# Patient Record
Sex: Male | Born: 1935
Health system: Southern US, Community
[De-identification: ages and names within clinical notes are randomized; demographics above are authoritative.]

## PROBLEM LIST (undated history)

## (undated) DIAGNOSIS — I10 Essential (primary) hypertension: Secondary | ICD-10-CM

## (undated) DIAGNOSIS — C801 Malignant (primary) neoplasm, unspecified: Secondary | ICD-10-CM

## (undated) DIAGNOSIS — F0390 Unspecified dementia without behavioral disturbance: Secondary | ICD-10-CM

## (undated) HISTORY — DX: Malignant (primary) neoplasm, unspecified: C80.1

## (undated) HISTORY — PX: TOTAL KNEE ARTHROPLASTY: SHX125

## (undated) HISTORY — DX: Essential (primary) hypertension: I10

---
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
## 1998-05-18 ENCOUNTER — Encounter: Payer: Self-pay | Admitting: Family Medicine

## 2004-08-11 ENCOUNTER — Ambulatory Visit: Payer: Self-pay | Admitting: Family Medicine

## 2005-08-02 ENCOUNTER — Ambulatory Visit: Payer: Self-pay | Admitting: Family Medicine

## 2005-10-17 ENCOUNTER — Ambulatory Visit: Payer: Self-pay | Admitting: Family Medicine

## 2005-10-19 ENCOUNTER — Ambulatory Visit: Payer: Self-pay | Admitting: Family Medicine

## 2005-11-01 ENCOUNTER — Ambulatory Visit: Payer: Self-pay | Admitting: Family Medicine

## 2006-01-21 ENCOUNTER — Ambulatory Visit: Payer: Self-pay | Admitting: Family Medicine

## 2006-01-21 LAB — CONVERTED CEMR LAB: PSA: 5.58 ng/mL

## 2006-08-08 ENCOUNTER — Ambulatory Visit: Payer: Self-pay | Admitting: Family Medicine

## 2007-07-09 ENCOUNTER — Encounter: Payer: Self-pay | Admitting: Family Medicine

## 2007-07-14 ENCOUNTER — Ambulatory Visit: Payer: Self-pay | Admitting: General Practice

## 2007-07-14 ENCOUNTER — Other Ambulatory Visit: Payer: Self-pay

## 2007-07-28 ENCOUNTER — Encounter: Payer: Self-pay | Admitting: Family Medicine

## 2007-07-28 ENCOUNTER — Inpatient Hospital Stay: Payer: Self-pay | Admitting: General Practice

## 2007-07-30 ENCOUNTER — Encounter: Payer: Self-pay | Admitting: Family Medicine

## 2007-07-30 ENCOUNTER — Other Ambulatory Visit: Payer: Self-pay

## 2007-08-02 ENCOUNTER — Emergency Department: Payer: Self-pay | Admitting: Emergency Medicine

## 2007-08-05 ENCOUNTER — Ambulatory Visit: Payer: Self-pay | Admitting: Family Medicine

## 2007-08-05 DIAGNOSIS — R55 Syncope and collapse: Secondary | ICD-10-CM

## 2008-04-07 ENCOUNTER — Encounter: Payer: Self-pay | Admitting: Family Medicine

## 2008-04-14 ENCOUNTER — Ambulatory Visit: Payer: Self-pay | Admitting: Family Medicine

## 2008-04-14 ENCOUNTER — Encounter: Payer: Self-pay | Admitting: Family Medicine

## 2008-04-14 DIAGNOSIS — Z8546 Personal history of malignant neoplasm of prostate: Secondary | ICD-10-CM

## 2008-04-14 DIAGNOSIS — E7800 Pure hypercholesterolemia, unspecified: Secondary | ICD-10-CM

## 2008-04-14 LAB — CONVERTED CEMR LAB
Alkaline Phosphatase: 62 units/L (ref 39–117)
Bilirubin, Direct: 0.1 mg/dL (ref 0.0–0.3)
Calcium: 9.1 mg/dL (ref 8.4–10.5)
GFR calc Af Amer: 140 mL/min
GFR calc non Af Amer: 116 mL/min
Glucose, Bld: 98 mg/dL (ref 70–99)
HDL: 28.7 mg/dL — ABNORMAL LOW (ref >39.0)
LDL Cholesterol: 110 mg/dL — ABNORMAL HIGH (ref 0–99)
Potassium: 4.1 meq/L (ref 3.5–5.1)
Sodium: 142 meq/L (ref 135–145)
Total Bilirubin: 0.8 mg/dL (ref 0.3–1.2)
Total CHOL/HDL Ratio: 5.5
VLDL: 19 mg/dL (ref 0–40)

## 2008-04-19 ENCOUNTER — Ambulatory Visit: Payer: Self-pay | Admitting: Family Medicine

## 2008-05-06 ENCOUNTER — Ambulatory Visit: Payer: Self-pay | Admitting: Family Medicine

## 2008-05-06 LAB — CONVERTED CEMR LAB
OCCULT 1: NEGATIVE
OCCULT 2: NEGATIVE
OCCULT 3: NEGATIVE

## 2008-05-25 ENCOUNTER — Ambulatory Visit: Payer: Self-pay | Admitting: Family Medicine

## 2008-05-25 DIAGNOSIS — I10 Essential (primary) hypertension: Secondary | ICD-10-CM

## 2008-08-23 ENCOUNTER — Ambulatory Visit: Payer: Self-pay | Admitting: Family Medicine

## 2009-04-28 ENCOUNTER — Encounter: Payer: Self-pay | Admitting: Family Medicine

## 2010-05-09 ENCOUNTER — Encounter (INDEPENDENT_AMBULATORY_CARE_PROVIDER_SITE_OTHER): Payer: Self-pay | Admitting: *Deleted

## 2010-05-25 ENCOUNTER — Encounter: Payer: Self-pay | Admitting: Family Medicine

## 2010-08-14 ENCOUNTER — Emergency Department: Payer: Self-pay | Admitting: Emergency Medicine

## 2010-08-14 ENCOUNTER — Encounter: Payer: Self-pay | Admitting: Family Medicine

## 2010-08-21 ENCOUNTER — Encounter: Payer: Self-pay | Admitting: Family Medicine

## 2010-08-29 ENCOUNTER — Ambulatory Visit: Payer: Self-pay | Admitting: Urology

## 2010-09-15 ENCOUNTER — Encounter: Payer: Self-pay | Admitting: Family Medicine

## 2010-10-09 ENCOUNTER — Ambulatory Visit: Payer: Self-pay | Admitting: Gastroenterology

## 2010-11-01 NOTE — Letter (Signed)
Summary: [facility]   Imported By: Lanelle Bal 08/31/2010 12:01:02  _____________________________________________________________________  External Attachment:    Type:   Image     Comment:   External Document

## 2010-11-01 NOTE — Letter (Signed)
Summary: Nadara Eaton letter  [place] at [facility]  [address] [place], [place] [address]   Phone: [phone]  Fax: [phone]       05/09/2010 MRN: [id number]  ALLEX LAPOINT [address] [place], [place]  [address]  Dear Mr. GREENFELD,  [facility] [place], and [facility] announce the retirement of Arta Silence, M.D., from full-time practice at the [facility] office effective March 30, 2010 and his plans of returning part-time.  It is important to Dr. Hetty Ely and to our practice that you understand that [facility] has seven physicians in our office for your health care needs.  We will continue to offer the same exceptional care that you have today.    Dr. Hetty Ely has spoken to many of you about his plans for retirement and returning part-time in the fall.   We will continue to work with you through the transition to schedule appointments for you in the office and meet the high standards that [place] is committed to.   Again, it is with great pleasure that we share the news that Dr. Hetty Ely will return to [facility] in October of 2011 with a reduced schedule.    If you have any questions, or would like to request an appointment with one of our physicians, please call us at [phone] and press the option for Scheduling an appointment.  We take pleasure in providing you with excellent patient care and look forward to seeing you at your next office visit.  Our [facility] Physicians are:  Tillman Abide, M.D. Laurita Quint, M.D. Roxy Manns, M.D. Kerby Nora, M.D. Hannah Beat, M.D. Ruthe Mannan, M.D. We proudly welcomed Raechel Ache, M.D. and Eustaquio Boyden, M.D. to the practice in July/August 2011.  Sincerely,  [facility]

## 2010-11-01 NOTE — Letter (Signed)
Summary: [facility]  [facility]   Imported By: Lanelle Bal 06/01/2010 12:01:27  _____________________________________________________________________  External Attachment:    Type:   Image     Comment:   External Document  Appended Document: [facility]    Clinical Lists Changes  Observations: Added new observation of PAST MED HX: elevated PSA per Dr. Achilles Dunk with uro (06/01/2010 22:03)       Past History:  Past Medical History: elevated PSA per Dr. Achilles Dunk with uro

## 2010-11-01 NOTE — Letter (Signed)
Summary: [facility]  [facility]   Imported By: Lester Stella 08/23/2010 12:40:31  _____________________________________________________________________  External Attachment:    Type:   Image     Comment:   External Document

## 2010-11-02 NOTE — Letter (Signed)
Summary: [facility]  [facility]   Imported By: Lanelle Bal 09/26/2010 13:07:23  _____________________________________________________________________  External Attachment:    Type:   Image     Comment:   External Document  Appended Document: [facility]    Clinical Lists Changes  Problems: Added new problem of ADENOCARCINOMA, PROSTATE (DR COPE) (ICD-185) Observations: Added new observation of PAST MED HX: elevated PSA per Dr. Achilles Dunk (09/26/2010 20:43) Added new observation of PAST SURG HX: EVENT MONITOR--NORMAL:(10/1998) R KNEE REPLACEMENT (DR HOOTEN) 07/2008 PROSTATE  BIOPSY POS (DR COPE) (08/14/2010) (09/26/2010 20:43)       Past Medical History:    elevated PSA per Dr. Achilles Dunk  Past Surgical History:    EVENT MONITOR--NORMAL:(10/1998)    R KNEE REPLACEMENT (DR HOOTEN) 07/2008    PROSTATE  BIOPSY POS (DR COPE) (08/14/2010)

## 2010-11-28 ENCOUNTER — Ambulatory Visit: Payer: Self-pay | Admitting: Urology

## 2010-11-28 DIAGNOSIS — C801 Malignant (primary) neoplasm, unspecified: Secondary | ICD-10-CM

## 2010-11-28 HISTORY — DX: Malignant (primary) neoplasm, unspecified: C80.1

## 2010-12-04 ENCOUNTER — Encounter: Payer: Self-pay | Admitting: Family Medicine

## 2010-12-12 NOTE — Letter (Signed)
Summary: [facility]  [facility]   Imported By: Maryln Gottron 12/07/2010 15:39:55  _____________________________________________________________________  External Attachment:    Type:   Image     Comment:   External Document

## 2011-01-09 ENCOUNTER — Emergency Department: Payer: Self-pay | Admitting: Internal Medicine

## 2011-01-11 ENCOUNTER — Inpatient Hospital Stay: Payer: Self-pay | Admitting: Internal Medicine

## 2013-07-20 ENCOUNTER — Encounter: Payer: Self-pay | Admitting: Internal Medicine

## 2013-07-20 ENCOUNTER — Ambulatory Visit (INDEPENDENT_AMBULATORY_CARE_PROVIDER_SITE_OTHER): Payer: Medicare Other | Admitting: Internal Medicine

## 2013-07-20 ENCOUNTER — Encounter (INDEPENDENT_AMBULATORY_CARE_PROVIDER_SITE_OTHER): Payer: Self-pay

## 2013-07-20 VITALS — BP 130/60 | HR 65 | Temp 98.0°F | Ht 72.5 in | Wt 176.8 lb

## 2013-07-20 DIAGNOSIS — Z23 Encounter for immunization: Secondary | ICD-10-CM

## 2013-07-20 DIAGNOSIS — E785 Hyperlipidemia, unspecified: Secondary | ICD-10-CM

## 2013-07-20 DIAGNOSIS — C61 Malignant neoplasm of prostate: Secondary | ICD-10-CM

## 2013-07-20 DIAGNOSIS — I10 Essential (primary) hypertension: Secondary | ICD-10-CM

## 2013-07-20 MED ORDER — LISINOPRIL 2.5 MG PO TABS: 2.5000 mg | ORAL_TABLET | Freq: Every day | ORAL | Status: DC

## 2013-07-26 ENCOUNTER — Encounter: Payer: Self-pay | Admitting: Internal Medicine

## 2013-07-26 NOTE — Progress Notes (Signed)
  Subjective:    Patient ID: Carlos Hayden, male    DOB: [birth year], 79 y.o.   MRN: [id number]  HPI 79 year old male with past history of hypertension and prostate cancer who comes in today to follow up on these issues as well as to establish care.  Dr Achilles Dunk is following him for his prostate cancer.  Follows PSA.  Sees him every 6 months.  On a low dose of lisinopril.   Blood pressure is doing well.  See attached list.  Stays active.  No chest pain or tightness with increased activity or exertion.  Breathing stable.  Bowels stable.  Overall he feels he is doing well.     Past Medical History  Diagnosis Date  . Cancer 11/28/10    Prostate  . Hypertension     Outpatient Encounter Prescriptions as of 07/20/2013  Medication Sig Dispense Refill  . lisinopril (PRINIVIL,ZESTRIL) 2.5 MG tablet Take 1 tablet (2.5 mg total) by mouth daily.  90 tablet  1  . Multiple Vitamin (MULTIVITAMIN) tablet Take 1 tablet by mouth daily.      . psyllium (METAMUCIL) 58.6 % powder Take 1 packet by mouth daily.      . [DISCONTINUED] lisinopril (PRINIVIL,ZESTRIL) 2.5 MG tablet Take 2.5 mg by mouth daily.       No facility-administered encounter medications on file as of 07/20/2013.    Review of Systems Patient denies any headache, lightheadedness or dizziness.  No sinus or allergy symptoms.  No chest pain, tightness or palpitations.  No increased shortness of breath, cough or congestion.  No nausea or vomiting. No acid reflux.   No abdominal pain or cramping.  No bowel change, such as diarrhea, constipation, BRBPR or melana.  No urine change.   Followed by Dr Achilles Dunk for his history of prostate cancer.  Doing well.       Objective:   Physical Exam Filed Vitals:   07/20/13 1026  BP: 130/60  Pulse: 65  Temp: 98 F (13.58 C)   79 year old male in no acute distress.   HEENT:  Nares- clear.  Oropharynx - without lesions. NECK:  Supple.  Nontender.  No audible bruit.  HEART:  Appears to be regular. LUNGS:  No  crackles or wheezing audible.  Respirations even and unlabored.  RADIAL PULSE:  Equal bilaterally.  ABDOMEN:  Soft, nontender.  Bowel sounds present and normal.  No audible abdominal bruit.    EXTREMITIES:  No increased edema present.  DP pulses palpable and equal bilaterally.          Assessment & Plan:  HEALTH MAINTENANCE.  Dr Achilles Dunk is following him for his history of prostate cancer.  Last colonoscopy 2011.  Obtain records to review.    I spent 45 minutes with the patient and more than 50% of the time was spent in consultation regarding the above.

## 2013-07-26 NOTE — Assessment & Plan Note (Signed)
Followed by Dr Cope.  He follows PSA.  Seeing him q 6 months.  Doing well.    

## 2013-07-26 NOTE — Assessment & Plan Note (Signed)
Low cholesterol diet.  Follow lipid profile.    

## 2013-07-26 NOTE — Assessment & Plan Note (Signed)
Blood pressure is doing well.  Follow metabolic panel.   

## 2013-07-30 ENCOUNTER — Other Ambulatory Visit (INDEPENDENT_AMBULATORY_CARE_PROVIDER_SITE_OTHER): Payer: Medicare Other

## 2013-07-30 DIAGNOSIS — E785 Hyperlipidemia, unspecified: Secondary | ICD-10-CM

## 2013-07-30 DIAGNOSIS — C61 Malignant neoplasm of prostate: Secondary | ICD-10-CM

## 2013-07-30 DIAGNOSIS — I10 Essential (primary) hypertension: Secondary | ICD-10-CM

## 2013-07-30 LAB — CBC WITH DIFFERENTIAL/PLATELET
Basophils Absolute: 0 10*3/uL (ref 0.0–0.1)
Eosinophils Absolute: 0.2 10*3/uL (ref 0.0–0.7)
Eosinophils Relative: 2.6 % (ref 0.0–5.0)
HCT: 43.4 % (ref 39.0–52.0)
MCHC: 34.1 g/dL (ref 30.0–36.0)
MCV: 96.4 fl (ref 78.0–100.0)
Monocytes Absolute: 0.4 10*3/uL (ref 0.1–1.0)
Neutro Abs: 3.8 10*3/uL (ref 1.4–7.7)
Neutrophils Relative %: 63.6 % (ref 43.0–77.0)
Platelets: 161 10*3/uL (ref 150.0–400.0)
RBC: 4.5 Mil/uL (ref 4.22–5.81)
RDW: 12.7 % (ref 11.5–14.6)

## 2013-07-30 LAB — COMPREHENSIVE METABOLIC PANEL
AST: 18 U/L (ref 0–37)
Albumin: 3.9 g/dL (ref 3.5–5.2)
Alkaline Phosphatase: 61 U/L (ref 39–117)
BUN: 18 mg/dL (ref 6–23)
Creatinine, Ser: 0.8 mg/dL (ref 0.4–1.5)
Glucose, Bld: 97 mg/dL (ref 70–99)
Potassium: 4.2 mEq/L (ref 3.5–5.1)
Sodium: 142 mEq/L (ref 135–145)
Total Bilirubin: 0.8 mg/dL (ref 0.3–1.2)
Total Protein: 6.8 g/dL (ref 6.0–8.3)

## 2013-07-30 LAB — LIPID PANEL
Cholesterol: 159 mg/dL (ref 0–200)
HDL: 32.6 mg/dL — ABNORMAL LOW (ref >39.00)
LDL Cholesterol: 107 mg/dL — ABNORMAL HIGH (ref 0–99)
VLDL: 19.2 mg/dL (ref 0.0–40.0)

## 2013-07-31 ENCOUNTER — Encounter: Payer: Self-pay | Admitting: *Deleted

## 2013-11-23 ENCOUNTER — Ambulatory Visit: Payer: Medicare Other | Admitting: Internal Medicine

## 2013-12-01 ENCOUNTER — Ambulatory Visit: Payer: Medicare HMO | Admitting: Internal Medicine

## 2013-12-03 ENCOUNTER — Ambulatory Visit (INDEPENDENT_AMBULATORY_CARE_PROVIDER_SITE_OTHER): Payer: Medicare HMO | Admitting: Internal Medicine

## 2013-12-03 ENCOUNTER — Encounter: Payer: Self-pay | Admitting: Internal Medicine

## 2013-12-03 VITALS — BP 138/70 | HR 60 | Temp 98.1°F | Ht 72.5 in | Wt 178.5 lb

## 2013-12-03 DIAGNOSIS — E785 Hyperlipidemia, unspecified: Secondary | ICD-10-CM

## 2013-12-03 DIAGNOSIS — C61 Malignant neoplasm of prostate: Secondary | ICD-10-CM

## 2013-12-03 DIAGNOSIS — I10 Essential (primary) hypertension: Secondary | ICD-10-CM

## 2013-12-03 DIAGNOSIS — Z23 Encounter for immunization: Secondary | ICD-10-CM

## 2013-12-03 NOTE — Progress Notes (Signed)
Pre-visit discussion using our clinic review tool. No additional management support is needed unless otherwise documented below in the visit note.  

## 2013-12-04 LAB — BASIC METABOLIC PANEL
BUN: 23 mg/dL (ref 6–23)
CALCIUM: 9.3 mg/dL (ref 8.4–10.5)
CHLORIDE: 104 meq/L (ref 96–112)
CO2: 28 meq/L (ref 19–32)
CREATININE: 0.8 mg/dL (ref 0.4–1.5)
GFR: 100.4 mL/min (ref >60.00)
GLUCOSE: 83 mg/dL (ref 70–99)
Potassium: 4.3 mEq/L (ref 3.5–5.1)
SODIUM: 140 meq/L (ref 135–145)

## 2013-12-06 ENCOUNTER — Encounter: Payer: Self-pay | Admitting: Internal Medicine

## 2013-12-06 NOTE — Assessment & Plan Note (Signed)
Followed by Dr Cope.  He follows PSA.  Seeing him q 6 months.  Doing well.    

## 2013-12-06 NOTE — Assessment & Plan Note (Signed)
Low cholesterol diet.  Follow lipid profile.    

## 2013-12-06 NOTE — Assessment & Plan Note (Signed)
Blood pressure is doing well.  Follow metabolic panel.   

## 2013-12-06 NOTE — Progress Notes (Signed)
  Subjective:    Patient ID: Carlos Hayden, male    DOB: [birth year], 80 y.o.   MRN: [id number]  HPI 80 year old male with past history of hypertension and prostate cancer who comes in today for a schedule follow up.   Dr Jacqlyn Larsen is following him for his prostate cancer.  Follows PSA.  Sees him every 6 months.  On a low dose of lisinopril.   Blood pressure is doing well.  Stays active.  No chest pain or tightness with increased activity or exertion.  Breathing stable.  Bowels stable.  Overall he feels he is doing well.     Past Medical History  Diagnosis Date  . Cancer 11/28/10    Prostate  . Hypertension     Outpatient Encounter Prescriptions as of 12/03/2013  Medication Sig  . lisinopril (PRINIVIL,ZESTRIL) 2.5 MG tablet Take 1 tablet (2.5 mg total) by mouth daily.  . Multiple Vitamin (MULTIVITAMIN) tablet Take 1 tablet by mouth daily.  . psyllium (METAMUCIL) 58.6 % powder Take 1 packet by mouth daily.    Review of Systems Patient denies any headache, lightheadedness or dizziness.  No sinus or allergy symptoms.  No chest pain, tightness or palpitations.  No increased shortness of breath, cough or congestion.  No nausea or vomiting. No acid reflux.   No abdominal pain or cramping.  No bowel change, such as diarrhea, constipation, BRBPR or melana.  No urine change.   Followed by Dr Jacqlyn Larsen for his history of prostate cancer.  Doing well.       Objective:   Physical Exam  Filed Vitals:   12/03/13 1450  BP: 138/70  Pulse: 60  Temp: 98.1 F (36.7 C)   Blood pressure recheck:  58/3  80 year old male in no acute distress.   HEENT:  Nares- clear.  Oropharynx - without lesions. NECK:  Supple.  Nontender.  No audible bruit.  HEART:  Appears to be regular. LUNGS:  No crackles or wheezing audible.  Respirations even and unlabored.  RADIAL PULSE:  Equal bilaterally.  ABDOMEN:  Soft, nontender.  Bowel sounds present and normal.  No audible abdominal bruit.    EXTREMITIES:  No increased  edema present.  DP pulses palpable and equal bilaterally.          Assessment & Plan:  HEALTH MAINTENANCE.  Dr Jacqlyn Larsen is following him for his history of prostate cancer.  Last colonoscopy 2011.  Obtain records to review.    I spent 25 minutes with the patient and more than 50% of the time was spent in consultation regarding the above.

## 2013-12-07 ENCOUNTER — Encounter: Payer: Self-pay | Admitting: *Deleted

## 2014-04-20 ENCOUNTER — Encounter: Payer: Self-pay | Admitting: Internal Medicine

## 2014-04-20 ENCOUNTER — Ambulatory Visit (INDEPENDENT_AMBULATORY_CARE_PROVIDER_SITE_OTHER): Payer: Medicare HMO | Admitting: Internal Medicine

## 2014-04-20 VITALS — BP 126/58 | HR 54 | Temp 98.0°F | Ht 72.75 in | Wt 169.5 lb

## 2014-04-20 DIAGNOSIS — I10 Essential (primary) hypertension: Secondary | ICD-10-CM

## 2014-04-20 DIAGNOSIS — Z0000 Encounter for general adult medical examination without abnormal findings: Secondary | ICD-10-CM

## 2014-04-20 DIAGNOSIS — E785 Hyperlipidemia, unspecified: Secondary | ICD-10-CM

## 2014-04-20 DIAGNOSIS — C61 Malignant neoplasm of prostate: Secondary | ICD-10-CM

## 2014-04-20 NOTE — Progress Notes (Signed)
Pre visit review using our clinic review tool, if applicable. No additional management support is needed unless otherwise documented below in the visit note. 

## 2014-04-25 ENCOUNTER — Encounter: Payer: Self-pay | Admitting: Internal Medicine

## 2014-04-25 NOTE — Assessment & Plan Note (Signed)
Blood pressure is doing well.  Follow metabolic panel.   

## 2014-04-25 NOTE — Assessment & Plan Note (Signed)
Followed by Dr Jacqlyn Larsen.  He follows PSA.  Seeing him q 6 months.  Doing well.

## 2014-04-25 NOTE — Progress Notes (Signed)
  Subjective:    Patient ID: Carlos Hayden, male    DOB: 03-27-1926, 80 y.o.   MRN: [id number]  HPI 80 year old male with past history of hypertension and prostate cancer who comes in today to follow up on these issues as well as for a complete physical exam.   Dr Jacqlyn Larsen is following him for his prostate cancer.  Follows PSA.  Sees him every 6 months.  On a low dose of lisinopril.   Blood pressure is doing well.  Stays active.  No chest pain or tightness with increased activity or exertion.  Breathing stable.  Bowels stable.  Overall he feels he is doing well.     Past Medical History  Diagnosis Date  . Cancer 11/28/10    Prostate  . Hypertension     Outpatient Encounter Prescriptions as of 04/20/2014  Medication Sig  . lisinopril (PRINIVIL,ZESTRIL) 2.5 MG tablet Take 1 tablet (2.5 mg total) by mouth daily.  . Multiple Vitamin (MULTIVITAMIN) tablet Take 1 tablet by mouth daily.  . psyllium (METAMUCIL) 58.6 % powder Take 1 packet by mouth daily.    Review of Systems Patient denies any headache, lightheadedness or dizziness.  No sinus or allergy symptoms.  No chest pain, tightness or palpitations.  No increased shortness of breath, cough or congestion.  No nausea or vomiting. No acid reflux.   No abdominal pain or cramping.  No bowel change, such as diarrhea, constipation, BRBPR or melana.  No urine change.   Followed by Dr Jacqlyn Larsen for his history of prostate cancer.  Doing well.       Objective:   Physical Exam  Filed Vitals:   04/20/14 1033  BP: 126/58  Pulse: 54  Temp: 98 F (36.7 C)   Blood pressure recheck:  63/26  80 year old male in no acute distress.  HEENT:  Nares - clear.  Oropharynx - without lesions. NECK:  Supple.  Nontender.  No audible carotid bruit.  HEART:  Appears to be regular.   LUNGS:  No crackles or wheezing audible.  Respirations even and unlabored.   RADIAL PULSE:  Equal bilaterally.  ABDOMEN:  Soft.  Nontender.  Bowel sounds present and normal.  No  audible abdominal bruit.  GU:  Performed by urology.    EXTREMITIES:  No increased edema present.  DP pulses palpable and equal bilaterally.      Assessment & Plan:  HEALTH MAINTENANCE.  Dr Jacqlyn Larsen is following him for his history of prostate cancer.  Physical today.  Last colonoscopy 2011.  Obtain records to review.    I spent 25 minutes with the patient and more than 50% of the time was spent in consultation regarding the above.

## 2014-04-25 NOTE — Assessment & Plan Note (Signed)
Low cholesterol diet.  Follow lipid profile.

## 2014-04-26 ENCOUNTER — Other Ambulatory Visit (INDEPENDENT_AMBULATORY_CARE_PROVIDER_SITE_OTHER): Payer: Medicare HMO

## 2014-04-26 DIAGNOSIS — E785 Hyperlipidemia, unspecified: Secondary | ICD-10-CM

## 2014-04-26 DIAGNOSIS — I10 Essential (primary) hypertension: Secondary | ICD-10-CM

## 2014-04-26 LAB — LIPID PANEL
Cholesterol: 149 mg/dL (ref 0–200)
HDL: 33.7 mg/dL — ABNORMAL LOW (ref >39.00)
LDL Cholesterol: 102 mg/dL — ABNORMAL HIGH (ref 0–99)
NonHDL: 115.3
Total CHOL/HDL Ratio: 4
Triglycerides: 68 mg/dL (ref 0.0–149.0)
VLDL: 13.6 mg/dL (ref 0.0–40.0)

## 2014-04-26 LAB — COMPREHENSIVE METABOLIC PANEL
ALT: 11 U/L (ref 0–53)
AST: 17 U/L (ref 0–37)
Albumin: 3.7 g/dL (ref 3.5–5.2)
Alkaline Phosphatase: 64 U/L (ref 39–117)
BUN: 21 mg/dL (ref 6–23)
CALCIUM: 8.9 mg/dL (ref 8.4–10.5)
CO2: 29 meq/L (ref 19–32)
Chloride: 108 mEq/L (ref 96–112)
Creatinine, Ser: 0.7 mg/dL (ref 0.4–1.5)
GFR: 119.54 mL/min (ref >60.00)
GLUCOSE: 98 mg/dL (ref 70–99)
Potassium: 4.4 mEq/L (ref 3.5–5.1)
SODIUM: 143 meq/L (ref 135–145)
TOTAL PROTEIN: 6.5 g/dL (ref 6.0–8.3)
Total Bilirubin: 0.6 mg/dL (ref 0.2–1.2)

## 2014-04-27 ENCOUNTER — Encounter: Payer: Self-pay | Admitting: *Deleted

## 2014-06-10 ENCOUNTER — Ambulatory Visit (INDEPENDENT_AMBULATORY_CARE_PROVIDER_SITE_OTHER): Payer: Medicare HMO

## 2014-06-10 DIAGNOSIS — Z23 Encounter for immunization: Secondary | ICD-10-CM

## 2014-06-21 ENCOUNTER — Other Ambulatory Visit: Payer: Self-pay | Admitting: Internal Medicine

## 2014-07-07 ENCOUNTER — Encounter: Payer: Self-pay | Admitting: Internal Medicine

## 2014-07-07 ENCOUNTER — Ambulatory Visit (INDEPENDENT_AMBULATORY_CARE_PROVIDER_SITE_OTHER): Payer: Medicare HMO | Admitting: Internal Medicine

## 2014-07-07 VITALS — BP 120/58 | HR 59 | Temp 98.2°F | Ht 72.75 in | Wt 169.5 lb

## 2014-07-07 DIAGNOSIS — I10 Essential (primary) hypertension: Secondary | ICD-10-CM

## 2014-07-07 DIAGNOSIS — E7800 Pure hypercholesterolemia, unspecified: Secondary | ICD-10-CM

## 2014-07-07 DIAGNOSIS — R2681 Unsteadiness on feet: Secondary | ICD-10-CM

## 2014-07-07 DIAGNOSIS — C61 Malignant neoplasm of prostate: Secondary | ICD-10-CM

## 2014-07-07 NOTE — Progress Notes (Signed)
Pre visit review using our clinic review tool, if applicable. No additional management support is needed unless otherwise documented below in the visit note. 

## 2014-07-11 ENCOUNTER — Encounter: Payer: Self-pay | Admitting: Internal Medicine

## 2014-07-11 DIAGNOSIS — R2681 Unsteadiness on feet: Secondary | ICD-10-CM | POA: Insufficient documentation

## 2014-07-11 NOTE — Assessment & Plan Note (Signed)
Followed by Dr Jacqlyn Larsen.  He follows PSA.  Seeing him q 6 months.  Doing well.

## 2014-07-11 NOTE — Progress Notes (Signed)
  Subjective:    Patient ID: Carlos Hayden, male    DOB: 07-27-1926, 80 y.o.   MRN: [id number]  HPI 80 year old male with past history of hypertension and prostate cancer who comes in today for a scheduled follow up.   Dr Jacqlyn Larsen is following him for his prostate cancer.  Follows PSA.  Sees him every 6 months.  On a low dose of lisinopril.   Blood pressure is doing well.  Stays active.  No chest pain or tightness with increased activity or exertion.  Breathing stable.  Bowels stable.  Overall he feels he is doing relatively well.  Does report unsteady gait.  No falls.  We discussed physical therapy.     Past Medical History  Diagnosis Date  . Cancer 11/28/10    Prostate  . Hypertension     Outpatient Encounter Prescriptions as of 07/07/2014  Medication Sig  . lisinopril (PRINIVIL,ZESTRIL) 2.5 MG tablet TAKE ONE (1) TABLET EACH DAY  . Multiple Vitamin (MULTIVITAMIN) tablet Take 1 tablet by mouth daily.  . psyllium (METAMUCIL) 58.6 % powder Take 1 packet by mouth daily.    Review of Systems Patient denies any headache, lightheadedness or dizziness.  No sinus or allergy symptoms.  No chest pain, tightness or palpitations.  No increased shortness of breath, cough or congestion.  No nausea or vomiting.  No acid reflux.   No abdominal pain or cramping.  No bowel change, such as diarrhea, constipation, BRBPR or melana.  No urine change.   Followed by Dr Jacqlyn Larsen for his history of prostate cancer.  Doing well.  Unsteady gait as outlined.       Objective:   Physical Exam  Filed Vitals:   07/07/14 1023  BP: 120/58  Pulse: 59  Temp: 98.2 F (107.17 C)   80 year old male in no acute distress.  HEENT:  Nares - clear.  Oropharynx - without lesions. NECK:  Supple.  Nontender.  No audible carotid bruit.  HEART:  Appears to be regular.   LUNGS:  No crackles or wheezing audible.  Respirations even and unlabored.   RADIAL PULSE:  Equal bilaterally.  ABDOMEN:  Soft.  Nontender.  Bowel sounds present  and normal.  No audible abdominal bruit.   EXTREMITIES:  No increased edema present.  DP pulses palpable and equal bilaterally.      Assessment & Plan:  HEALTH MAINTENANCE.  Dr Jacqlyn Larsen is following him for his history of prostate cancer.  Physical 04/20/14.   Last colonoscopy 2011.

## 2014-07-11 NOTE — Assessment & Plan Note (Signed)
Low cholesterol diet.  Follow lipid profile.

## 2014-07-11 NOTE — Assessment & Plan Note (Signed)
No falls.  Just feels more unsteady with walking.  Will refer to physical therapy for further evaluation and treatment.

## 2014-07-11 NOTE — Assessment & Plan Note (Signed)
Blood pressure is doing well.  Follow metabolic panel.   

## 2014-09-02 ENCOUNTER — Other Ambulatory Visit: Payer: Medicare HMO

## 2014-09-03 ENCOUNTER — Other Ambulatory Visit (INDEPENDENT_AMBULATORY_CARE_PROVIDER_SITE_OTHER): Payer: Medicare HMO

## 2014-09-03 DIAGNOSIS — E7800 Pure hypercholesterolemia, unspecified: Secondary | ICD-10-CM

## 2014-09-03 DIAGNOSIS — I10 Essential (primary) hypertension: Secondary | ICD-10-CM

## 2014-09-03 DIAGNOSIS — R2681 Unsteadiness on feet: Secondary | ICD-10-CM

## 2014-09-03 DIAGNOSIS — C61 Malignant neoplasm of prostate: Secondary | ICD-10-CM

## 2014-09-05 LAB — CBC WITH DIFFERENTIAL/PLATELET
BASOS PCT: 0.2 % (ref 0.0–3.0)
Basophils Absolute: 0 10*3/uL (ref 0.0–0.1)
EOS PCT: 2.7 % (ref 0.0–5.0)
Eosinophils Absolute: 0.2 10*3/uL (ref 0.0–0.7)
HEMATOCRIT: 43.8 % (ref 39.0–52.0)
HEMOGLOBIN: 14.5 g/dL (ref 13.0–17.0)
Lymphocytes Relative: 12.7 % (ref 12.0–46.0)
Lymphs Abs: 1 10*3/uL (ref 0.7–4.0)
MCHC: 33.1 g/dL (ref 30.0–36.0)
MCV: 96.7 fl (ref 78.0–100.0)
MONO ABS: 0.4 10*3/uL (ref 0.1–1.0)
Monocytes Relative: 5.5 % (ref 3.0–12.0)
Neutro Abs: 6 10*3/uL (ref 1.4–7.7)
Neutrophils Relative %: 78.9 % — ABNORMAL HIGH (ref 43.0–77.0)
Platelets: 142 10*3/uL — ABNORMAL LOW (ref 150.0–400.0)
RBC: 4.53 Mil/uL (ref 4.22–5.81)
RDW: 12.9 % (ref 11.5–15.5)
WBC: 7.7 10*3/uL (ref 4.0–10.5)

## 2014-09-05 LAB — TSH: TSH: 3.23 u[IU]/mL (ref 0.35–4.50)

## 2014-09-05 LAB — COMPREHENSIVE METABOLIC PANEL
ALBUMIN: 3.8 g/dL (ref 3.5–5.2)
ALT: 13 U/L (ref 0–53)
AST: 19 U/L (ref 0–37)
Alkaline Phosphatase: 61 U/L (ref 39–117)
BILIRUBIN TOTAL: 0.8 mg/dL (ref 0.2–1.2)
BUN: 23 mg/dL (ref 6–23)
CO2: 29 meq/L (ref 19–32)
Calcium: 8.8 mg/dL (ref 8.4–10.5)
Chloride: 105 mEq/L (ref 96–112)
Creatinine, Ser: 0.9 mg/dL (ref 0.4–1.5)
GFR: 83.89 mL/min (ref >60.00)
GLUCOSE: 136 mg/dL — AB (ref 70–99)
Potassium: 4.4 mEq/L (ref 3.5–5.1)
SODIUM: 141 meq/L (ref 135–145)
TOTAL PROTEIN: 6.4 g/dL (ref 6.0–8.3)

## 2014-09-05 LAB — LIPID PANEL
Cholesterol: 160 mg/dL (ref 0–200)
HDL: 30.6 mg/dL — AB (ref >39.00)
LDL Cholesterol: 111 mg/dL — ABNORMAL HIGH (ref 0–99)
NONHDL: 129.4
Total CHOL/HDL Ratio: 5
Triglycerides: 93 mg/dL (ref 0.0–149.0)
VLDL: 18.6 mg/dL (ref 0.0–40.0)

## 2014-09-05 LAB — VITAMIN B12: VITAMIN B 12: 269 pg/mL (ref 211–911)

## 2014-09-08 ENCOUNTER — Other Ambulatory Visit: Payer: Self-pay | Admitting: Internal Medicine

## 2014-09-08 ENCOUNTER — Encounter: Payer: Self-pay | Admitting: *Deleted

## 2014-09-08 DIAGNOSIS — D696 Thrombocytopenia, unspecified: Secondary | ICD-10-CM

## 2014-09-08 DIAGNOSIS — R739 Hyperglycemia, unspecified: Secondary | ICD-10-CM

## 2014-09-08 NOTE — Progress Notes (Signed)
Orders placed for f/u labs.  

## 2014-09-20 ENCOUNTER — Other Ambulatory Visit (INDEPENDENT_AMBULATORY_CARE_PROVIDER_SITE_OTHER): Payer: Medicare HMO

## 2014-09-20 DIAGNOSIS — R739 Hyperglycemia, unspecified: Secondary | ICD-10-CM

## 2014-09-20 DIAGNOSIS — D696 Thrombocytopenia, unspecified: Secondary | ICD-10-CM

## 2014-09-20 LAB — PLATELET COUNT: PLATELETS: 167 10*3/uL (ref 150–400)

## 2014-09-20 LAB — HEMOGLOBIN A1C: Hgb A1c MFr Bld: 5.5 % (ref 4.6–6.5)

## 2014-09-21 ENCOUNTER — Encounter: Payer: Self-pay | Admitting: *Deleted

## 2014-10-26 ENCOUNTER — Encounter (HOSPITAL_COMMUNITY): Payer: Self-pay | Admitting: General Practice

## 2014-10-26 ENCOUNTER — Inpatient Hospital Stay (HOSPITAL_COMMUNITY)
Admission: AD | Admit: 2014-10-26 | Discharge: 2014-10-27 | DRG: 087 | Disposition: A | Discharge status: Home / Self Care | Payer: Medicare HMO | Source: Other Acute Inpatient Hospital | Attending: Neurosurgery | Admitting: Neurosurgery

## 2014-10-26 ENCOUNTER — Emergency Department: Payer: Self-pay | Admitting: Emergency Medicine

## 2014-10-26 DIAGNOSIS — Z87891 Personal history of nicotine dependence: Secondary | ICD-10-CM | POA: Diagnosis not present

## 2014-10-26 DIAGNOSIS — Z96651 Presence of right artificial knee joint: Secondary | ICD-10-CM | POA: Diagnosis not present

## 2014-10-26 DIAGNOSIS — I609 Nontraumatic subarachnoid hemorrhage, unspecified: Secondary | ICD-10-CM

## 2014-10-26 DIAGNOSIS — W010XXA Fall on same level from slipping, tripping and stumbling without subsequent striking against object, initial encounter: Secondary | ICD-10-CM | POA: Diagnosis present

## 2014-10-26 DIAGNOSIS — S066X0A Traumatic subarachnoid hemorrhage without loss of consciousness, initial encounter: Principal | ICD-10-CM | POA: Diagnosis present

## 2014-10-26 DIAGNOSIS — I10 Essential (primary) hypertension: Secondary | ICD-10-CM | POA: Diagnosis not present

## 2014-10-26 DIAGNOSIS — S066XAA Traumatic subarachnoid hemorrhage with loss of consciousness status unknown, initial encounter: Secondary | ICD-10-CM

## 2014-10-26 DIAGNOSIS — S066X9A Traumatic subarachnoid hemorrhage with loss of consciousness of unspecified duration, initial encounter: Secondary | ICD-10-CM | POA: Diagnosis present

## 2014-10-26 LAB — CBC WITH DIFFERENTIAL/PLATELET
Basophil #: 0 10*3/uL (ref 0.0–0.1)
Basophil %: 0.5 %
EOS ABS: 0.1 10*3/uL (ref 0.0–0.7)
EOS PCT: 0.8 %
HCT: 43 % (ref 40.0–52.0)
HGB: 14.6 g/dL (ref 13.0–18.0)
LYMPHS ABS: 0.8 10*3/uL — AB (ref 1.0–3.6)
Lymphocyte %: 8.4 %
MCH: 32.3 pg (ref 26.0–34.0)
MCHC: 33.9 g/dL (ref 32.0–36.0)
MCV: 95 fL (ref 80–100)
MONO ABS: 0.6 x10 3/mm (ref 0.2–1.0)
Monocyte %: 6.7 %
NEUTROS PCT: 83.6 %
Neutrophil #: 7.6 10*3/uL — ABNORMAL HIGH (ref 1.4–6.5)
PLATELETS: 139 10*3/uL — AB (ref 150–440)
RBC: 4.51 10*6/uL (ref 4.40–5.90)
RDW: 12.6 % (ref 11.5–14.5)
WBC: 9.1 10*3/uL (ref 3.8–10.6)

## 2014-10-26 LAB — BASIC METABOLIC PANEL
ANION GAP: 7 (ref 7–16)
BUN: 24 mg/dL — ABNORMAL HIGH (ref 7–18)
CHLORIDE: 105 mmol/L (ref 98–107)
CREATININE: 0.88 mg/dL (ref 0.60–1.30)
Calcium, Total: 8.7 mg/dL (ref 8.5–10.1)
Co2: 30 mmol/L (ref 21–32)
EGFR (African American): >60
EGFR (Non-African Amer.): >60
GLUCOSE: 91 mg/dL (ref 65–99)
Osmolality: 287 (ref 275–301)
POTASSIUM: 4.2 mmol/L (ref 3.5–5.1)
Sodium: 142 mmol/L (ref 136–145)

## 2014-10-26 LAB — APTT: ACTIVATED PTT: 26.2 s (ref 23.6–35.9)

## 2014-10-26 LAB — PROTIME-INR
INR: 1.1
Prothrombin Time: 13.9 secs (ref 11.5–14.7)

## 2014-10-26 MED ORDER — POTASSIUM CHLORIDE IN NACL 20-0.9 MEQ/L-% IV SOLN
INTRAVENOUS | Status: DC
  Filled 2014-10-26: qty 1000

## 2014-10-26 MED ORDER — LISINOPRIL 2.5 MG PO TABS
2.5000 mg | ORAL_TABLET | Freq: Every day | ORAL | Status: DC
  Administered 2014-10-26 – 2014-10-27 (×2): 2.5 mg via ORAL
  Filled 2014-10-26 (×2): qty 1

## 2014-10-26 MED ORDER — SENNA 8.6 MG PO TABS
1.0000 | ORAL_TABLET | Freq: Two times a day (BID) | ORAL | Status: DC
  Administered 2014-10-26 – 2014-10-27 (×2): 8.6 mg via ORAL
  Filled 2014-10-26 (×2): qty 1

## 2014-10-26 MED ORDER — BISACODYL 10 MG RE SUPP: 10.0000 mg | Freq: Every day | RECTAL | Status: DC | PRN

## 2014-10-26 MED ORDER — ONDANSETRON HCL 4 MG/2ML IJ SOLN: 4.0000 mg | Freq: Four times a day (QID) | INTRAMUSCULAR | Status: DC | PRN

## 2014-10-26 MED ORDER — SENNOSIDES-DOCUSATE SODIUM 8.6-50 MG PO TABS: 1.0000 | ORAL_TABLET | Freq: Every evening | ORAL | Status: DC | PRN

## 2014-10-26 MED ORDER — FLEET ENEMA 7-19 GM/118ML RE ENEM: 1.0000 | ENEMA | Freq: Once | RECTAL | Status: AC | PRN

## 2014-10-26 MED ORDER — ADULT MULTIVITAMIN W/MINERALS CH
1.0000 | ORAL_TABLET | Freq: Every day | ORAL | Status: DC
Start: 2014-10-26 — End: 2014-10-27
  Administered 2014-10-26 – 2014-10-27 (×2): 1 via ORAL
  Filled 2014-10-26 (×3): qty 1

## 2014-10-26 MED ORDER — PSYLLIUM 95 % PO PACK
1.0000 | PACK | Freq: Every day | ORAL | Status: DC
  Filled 2014-10-26 (×2): qty 1

## 2014-10-26 MED ORDER — ONDANSETRON HCL 4 MG PO TABS: 4.0000 mg | ORAL_TABLET | Freq: Four times a day (QID) | ORAL | Status: DC | PRN

## 2014-10-26 MED ORDER — ALUM & MAG HYDROXIDE-SIMETH 200-200-20 MG/5ML PO SUSP: 30.0000 mL | Freq: Four times a day (QID) | ORAL | Status: DC | PRN

## 2014-10-26 MED ORDER — DOCUSATE SODIUM 100 MG PO CAPS
100.0000 mg | ORAL_CAPSULE | Freq: Two times a day (BID) | ORAL | Status: DC
  Administered 2014-10-26 – 2014-10-27 (×2): 100 mg via ORAL
  Filled 2014-10-26 (×2): qty 1

## 2014-10-26 MED ORDER — HYDROCODONE-ACETAMINOPHEN 5-325 MG PO TABS: 1.0000 | ORAL_TABLET | ORAL | Status: DC | PRN

## 2014-10-26 NOTE — H&P (Signed)
Reason for Admission:subarachnoid hemorrhage after fall Referring Physician: Dr. Freddrick March Carlos Hayden. is an 81 y.o. male.  HPI: Patient slipped and fell and hit his head.  Amnestic for injury.  Taken to [facility].  Head CT shows small amount left parietal SAH.  Tx to [facility] for care.  Past Medical History  Diagnosis Date  . Cancer 11/28/10    Prostate  . Hypertension     Past Surgical History  Procedure Laterality Date  . Tonsillectomy  1930's  . Total knee arthroplasty      right    Family History  Problem Relation Age of Onset  . Prostate cancer Father   . Lung cancer Mother     Social History:  reports that he has quit smoking. He has never used smokeless tobacco. He reports that he does not drink alcohol or use illicit drugs.  Allergies: No Known Allergies  Medications: I have reviewed the patient's current medications.  No results found for this or any previous visit (from the past 48 hour(s)).  No results found.  Review of Systems - Negative except Prostatectomy, HTN, right knee replacement    Blood pressure 143/74, pulse 78, temperature 97.7 F (36.5 C), temperature source Axillary, resp. rate 16, SpO2 90 %. Physical Exam   Awake, alert, conversant.  Chest CTA.  Cor RRR.  Abd soft, NT.  C/T/L spine nontender.  Small bruise to occiput.  PERRL, EOMI.  MAEW without drift and full power.  Reflexes symmetric.  Assessment/Plan: Admit with SAH after fall.  Observe, repeat CT in AM.  [name] D, MD 10/26/2014, 5:18 PM

## 2014-10-27 ENCOUNTER — Inpatient Hospital Stay (HOSPITAL_COMMUNITY): Payer: Medicare HMO

## 2014-10-27 ENCOUNTER — Encounter (HOSPITAL_COMMUNITY): Payer: Self-pay | Admitting: *Deleted

## 2014-10-27 NOTE — Discharge Summary (Signed)
Physician Discharge Summary  Patient ID: Carlos Hayden. MRN: [id number] DOB/AGE: 12/11/1925 81 y.o.  Admit date: 10/26/2014 Discharge date: 10/27/2014  Admission Diagnoses:head injury s/p fall with subarachnoid hemorrhage  Discharge Diagnoses: head injury s/p fall with subarachnoid hemorrhage Active Problems:   Subarachnoid hemorrhage   Subarachnoid hematoma   Discharged Condition: good  Hospital Course: Observed.  Repeat head CT improved  Consults: None  Significant Diagnostic Studies: None  Treatments: Observation with neuro checks  Discharge Exam: Blood pressure 123/54, pulse 56, temperature 98 F (36.7 C), temperature source Oral, resp. rate 16, SpO2 97 %. Neurologic: Alert and oriented X 3, normal strength and tone. Normal symmetric reflexes. Normal coordination and gait  Disposition: Home     Medication List    TAKE these medications        lisinopril 2.5 MG tablet  Commonly known as:  PRINIVIL,ZESTRIL  TAKE ONE (1) TABLET EACH DAY     multivitamin tablet  Take 1 tablet by mouth daily.     psyllium 58.6 % powder  Commonly known as:  METAMUCIL  Take 1 packet by mouth daily.         Signed: Peggyann Shoals, MD 10/27/2014, 8:52 AM

## 2014-10-27 NOTE — Progress Notes (Signed)
Subjective: Patient reports doing well  Objective: Vital signs in last 24 hours: Temp:  [97.6 F (36.4 C)-98.9 F (37.2 C)] 98 F (36.7 C) (01/27 0657) Pulse Rate:  [54-78] 56 (01/27 0657) Resp:  [16] 16 (01/27 0657) BP: (96-143)/(54-74) 123/54 mmHg (01/27 0657) SpO2:  [90 %-100 %] 97 % (01/27 0657)  Intake/Output from previous day: 01/26 0701 - 01/27 0700 In: 900 [I.V.:900] Out: -  Intake/Output this shift:    Physical Exam: Benign exam  Lab Results: No results for input(s): WBC, HGB, HCT, PLT in the last 72 hours. BMET No results for input(s): NA, K, CL, CO2, GLUCOSE, BUN, CREATININE, CALCIUM in the last 72 hours.  Studies/Results: Ct Head Wo Contrast  10/27/2014   CLINICAL DATA:  Follow up subarachnoid hemorrhage. Status post slip and fall.  EXAM: CT HEAD WITHOUT CONTRAST  TECHNIQUE: Contiguous axial images were obtained from the base of the skull through the vertex without intravenous contrast.  COMPARISON:  CT of the head October 26, 2014  FINDINGS: Trace residual subarachnoid hemorrhage seen in LEFT frontal parietal sulci on today's examination. No intraparenchymal hemorrhage, mass effect, midline shift or acute large vascular territory infarcts. Ventricles and sulci are overall normal for patient's age. Mild white matter changes can be seen with chronic small vessel ischemic disease. Remote bilateral basal ganglia lacunar infarcts.  Basal cisterns are patent. Moderate calcific atherosclerosis of the carotid siphons. Ocular globes and orbital contents are unremarkable. No paranasal sinus air-fluid levels. Mastoid air cells are well aerated. Moderate temporomandibular osteoarthrosis. No skull fracture.  IMPRESSION: Trace residual subarachnoid hemorrhage, no hydrocephalus.  Involutional changes. Mild white matter changes can be seen with chronic small vessel ischemic disease.   Electronically Signed   By: Elon Alas   On: 10/27/2014 03:47    Assessment/Plan: Head CT  improved.  D/C home.  F/U with me prn.    LOS: 1 day    Peggyann Shoals, MD 10/27/2014, 8:51 AM

## 2014-10-27 NOTE — Progress Notes (Signed)
Dc hom,e. tranferred to lobby per wc in no acute distress. Dc to care of nephew at entrance.

## 2014-10-27 NOTE — Progress Notes (Signed)
RETRO UR COMPLETED

## 2014-11-08 ENCOUNTER — Ambulatory Visit (INDEPENDENT_AMBULATORY_CARE_PROVIDER_SITE_OTHER): Payer: Medicare HMO | Admitting: Internal Medicine

## 2014-11-08 ENCOUNTER — Encounter: Payer: Self-pay | Admitting: Internal Medicine

## 2014-11-08 VITALS — BP 118/60 | HR 56 | Temp 97.9°F | Ht 72.75 in | Wt 165.8 lb

## 2014-11-08 DIAGNOSIS — D696 Thrombocytopenia, unspecified: Secondary | ICD-10-CM

## 2014-11-08 DIAGNOSIS — R2681 Unsteadiness on feet: Secondary | ICD-10-CM

## 2014-11-08 DIAGNOSIS — E7800 Pure hypercholesterolemia, unspecified: Secondary | ICD-10-CM

## 2014-11-08 DIAGNOSIS — I10 Essential (primary) hypertension: Secondary | ICD-10-CM

## 2014-11-08 DIAGNOSIS — C61 Malignant neoplasm of prostate: Secondary | ICD-10-CM

## 2014-11-08 DIAGNOSIS — I609 Nontraumatic subarachnoid hemorrhage, unspecified: Secondary | ICD-10-CM

## 2014-11-08 NOTE — Progress Notes (Signed)
Patient ID: Carlos Hayden., male   DOB: 06-02-1926, 81 y.o.   MRN: [id number]   Subjective:    Patient ID: Carlos Hayden., male    DOB: 29-May-1926, 81 y.o.   MRN: [id number]  HPI  Patient here for a scheduled follow up.  Has a history of hypertension, hypercholesterolemia and prostate cancer.  Recent evaluated after a fall.  Had a documented subarachnoid hematoma.  Was initially evaluated at [facility].  Transferred to [facility].   Dr Vertell Limber evaluated.  10/27/14 CT - trace residual SAH.  Released to f/u as needed.  He denies headache, dizziness.  States he feels good.  The fall occurred secondary to him slipping on ice.  No other injuries.  Tries to stay active.  No cardiac symptoms with increased activity or exertion.    Past Medical History  Diagnosis Date  . Cancer 11/28/10    Prostate  . Hypertension     Current Outpatient Prescriptions on File Prior to Visit  Medication Sig Dispense Refill  . lisinopril (PRINIVIL,ZESTRIL) 2.5 MG tablet TAKE ONE (1) TABLET EACH DAY 90 tablet 2  . Multiple Vitamin (MULTIVITAMIN) tablet Take 1 tablet by mouth daily.    . psyllium (METAMUCIL) 58.6 % powder Take 1 packet by mouth daily.     No current facility-administered medications on file prior to visit.    Review of Systems  Constitutional: Negative for appetite change, fatigue and unexpected weight change.  HENT: Negative for congestion and sinus pressure.   Respiratory: Negative for cough, chest tightness and shortness of breath.   Cardiovascular: Negative for chest pain, palpitations and leg swelling.  Gastrointestinal: Negative for nausea, vomiting, abdominal pain and diarrhea.  Musculoskeletal: Negative for back pain and joint swelling.  Neurological: Negative for dizziness, light-headedness and headaches.       Feels his gait has improved.  Never went to physical therapy.  Desires not to pursue at this time.         Objective:     Blood pressure recheck:  120/62,  pulse 60  Physical Exam  HENT:  Nose: Nose normal.  Mouth/Throat: Oropharynx is clear and moist.  Neck: Neck supple. No thyromegaly present.  Cardiovascular: Normal rate and regular rhythm.   Pulmonary/Chest: Breath sounds normal. No respiratory distress. He has no wheezes.  Abdominal: Soft. Bowel sounds are normal. There is no tenderness.  Musculoskeletal: He exhibits no edema.  Lymphadenopathy:    He has no cervical adenopathy.    BP 118/60 mmHg  Pulse 56  Temp(Src) 97.9 F (36.6 C) (Oral)  Ht 6' 0.75" (1.848 m)  Wt 165 lb 12 oz (75.184 kg)  BMI 22.02 kg/m2  SpO2 98% Wt Readings from Last 3 Encounters:  11/08/14 165 lb 12 oz (75.184 kg)  07/07/14 169 lb 8 oz (76.885 kg)  04/20/14 169 lb 8 oz (76.885 kg)     Lab Results  Component Value Date   WBC 7.7 09/03/2014   HGB 14.5 09/03/2014   HCT 43.8 09/03/2014   PLT 167 09/20/2014   GLUCOSE 136* 09/03/2014   CHOL 160 09/03/2014   TRIG 93.0 09/03/2014   HDL 30.60* 09/03/2014   LDLCALC 111* 09/03/2014   ALT 13 09/03/2014   AST 19 09/03/2014   NA 141 09/03/2014   K 4.4 09/03/2014   CL 105 09/03/2014   CREATININE 0.9 09/03/2014   BUN 23 09/03/2014   CO2 29 09/03/2014   TSH 3.23 09/03/2014   PSA 5.58 01/21/2006   HGBA1C 5.5  09/20/2014    Ct Head Wo Contrast  10/27/2014   CLINICAL DATA:  Follow up subarachnoid hemorrhage. Status post slip and fall.  EXAM: CT HEAD WITHOUT CONTRAST  TECHNIQUE: Contiguous axial images were obtained from the base of the skull through the vertex without intravenous contrast.  COMPARISON:  CT of the head October 26, 2014  FINDINGS: Trace residual subarachnoid hemorrhage seen in LEFT frontal parietal sulci on today's examination. No intraparenchymal hemorrhage, mass effect, midline shift or acute large vascular territory infarcts. Ventricles and sulci are overall normal for patient's age. Mild white matter changes can be seen with chronic small vessel ischemic disease. Remote bilateral basal  ganglia lacunar infarcts.  Basal cisterns are patent. Moderate calcific atherosclerosis of the carotid siphons. Ocular globes and orbital contents are unremarkable. No paranasal sinus air-fluid levels. Mastoid air cells are well aerated. Moderate temporomandibular osteoarthrosis. No skull fracture.  IMPRESSION: Trace residual subarachnoid hemorrhage, no hydrocephalus.  Involutional changes. Mild white matter changes can be seen with chronic small vessel ischemic disease.   Electronically Signed   By: Elon Alas   On: 10/27/2014 03:47       Assessment & Plan:   Problem List Items Addressed This Visit    Essential hypertension - Primary    Blood pressure doing well.  Same medication regimen.  Follow.  Follow met b.       Relevant Orders   Comprehensive metabolic panel   Hypercholesterolemia    Low cholesterol diet and exercise.  Check FLP with next fasting labs.        Relevant Orders   Lipid panel   Prostate cancer    Followed by Dr Jacqlyn Larsen.  He follows PSAs.  Stressed to him the importance of regular follow up.        Subarachnoid hemorrhage    Recently slipped on ice.  Had [place].  Saw Dr Vertell Limber.  See his note for details.  Doing well.  No headache or dizziness.        Unsteady gait    Improved.  Desires no further intervention.         Other Visit Diagnoses    Thrombocytopenia        Relevant Orders    CBC with Differential/Platelet        Einar Pheasant, MD

## 2014-11-08 NOTE — Progress Notes (Signed)
Pre visit review using our clinic review tool, if applicable. No additional management support is needed unless otherwise documented below in the visit note. 

## 2014-11-09 ENCOUNTER — Encounter: Payer: Self-pay | Admitting: Internal Medicine

## 2014-11-09 NOTE — Assessment & Plan Note (Signed)
Improved.  Desires no further intervention.

## 2014-11-09 NOTE — Assessment & Plan Note (Signed)
Low cholesterol diet and exercise.  Check FLP with next fasting labs.

## 2014-11-09 NOTE — Assessment & Plan Note (Signed)
Recently slipped on ice.  Had [place].  Saw Dr Vertell Limber.  See his note for details.  Doing well.  No headache or dizziness.

## 2014-11-09 NOTE — Assessment & Plan Note (Signed)
Blood pressure doing well.  Same medication regimen.  Follow.  Follow met b.

## 2014-11-09 NOTE — Assessment & Plan Note (Signed)
Followed by Dr Jacqlyn Larsen.  He follows PSAs.  Stressed to him the importance of regular follow up.

## 2015-06-02 ENCOUNTER — Ambulatory Visit (INDEPENDENT_AMBULATORY_CARE_PROVIDER_SITE_OTHER): Payer: Medicare HMO

## 2015-06-02 DIAGNOSIS — Z23 Encounter for immunization: Secondary | ICD-10-CM

## 2015-08-04 DIAGNOSIS — K529 Noninfective gastroenteritis and colitis, unspecified: Secondary | ICD-10-CM | POA: Diagnosis not present

## 2015-08-04 DIAGNOSIS — C61 Malignant neoplasm of prostate: Secondary | ICD-10-CM | POA: Diagnosis not present

## 2015-08-04 DIAGNOSIS — N320 Bladder-neck obstruction: Secondary | ICD-10-CM | POA: Diagnosis not present

## 2015-08-04 DIAGNOSIS — R339 Retention of urine, unspecified: Secondary | ICD-10-CM | POA: Diagnosis not present

## 2015-08-04 DIAGNOSIS — R011 Cardiac murmur, unspecified: Secondary | ICD-10-CM | POA: Diagnosis not present

## 2015-08-04 DIAGNOSIS — R3914 Feeling of incomplete bladder emptying: Secondary | ICD-10-CM | POA: Diagnosis not present

## 2015-08-09 ENCOUNTER — Telehealth: Payer: Self-pay | Admitting: *Deleted

## 2015-08-09 NOTE — Telephone Encounter (Signed)
Placed in green folder 

## 2015-08-09 NOTE — Telephone Encounter (Signed)
Patient dropped off paperwork from [employer] to view,  patient was concerned that he may need a appt, to be seen. Patient requested a call back. Patient stated that he will pick the forms up once Dr. Nicki Reaper has finish viewing. Forms are in Box

## 2015-08-10 ENCOUNTER — Other Ambulatory Visit: Payer: Self-pay | Admitting: Internal Medicine

## 2015-08-10 NOTE — Telephone Encounter (Signed)
I reviewed Dr Cope's note.  His psa was slightly increased.  He had recommended to follow psa with repeat in 6 months.  Is he having other issues?  If he feels he needs appt, we can schedule.  Just need more information.  His paper placed back in your box.

## 2015-08-11 ENCOUNTER — Encounter: Payer: Self-pay | Admitting: *Deleted

## 2015-08-11 NOTE — Telephone Encounter (Signed)
Unable to reach patient on either phone number. Mailed letter along with the papers that he dropped off for Dr. Nicki Reaper to review.

## 2015-12-01 ENCOUNTER — Encounter: Payer: Medicare HMO | Admitting: Internal Medicine

## 2015-12-01 ENCOUNTER — Encounter: Payer: Self-pay | Admitting: *Deleted

## 2016-02-06 DIAGNOSIS — C61 Malignant neoplasm of prostate: Secondary | ICD-10-CM | POA: Diagnosis not present

## 2016-03-02 ENCOUNTER — Encounter: Payer: Self-pay | Admitting: Internal Medicine

## 2016-03-09 ENCOUNTER — Encounter: Payer: Self-pay | Admitting: Internal Medicine

## 2016-03-26 ENCOUNTER — Emergency Department: Payer: Medicare PPO

## 2016-03-26 ENCOUNTER — Encounter: Payer: Self-pay | Admitting: Emergency Medicine

## 2016-03-26 ENCOUNTER — Encounter: Payer: Self-pay | Admitting: Internal Medicine

## 2016-03-26 ENCOUNTER — Emergency Department
Admission: EM | Admit: 2016-03-26 | Discharge: 2016-03-26 | Disposition: A | Discharge status: Home / Self Care | Payer: Medicare PPO | Attending: Emergency Medicine | Admitting: Emergency Medicine

## 2016-03-26 DIAGNOSIS — R9389 Abnormal findings on diagnostic imaging of other specified body structures: Secondary | ICD-10-CM | POA: Insufficient documentation

## 2016-03-26 DIAGNOSIS — S20211A Contusion of right front wall of thorax, initial encounter: Secondary | ICD-10-CM | POA: Insufficient documentation

## 2016-03-26 DIAGNOSIS — Y939 Activity, unspecified: Secondary | ICD-10-CM | POA: Insufficient documentation

## 2016-03-26 DIAGNOSIS — Z8546 Personal history of malignant neoplasm of prostate: Secondary | ICD-10-CM | POA: Diagnosis not present

## 2016-03-26 DIAGNOSIS — W01190A Fall on same level from slipping, tripping and stumbling with subsequent striking against furniture, initial encounter: Secondary | ICD-10-CM | POA: Insufficient documentation

## 2016-03-26 DIAGNOSIS — I10 Essential (primary) hypertension: Secondary | ICD-10-CM | POA: Diagnosis not present

## 2016-03-26 DIAGNOSIS — Y999 Unspecified external cause status: Secondary | ICD-10-CM | POA: Insufficient documentation

## 2016-03-26 DIAGNOSIS — Y929 Unspecified place or not applicable: Secondary | ICD-10-CM | POA: Insufficient documentation

## 2016-03-26 DIAGNOSIS — Z87891 Personal history of nicotine dependence: Secondary | ICD-10-CM | POA: Insufficient documentation

## 2016-03-26 DIAGNOSIS — Z79899 Other long term (current) drug therapy: Secondary | ICD-10-CM | POA: Diagnosis not present

## 2016-03-26 DIAGNOSIS — S299XXA Unspecified injury of thorax, initial encounter: Secondary | ICD-10-CM | POA: Diagnosis not present

## 2016-03-26 MED ORDER — NAPROXEN 375 MG PO TABS: 375.0000 mg | ORAL_TABLET | Freq: Two times a day (BID) | ORAL | Status: AC

## 2016-03-26 MED ORDER — TRAMADOL HCL 50 MG PO TABS
50.0000 mg | ORAL_TABLET | Freq: Once | ORAL | Status: AC
  Administered 2016-03-26: 50 mg via ORAL
  Filled 2016-03-26: qty 1

## 2016-03-26 NOTE — ED Notes (Signed)
Pt reports experiencing a fall during the night last pm and he has been hurting in his right rib cage area ever since - pt expresses increased pain with cough and deep breaths

## 2016-03-26 NOTE — ED Notes (Signed)
States he got up during the night tripped  Hit right rib area

## 2016-03-26 NOTE — ED Provider Notes (Signed)
[facility] Emergency Department Provider Note   ____________________________________________  Time seen: Approximately 2:01 PM  I have reviewed the triage vital signs and the nursing notes.   HISTORY  Chief Complaint Fall    HPI Carlos Hayden. is a 82 y.o. male patient complain right and posterior rib pain secondary to a fall last night. Patient state he fell his his right ribs against a chair. Patient state increased pain with cough and deep breathing. Patient states the fall did not result for loss of consciousness. Patient denies any head injury with this fall. Patient has taken over-the-counter anti-inflammatory medications none notes were released. Patient rating the pain as a 6/10. Patient described a pain as "achy". [place] measures for this complaint.   Past Medical History  Diagnosis Date  . Cancer ([place]) 11/28/10    Prostate  . Hypertension     Patient Active Problem List   Diagnosis Date Noted  . Subarachnoid hemorrhage ([place]) 10/26/2014  . Subarachnoid hematoma ([place]) 10/26/2014  . Unsteady gait 07/11/2014  . Essential hypertension 05/25/2008  . Hypercholesterolemia 04/14/2008  . Prostate cancer ([place]) 04/14/2008  . VASOVAGAL SYNCOPE 08/05/2007    Past Surgical History  Procedure Laterality Date  . Tonsillectomy  1930's  . Total knee arthroplasty      right    Current Outpatient Rx  Name  Route  Sig  Dispense  Refill  . lisinopril (PRINIVIL,ZESTRIL) 2.5 MG tablet      TAKE ONE (1) TABLET EACH DAY   90 tablet   1   . Multiple Vitamin (MULTIVITAMIN) tablet   Oral   Take 1 tablet by mouth daily.         . naproxen (NAPROSYN) 375 MG tablet   Oral   Take 1 tablet (375 mg total) by mouth 2 (two) times daily with a meal.   20 tablet   0   . psyllium (METAMUCIL) 58.6 % powder   Oral   Take 1 packet by mouth daily.           Allergies Review of patient's allergies indicates no known  allergies.  Family History  Problem Relation Age of Onset  . Prostate cancer Father   . Lung cancer Mother     Social History Social History  Substance Use Topics  . Smoking status: Former Research scientist (life sciences)  . Smokeless tobacco: Never Used     Comment: QUIT SMOKING IN THE EARLY 50'S  . Alcohol Use: No    Review of Systems Constitutional: No fever/chills Eyes: No visual changes. ENT: No sore throat. Cardiovascular: Denies chest pain. Respiratory: Denies shortness of breath. Gastrointestinal: No abdominal pain.  No nausea, no vomiting.  No diarrhea.  No constipation. Genitourinary: Negative for dysuria. Musculoskeletal: Right lateral posterior chest wall pain.  Skin: Negative for rash. Chest wall abrasion Neurological: Negative for headaches, focal weakness or numbness. Endocrine:Hypertension ______________   PHYSICAL EXAM:  VITAL SIGNS: ED Triage Vitals  Enc Vitals Group     BP --      Pulse --      Resp --      Temp --      Temp src --      SpO2 --      Weight --      Height --      Head Cir --      Peak Flow --      Pain Score 03/26/16 1256 6     Pain Loc --  Pain Edu? --      Excl. in [place]? --     Constitutional: Alert and oriented. Well appearing and in no acute distress. Eyes: Conjunctivae are normal. PERRL. EOMI. Head: Atraumatic. Nose: No congestion/rhinnorhea. Mouth/Throat: Mucous membranes are moist.  Oropharynx non-erythematous. Neck: No stridor.  No cervical spine tenderness to palpation. Hematological/Lymphatic/Immunilogical: No cervical lymphadenopathy. Cardiovascular: Normal rate, regular rhythm. Grossly normal heart sounds.  Good peripheral circulation. Respiratory: Normal respiratory effort.  No retractions. Lungs CTAB. Gastrointestinal: Soft and nontender. No distention. No abdominal bruits. No CVA tenderness. Musculoskeletal: No lower extremity tenderness nor edema.  No joint effusions. Neurologic:  Normal speech and language. No gross focal  neurologic deficits are appreciated. No gait instability. Skin:  Skin is warm, dry and intact. No rash noted. Psychiatric: Mood and affect are normal. Speech and behavior are normal.  ____________________________________________   LABS (all labs ordered are listed, but only abnormal results are displayed)  Labs Reviewed - No data to display ____________________________________________  EKG   ____________________________________________  RADIOLOGY  X-ray was negative for rib fractures. There is a small nodular density versus a vascular structure in the left suprahilar area. ____________________________________________   PROCEDURES  Procedure(s) performed: None  Critical Care performed: No  ____________________________________________   INITIAL IMPRESSION / ASSESSMENT AND PLAN / ED COURSE  Pertinent labs & imaging results that were available during my care of the patient were reviewed by me and considered in my medical decision making (see chart for details).  Right rib contusion. Small nodular density lesion in the left suprahilar area. This x-ray finding with patient. Advised to follow-up family doctor for repeat x-ray in 3 months. ____________________________________________   FINAL CLINICAL IMPRESSION(S) / ED DIAGNOSES  Final diagnoses:  Rib contusion, right, initial encounter      NEW MEDICATIONS STARTED DURING THIS VISIT:  New Prescriptions   NAPROXEN (NAPROSYN) 375 MG TABLET    Take 1 tablet (375 mg total) by mouth 2 (two) times daily with a meal.     Note:  This document was prepared using Dragon voice recognition software and may include unintentional dictation errors.    Sable Feil, PA-C 03/26/16 [place], PA-C 03/26/16 1411  Daymon Larsen, MD 03/26/16 [phone]

## 2016-04-01 ENCOUNTER — Telehealth: Payer: Self-pay | Admitting: Internal Medicine

## 2016-04-01 NOTE — Telephone Encounter (Signed)
Received notification that pt was seen in ER for fall.  Please call and see if pt doing ok and confirm if he needs f/u appt.  Thanks.

## 2016-04-02 NOTE — Telephone Encounter (Signed)
Pt states that he is doing well & he does not need an appt at this time.

## 2016-07-18 DIAGNOSIS — D3132 Benign neoplasm of left choroid: Secondary | ICD-10-CM | POA: Diagnosis not present

## 2016-07-23 ENCOUNTER — Ambulatory Visit (INDEPENDENT_AMBULATORY_CARE_PROVIDER_SITE_OTHER): Payer: Medicare PPO

## 2016-07-23 DIAGNOSIS — Z23 Encounter for immunization: Secondary | ICD-10-CM

## 2016-08-30 ENCOUNTER — Telehealth: Payer: Self-pay | Admitting: *Deleted

## 2016-08-30 NOTE — Telephone Encounter (Signed)
Scheduled for Monday at 930, advised if pan gets worse before then can go to walk in or call back

## 2016-08-30 NOTE — Telephone Encounter (Signed)
I am ok with this appt if he is ok with waiting until Monday.  If acute symptoms - may need to be seen earlier.

## 2016-08-30 NOTE — Telephone Encounter (Signed)
We have a 930 monday?

## 2016-08-30 NOTE — Telephone Encounter (Addendum)
Pt requested to see Dr. Nicki Reaper only, he occurred pain on the right shoulder and neck from pushing his lawn mower and fall  3 days ago.  Pt did not seek emergency treatment,daughter tried to get him to go, pt refused.Please give a time and date to place pt. He requested to be called this later afternoon, he has a errand to run. Pt contact [phone] Or Daughter Charleston Ropes [phone] Update Pt can be reached on his daughter phone listed above

## 2016-09-03 ENCOUNTER — Ambulatory Visit: Payer: Self-pay | Admitting: Internal Medicine

## 2016-09-03 DIAGNOSIS — Z0289 Encounter for other administrative examinations: Secondary | ICD-10-CM

## 2016-10-31 ENCOUNTER — Telehealth: Payer: Self-pay

## 2016-10-31 DIAGNOSIS — E538 Deficiency of other specified B group vitamins: Secondary | ICD-10-CM

## 2016-10-31 DIAGNOSIS — R739 Hyperglycemia, unspecified: Secondary | ICD-10-CM

## 2016-10-31 DIAGNOSIS — I10 Essential (primary) hypertension: Secondary | ICD-10-CM

## 2016-10-31 DIAGNOSIS — E7800 Pure hypercholesterolemia, unspecified: Secondary | ICD-10-CM

## 2016-10-31 DIAGNOSIS — C61 Malignant neoplasm of prostate: Secondary | ICD-10-CM

## 2016-10-31 NOTE — Telephone Encounter (Signed)
Schedule fasting lab appt (this week) and schedule a f/u appt with me.  Ok to refill until appt.

## 2016-10-31 NOTE — Telephone Encounter (Signed)
Patient in with his wife today for med check. He needs refill on Lisinopril. He admits he is not taking regularly. He did not show for his last app with you.   Last o/v 11/08/14  He does not have follow up at this time.

## 2016-10-31 NOTE — Telephone Encounter (Signed)
Can you call patient and make app. Let me know when it is and I send in refill.

## 2016-10-31 NOTE — Telephone Encounter (Signed)
Pt is scheduled for 2/26 @ 11am.. Pt wanted to know if he would have labs done.. Please advise

## 2016-11-01 MED ORDER — LISINOPRIL 2.5 MG PO TABS
ORAL_TABLET | ORAL | 0 refills | Status: DC
Start: 2016-11-01 — End: 2017-04-22

## 2016-11-01 NOTE — Telephone Encounter (Signed)
Script faxed he will need app for labs  L/m to call office. To make app.

## 2016-11-02 NOTE — Telephone Encounter (Signed)
App made for labs on 2/22 and o/v on 2/26. I do not see any order for labs in yet.   I have sent patient letter with all app dates and times. The script has been sent to pharmacy but I do not think he has picked up yet.

## 2016-11-03 NOTE — Telephone Encounter (Signed)
Order placed for f/u labs.  

## 2016-11-22 ENCOUNTER — Other Ambulatory Visit: Payer: Self-pay

## 2016-11-26 ENCOUNTER — Ambulatory Visit (INDEPENDENT_AMBULATORY_CARE_PROVIDER_SITE_OTHER): Payer: Medicare PPO

## 2016-11-26 ENCOUNTER — Ambulatory Visit (INDEPENDENT_AMBULATORY_CARE_PROVIDER_SITE_OTHER): Payer: Medicare PPO | Admitting: Internal Medicine

## 2016-11-26 ENCOUNTER — Encounter: Payer: Self-pay | Admitting: Internal Medicine

## 2016-11-26 VITALS — BP 140/62 | HR 50 | Temp 98.2°F | Resp 16 | Ht 73.0 in | Wt 161.8 lb

## 2016-11-26 DIAGNOSIS — E538 Deficiency of other specified B group vitamins: Secondary | ICD-10-CM

## 2016-11-26 DIAGNOSIS — R9389 Abnormal findings on diagnostic imaging of other specified body structures: Secondary | ICD-10-CM

## 2016-11-26 DIAGNOSIS — R739 Hyperglycemia, unspecified: Secondary | ICD-10-CM | POA: Diagnosis not present

## 2016-11-26 DIAGNOSIS — E7800 Pure hypercholesterolemia, unspecified: Secondary | ICD-10-CM

## 2016-11-26 DIAGNOSIS — Z8546 Personal history of malignant neoplasm of prostate: Secondary | ICD-10-CM | POA: Diagnosis not present

## 2016-11-26 DIAGNOSIS — J449 Chronic obstructive pulmonary disease, unspecified: Secondary | ICD-10-CM | POA: Diagnosis not present

## 2016-11-26 DIAGNOSIS — I10 Essential (primary) hypertension: Secondary | ICD-10-CM

## 2016-11-26 DIAGNOSIS — R938 Abnormal findings on diagnostic imaging of other specified body structures: Secondary | ICD-10-CM

## 2016-11-26 LAB — BASIC METABOLIC PANEL
BUN: 20 mg/dL (ref 6–23)
CHLORIDE: 103 meq/L (ref 96–112)
CO2: 31 mEq/L (ref 19–32)
CREATININE: 0.75 mg/dL (ref 0.40–1.50)
Calcium: 8.9 mg/dL (ref 8.4–10.5)
GFR: 104.32 mL/min (ref >60.00)
Glucose, Bld: 87 mg/dL (ref 70–99)
POTASSIUM: 4.4 meq/L (ref 3.5–5.1)
SODIUM: 141 meq/L (ref 135–145)

## 2016-11-26 LAB — CBC WITH DIFFERENTIAL/PLATELET
BASOS ABS: 0.1 10*3/uL (ref 0.0–0.1)
BASOS PCT: 1.1 % (ref 0.0–3.0)
EOS ABS: 0.1 10*3/uL (ref 0.0–0.7)
Eosinophils Relative: 1.9 % (ref 0.0–5.0)
HEMATOCRIT: 41.9 % (ref 39.0–52.0)
HEMOGLOBIN: 14.4 g/dL (ref 13.0–17.0)
LYMPHS PCT: 21 % (ref 12.0–46.0)
Lymphs Abs: 1.1 10*3/uL (ref 0.7–4.0)
MCHC: 34.3 g/dL (ref 30.0–36.0)
MCV: 95.5 fl (ref 78.0–100.0)
MONOS PCT: 7.3 % (ref 3.0–12.0)
Monocytes Absolute: 0.4 10*3/uL (ref 0.1–1.0)
Neutro Abs: 3.5 10*3/uL (ref 1.4–7.7)
Neutrophils Relative %: 68.7 % (ref 43.0–77.0)
Platelets: 169 10*3/uL (ref 150.0–400.0)
RBC: 4.39 Mil/uL (ref 4.22–5.81)
RDW: 13.2 % (ref 11.5–15.5)
WBC: 5.1 10*3/uL (ref 4.0–10.5)

## 2016-11-26 LAB — HEPATIC FUNCTION PANEL
ALBUMIN: 4 g/dL (ref 3.5–5.2)
ALK PHOS: 69 U/L (ref 39–117)
ALT: 6 U/L (ref 0–53)
AST: 11 U/L (ref 0–37)
BILIRUBIN DIRECT: 0.1 mg/dL (ref 0.0–0.3)
TOTAL PROTEIN: 6.4 g/dL (ref 6.0–8.3)
Total Bilirubin: 0.8 mg/dL (ref 0.2–1.2)

## 2016-11-26 LAB — LIPID PANEL
Cholesterol: 182 mg/dL (ref 0–200)
HDL: 34.6 mg/dL — ABNORMAL LOW (ref >39.00)
LDL CALC: 126 mg/dL — AB (ref 0–99)
NonHDL: 147.22
Total CHOL/HDL Ratio: 5
Triglycerides: 108 mg/dL (ref 0.0–149.0)
VLDL: 21.6 mg/dL (ref 0.0–40.0)

## 2016-11-26 LAB — VITAMIN B12: VITAMIN B 12: 162 pg/mL — AB (ref 211–911)

## 2016-11-26 LAB — TSH: TSH: 3.1 u[IU]/mL (ref 0.35–4.50)

## 2016-11-26 LAB — HEMOGLOBIN A1C: Hgb A1c MFr Bld: 5.5 % (ref 4.6–6.5)

## 2016-11-26 NOTE — Progress Notes (Signed)
Pre-visit discussion using our clinic review tool. No additional management support is needed unless otherwise documented below in the visit note.  

## 2016-11-26 NOTE — Progress Notes (Signed)
Patient ID: Carlos Clark., male   DOB: Aug 30, 1926, 83 y.o.   MRN: [id number]   Subjective:    Patient ID: Carlos Clark., male    DOB: Mar 21, 1926, 83 y.o.   MRN: [id number]  HPI  Patient here for a scheduled follow up.  He was asked to schedule an appt because I had not seen him since 11/2014.  He was seen in the ER 03/26/16 after a fall.  Note reviewed.  He is doing better from the fall.  There was noted to be a small nodular density versus a vascular structure in the left suprahilar area.  Discussed with him today the need for f/u.  He reports he is doing relatively well.  Eating.  No nausea or vomiting.  Bowels moving.  No chest pain.  Breathing stable.     Past Medical History:  Diagnosis Date  . Cancer ([place]) 11/28/10   Prostate  . Hypertension    Past Surgical History:  Procedure Laterality Date  . TONSILLECTOMY  1930's  . TOTAL KNEE ARTHROPLASTY     right   Family History  Problem Relation Age of Onset  . Prostate cancer Father   . Lung cancer Mother   . Cancer Daughter   . Stroke Daughter   . Heart attack Daughter    Social History   Social History  . Marital status: Married    Spouse name: N/A  . Number of children: N/A  . Years of education: N/A   Social History Main Topics  . Smoking status: Former Research scientist (life sciences)  . Smokeless tobacco: Never Used     Comment: QUIT SMOKING IN THE EARLY 50'S  . Alcohol use No  . Drug use: No  . Sexual activity: Not Asked   Other Topics Concern  . None   Social History Narrative  . None    Outpatient Encounter Prescriptions as of 11/26/2016  Medication Sig  . lisinopril (PRINIVIL,ZESTRIL) 2.5 MG tablet TAKE ONE (1) TABLET EACH DAY  . Multiple Vitamin (MULTIVITAMIN) tablet Take 1 tablet by mouth daily.  . naproxen (NAPROSYN) 375 MG tablet Take 1 tablet (375 mg total) by mouth 2 (two) times daily with a meal.  . psyllium (METAMUCIL) 58.6 % powder Take 1 packet by mouth daily.   No facility-administered  encounter medications on file as of 11/26/2016.     Review of Systems  Constitutional: Negative for appetite change.       Weight has decreased some when compared to last check.   HENT: Negative for congestion and sinus pressure.   Respiratory: Negative for cough, chest tightness and shortness of breath.   Cardiovascular: Negative for chest pain, palpitations and leg swelling.  Gastrointestinal: Negative for abdominal pain, diarrhea, nausea and vomiting.  Genitourinary: Negative for difficulty urinating and dysuria.  Musculoskeletal: Negative for back pain and joint swelling.  Skin: Negative for color change and rash.  Neurological: Negative for dizziness, light-headedness and headaches.  Psychiatric/Behavioral: Negative for agitation and dysphoric mood.       Objective:    Physical Exam  Constitutional: He appears well-developed and well-nourished. No distress.  HENT:  Nose: Nose normal.  Mouth/Throat: Oropharynx is clear and moist.  Neck: Neck supple. No thyromegaly present.  Cardiovascular: Normal rate and regular rhythm.   Pulmonary/Chest: Effort normal and breath sounds normal. No respiratory distress.  Abdominal: Soft. Bowel sounds are normal. There is no tenderness.  Musculoskeletal: He exhibits no edema or tenderness.  Lymphadenopathy:    He has no  cervical adenopathy.  Skin: No rash noted. No erythema.  Psychiatric: He has a normal mood and affect. His behavior is normal.    BP 140/62 (BP Location: Left Arm, Patient Position: Sitting, Cuff Size: Large)   Pulse (!) 50   Temp 98.2 F (36.8 C) (Oral)   Resp 16   Ht 6\' 1"  (1.854 m)   Wt 161 lb 12.8 oz (73.4 kg)   SpO2 94%   BMI 21.35 kg/m  Wt Readings from Last 3 Encounters:  11/30/16 162 lb 12.8 oz (73.8 kg)  11/26/16 161 lb 12.8 oz (73.4 kg)  11/08/14 165 lb 12 oz (75.2 kg)     Lab Results  Component Value Date   WBC 5.1 11/26/2016   HGB 14.4 11/26/2016   HCT 41.9 11/26/2016   PLT 169.0 11/26/2016    GLUCOSE 87 11/26/2016   CHOL 182 11/26/2016   TRIG 108.0 11/26/2016   HDL 34.60 (L) 11/26/2016   LDLCALC 126 (H) 11/26/2016   ALT 6 11/26/2016   AST 11 11/26/2016   NA 141 11/26/2016   K 4.4 11/26/2016   CL 103 11/26/2016   CREATININE 0.75 11/26/2016   BUN 20 11/26/2016   CO2 31 11/26/2016   TSH 3.10 11/26/2016   PSA 5.58 01/21/2006   INR 1.1 10/26/2014   HGBA1C 5.5 11/26/2016    Dg Ribs Unilateral W/chest Right  Result Date: 03/26/2016 CLINICAL DATA:  [place] backwards getting out of bed this morning with injury to right lower posterior ribs. EXAM: RIGHT RIBS AND CHEST - 3+ VIEW COMPARISON:  None. FINDINGS: The lungs are adequately inflated without focal consolidation or effusion. Small nodular density versus vascular structure in the left suprahilar region. Cardiomediastinal silhouette is within normal. There is mild calcified plaque over the aortic arch. No evidence of right rib fracture. Mild degenerate change of the spine and right shoulder. IMPRESSION: No acute cardiopulmonary disease. No acute rib fracture. Small nodular density in the left suprahilar region versus prominent vessel. Recommend follow-up chest radiograph 3 months. Electronically Signed   By: Marin Olp M.D.   On: 03/26/2016 13:30       Assessment & Plan:   Problem List Items Addressed This Visit    Abnormal x-ray    Xray as outlined.  Check f/u cxr today.        Essential hypertension    Blood pressure under reasonable control.  Continue same medication regimen.  Follow pressures.  Follow metabolic panel.       History of prostate cancer    Has been followed by Dr Jacqlyn Larsen.        Hypercholesterolemia    Other Visit Diagnoses    Abnormal CXR    -  Primary   Relevant Orders   DG Chest 2 View (Completed)   Hyperglycemia       B12 deficiency           Einar Pheasant, MD

## 2016-11-30 ENCOUNTER — Ambulatory Visit (INDEPENDENT_AMBULATORY_CARE_PROVIDER_SITE_OTHER): Payer: Medicare PPO

## 2016-11-30 VITALS — BP 130/62 | HR 69 | Temp 98.6°F | Resp 14 | Ht 72.0 in | Wt 162.8 lb

## 2016-11-30 DIAGNOSIS — Z0000 Encounter for general adult medical examination without abnormal findings: Secondary | ICD-10-CM | POA: Diagnosis not present

## 2016-11-30 NOTE — Progress Notes (Signed)
Subjective:   Carlos Hayden. is a 83 y.o. male who presents for an Initial Medicare Annual Wellness Visit.  Review of Systems  No ROS.  Medicare Wellness Visit.  Cardiac Risk Factors include: male gender;hypertension;advanced age (>47men, >19 women)    Objective:    Today's Vitals   11/30/16 1604  BP: 130/62  Pulse: 69  Resp: 14  Temp: 98.6 F (37 C)  TempSrc: Oral  SpO2: 98%  Weight: 162 lb 12.8 oz (73.8 kg)  Height: 6' (1.829 m)   Body mass index is 22.08 kg/m.  Current Medications (verified) Outpatient Encounter Prescriptions as of 11/30/2016  Medication Sig  . lisinopril (PRINIVIL,ZESTRIL) 2.5 MG tablet TAKE ONE (1) TABLET EACH DAY  . Multiple Vitamin (MULTIVITAMIN) tablet Take 1 tablet by mouth daily.  . naproxen (NAPROSYN) 375 MG tablet Take 1 tablet (375 mg total) by mouth 2 (two) times daily with a meal.  . psyllium (METAMUCIL) 58.6 % powder Take 1 packet by mouth daily.   No facility-administered encounter medications on file as of 11/30/2016.     Allergies (verified) Patient has no known allergies.   History: Past Medical History:  Diagnosis Date  . Cancer ([place]) 11/28/10   Prostate  . Hypertension    Past Surgical History:  Procedure Laterality Date  . TONSILLECTOMY  1930's  . TOTAL KNEE ARTHROPLASTY     right   Family History  Problem Relation Age of Onset  . Prostate cancer Father   . Lung cancer Mother   . Cancer Daughter   . Stroke Daughter   . Heart attack Daughter    Social History   Occupational History  . Not on file.   Social History Main Topics  . Smoking status: Former Research scientist (life sciences)  . Smokeless tobacco: Never Used     Comment: QUIT SMOKING IN THE EARLY 50'S  . Alcohol use No  . Drug use: No  . Sexual activity: Not on file   Tobacco Counseling Counseling given: Not Answered   Activities of Daily Living In your present state of health, do you have any difficulty performing the following activities: 11/30/2016    Hearing? N  Vision? N  Difficulty concentrating or making decisions? N  Walking or climbing stairs? N  Dressing or bathing? N  Doing errands, shopping? N  Preparing Food and eating ? N  Using the Toilet? N  In the past six months, have you accidently leaked urine? N  Do you have problems with loss of bowel control? N  Managing your Medications? N  Managing your Finances? N  Housekeeping or managing your Housekeeping? N  Some recent data might be hidden    Immunizations and Health Maintenance Immunization History  Administered Date(s) Administered  . Influenza Whole 08/01/2005  . Influenza, High Dose Seasonal PF 07/23/2016  . Influenza,inj,Quad PF,36+ Mos 07/20/2013, 06/10/2014, 06/02/2015  . Pneumococcal Conjugate-13 12/03/2013  . Pneumococcal Polysaccharide-23 07/24/1999  . Td 05/01/1998, 08/23/2008   There are no preventive care reminders to display for this patient.  Patient Care Team: Einar Pheasant, MD as PCP - General (Internal Medicine)  Indicate any recent Medical Services you may have received from other than [facility] providers in the past year (date may be approximate).    Assessment:   This is a routine wellness examination for Carlos Hayden. The goal of the wellness visit is to assist the patient how to close the gaps in care and create a preventative care plan for the patient.   Osteoporosis risk reviewed.  Medications reviewed; taking without issues or barriers.  Safety issues reviewed; smoke detectors in the home. Firearms locked up in the home. Wears seatbelts when driving or riding with others. Patient does wear sunscreen or protective clothing when in direct sunlight. No violence in the home.  Patient is alert, normal appearance, oriented to person/place/and time. Correctly identified the president of the [place], recall of 1/3 objects, and performing simple calculations.  Patient displays appropriate judgement and can read correct time from watch face.  No  new identified risk were noted.  No failures at ADL's or IADL's.   BMI- discussed the importance of a healthy diet, water intake and exercise. Educational material provided.   Diet: Breakfast: Boiled egg Lunch: Beans, rice Dinner: Chicken tenders, green vegetable Daily fluid intake: 1-2 cups of juice, 3 cups of water, 0 cups of caffeine  HTN- followed by PCP.  Eye- Visual acuity not assessed per patient preference since they have regular follow up with the ophthalmologist.  Wears corrective lenses.  Sleep patterns- Sleeps 7-8 hours at night.  Wakes feeling rested.  Health maintenance gaps- closed.  Patient Concerns: None at this time. Follow up with PCP as needed.  Hearing/Vision screen Hearing Screening Comments: Patient is able to hear conversational tones without difficulty.  No issues reported.  Vision Screening Comments: Wears corrective lenses Last OV 2017 Visual acuity not assessed per patient preference since they have regular follow up with the ophthalmologist  Dietary issues and exercise activities discussed: Current Exercise Habits: The patient does not participate in regular exercise at present  Goals    . Increase physical activity          Stay active, walk for exercise    . Increase water intake          Stay hydrated, drink plenty of water      Depression Screen PHQ 2/9 Scores 11/30/2016 11/26/2016 04/20/2014 07/20/2013  PHQ - 2 Score 0 0 0 0    Fall Risk Fall Risk  11/30/2016 11/26/2016 11/08/2014 04/20/2014 07/20/2013  Falls in the past year? Yes Yes Yes No No  Number falls in past yr: 2 or more 2 or more 1 - -  Injury with Fall? No No No - -  Risk Factor Category  High Fall Risk High Fall Risk - - -  Risk for fall due to : - Impaired balance/gait - - -  Follow up Education provided;Falls prevention discussed Falls prevention discussed - - -    Cognitive Function:     6CIT Screen 11/30/2016  What Year? 0 points  What month? 0 points  What time? 0  points  Count back from 20 0 points  Months in reverse 0 points    Screening Tests Health Maintenance  Topic Date Due  . TETANUS/TDAP  08/23/2018  . INFLUENZA VACCINE  Completed  . PNA vac Low Risk Adult  Completed        Plan:    End of life planning; Advance aging; Advanced directives discussed. Copy of current HCPOA/Living Will requested.    Medicare Attestation I have personally reviewed: The patient's medical and social history Their use of alcohol, tobacco or illicit drugs Their current medications and supplements The patient's functional ability including ADLs,fall risks, home safety risks, cognitive, and hearing and visual impairment Diet and physical activities Evidence for depression   The patient's weight, height, BMI, and visual acuity have been recorded in the chart.  I have made referrals and provided education to the patient based  on review of the above and I have provided the patient with a written personalized care plan for preventive services.    During the course of the visit Ramel was educated and counseled about the following appropriate screening and preventive services:   Vaccines to include Pneumoccal, Influenza, Hepatitis B, Td, Zostavax, HCV  Glaucoma screening  Nutrition counseling   Patient Instructions (the written plan) were given to the patient.   Varney Biles, LPN   [id number]    Reviewed above information.  Agree with plan.  Dr Nicki Reaper

## 2016-11-30 NOTE — Patient Instructions (Addendum)
  Carlos Hayden , Thank you for taking time to come for your Medicare Wellness Visit. I appreciate your ongoing commitment to your health goals. Please review the following plan we discussed and let me know if I can assist you in the future.   Follow up with Dr. Nicki Reaper as needed.    Bring a copy of your [facility] and/or Living Will to be scanned into chart.  Have a great day!  These are the goals we discussed: Goals    . Increase physical activity          Stay active, walk for exercise    . Increase water intake          Stay hydrated, drink plenty of water       This is a list of the screening recommended for you and due dates:  Health Maintenance  Topic Date Due  . Tetanus Vaccine  08/23/2018  . Flu Shot  Completed  . Pneumonia vaccines  Completed

## 2016-12-04 ENCOUNTER — Ambulatory Visit: Payer: Self-pay

## 2016-12-08 ENCOUNTER — Encounter: Payer: Self-pay | Admitting: Internal Medicine

## 2016-12-08 NOTE — Assessment & Plan Note (Signed)
Blood pressure under reasonable control.  Continue same medication regimen.  Follow pressures.  Follow metabolic panel.   

## 2016-12-08 NOTE — Assessment & Plan Note (Signed)
Xray as outlined.  Check f/u cxr today.

## 2016-12-08 NOTE — Assessment & Plan Note (Signed)
Has been followed by Dr Cope.   

## 2017-03-15 ENCOUNTER — Encounter: Payer: Self-pay | Admitting: Internal Medicine

## 2017-03-15 ENCOUNTER — Ambulatory Visit (INDEPENDENT_AMBULATORY_CARE_PROVIDER_SITE_OTHER): Payer: Medicare PPO | Admitting: Internal Medicine

## 2017-03-15 VITALS — BP 144/60 | HR 56 | Temp 97.8°F | Ht 72.0 in | Wt 163.4 lb

## 2017-03-15 DIAGNOSIS — E7800 Pure hypercholesterolemia, unspecified: Secondary | ICD-10-CM

## 2017-03-15 DIAGNOSIS — E538 Deficiency of other specified B group vitamins: Secondary | ICD-10-CM

## 2017-03-15 DIAGNOSIS — I10 Essential (primary) hypertension: Secondary | ICD-10-CM

## 2017-03-15 DIAGNOSIS — Z8546 Personal history of malignant neoplasm of prostate: Secondary | ICD-10-CM | POA: Diagnosis not present

## 2017-03-15 DIAGNOSIS — R413 Other amnesia: Secondary | ICD-10-CM

## 2017-03-15 LAB — POCT GLUCOSE (DEVICE FOR HOME USE): POC GLUCOSE: 118 mg/dL — AB (ref 70–99)

## 2017-03-15 LAB — LIPID PANEL
CHOL/HDL RATIO: 5
Cholesterol: 155 mg/dL (ref 0–200)
HDL: 34 mg/dL — ABNORMAL LOW (ref >39.00)
LDL CALC: 101 mg/dL — AB (ref 0–99)
NONHDL: 120.7
TRIGLYCERIDES: 99 mg/dL (ref 0.0–149.0)
VLDL: 19.8 mg/dL (ref 0.0–40.0)

## 2017-03-15 LAB — HEMOGLOBIN A1C: HEMOGLOBIN A1C: 5.5 % (ref 4.6–6.5)

## 2017-03-15 LAB — BASIC METABOLIC PANEL
BUN: 25 mg/dL — ABNORMAL HIGH (ref 6–23)
CALCIUM: 9.1 mg/dL (ref 8.4–10.5)
CO2: 28 mEq/L (ref 19–32)
Chloride: 106 mEq/L (ref 96–112)
Creatinine, Ser: 0.75 mg/dL (ref 0.40–1.50)
GFR: 104.25 mL/min (ref >60.00)
GLUCOSE: 108 mg/dL — AB (ref 70–99)
POTASSIUM: 3.8 meq/L (ref 3.5–5.1)
SODIUM: 141 meq/L (ref 135–145)

## 2017-03-15 LAB — HEPATIC FUNCTION PANEL
ALT: 6 U/L (ref 0–53)
AST: 12 U/L (ref 0–37)
Albumin: 4 g/dL (ref 3.5–5.2)
Alkaline Phosphatase: 62 U/L (ref 39–117)
BILIRUBIN DIRECT: 0.1 mg/dL (ref 0.0–0.3)
TOTAL PROTEIN: 6.4 g/dL (ref 6.0–8.3)
Total Bilirubin: 0.5 mg/dL (ref 0.2–1.2)

## 2017-03-15 MED ORDER — CYANOCOBALAMIN 1000 MCG/ML IJ SOLN
1000.0000 ug | Freq: Once | INTRAMUSCULAR | Status: AC
  Administered 2017-03-15: 1000 ug via INTRAMUSCULAR

## 2017-03-15 NOTE — Progress Notes (Signed)
Pre visit review using our clinic review tool, if applicable. No additional management support is needed unless otherwise documented below in the visit note. 

## 2017-03-15 NOTE — Progress Notes (Signed)
Patient ID: Carlos Hayden., male   DOB: 1925/10/29, 83 y.o.   MRN: [id number]   Subjective:    Patient ID: Carlos Hayden., male    DOB: 04/11/1926, 83 y.o.   MRN: [id number]  HPI  Patient here as a work in to discuss memory issues.  He reports that he has noticed a gradual decline in his memory.  On questioning, he states he just cannot remember like he used to.  States has no problems driving.  Does not get lost.  Had trouble remembering the name of his pharmacy, but knew things around the pharmacy.  Increased stress with his daughter's health issues. Denies depression.  Tries to stay active.  Discussed living situation.  Discussed if interesedt in assisted living.  Not ready for this.  States has no problems paying his bills.  Recently found to have low B12 level.  Never came in for injections.  No chest pain. No headache or dizziness.  No sob.  No acid reflux reported.  No abdominal pain.  Bowels stable.     Past Medical History:  Diagnosis Date  . Cancer ([place]) 11/28/10   Prostate  . Hypertension    Past Surgical History:  Procedure Laterality Date  . TONSILLECTOMY  1930's  . TOTAL KNEE ARTHROPLASTY     right   Family History  Problem Relation Age of Onset  . Prostate cancer Father   . Lung cancer Mother   . Cancer Daughter   . Stroke Daughter   . Heart attack Daughter    Social History   Social History  . Marital status: Married    Spouse name: N/A  . Number of children: N/A  . Years of education: N/A   Social History Main Topics  . Smoking status: Former Research scientist (life sciences)  . Smokeless tobacco: Never Used     Comment: QUIT SMOKING IN THE EARLY 50'S  . Alcohol use No  . Drug use: No  . Sexual activity: Not Asked   Other Topics Concern  . None   Social History Narrative  . None    Outpatient Encounter Prescriptions as of 03/15/2017  Medication Sig  . lisinopril (PRINIVIL,ZESTRIL) 2.5 MG tablet TAKE ONE (1) TABLET EACH DAY  . Multiple Vitamin  (MULTIVITAMIN) tablet Take 1 tablet by mouth daily.  . naproxen (NAPROSYN) 375 MG tablet Take 1 tablet (375 mg total) by mouth 2 (two) times daily with a meal.  . psyllium (METAMUCIL) 58.6 % powder Take 1 packet by mouth daily.  . [EXPIRED] cyanocobalamin ((VITAMIN B-12)) injection 1,000 mcg    No facility-administered encounter medications on file as of 03/15/2017.     Review of Systems  Constitutional: Negative for appetite change and unexpected weight change.  HENT: Negative for congestion and sinus pressure.   Respiratory: Negative for cough, chest tightness and shortness of breath.   Cardiovascular: Negative for chest pain, palpitations and leg swelling.  Gastrointestinal: Negative for abdominal pain, diarrhea, nausea and vomiting.  Genitourinary: Negative for difficulty urinating and dysuria.  Musculoskeletal: Negative for back pain and joint swelling.  Skin: Negative for color change and rash.  Neurological: Negative for dizziness, light-headedness and headaches.  Psychiatric/Behavioral: Negative for agitation and dysphoric mood.       Memory change as outlined.         Objective:     Blood pressure rechecked by me:  140/68  Physical Exam  Constitutional: He appears well-developed and well-nourished. No distress.  HENT:  Nose: Nose normal.  Mouth/Throat: Oropharynx is clear and moist.  Neck: Neck supple. No thyromegaly present.  Cardiovascular: Normal rate and regular rhythm.   Pulmonary/Chest: Effort normal and breath sounds normal. No respiratory distress.  Abdominal: Soft. Bowel sounds are normal. There is no tenderness.  Musculoskeletal: He exhibits no edema or tenderness.  Lymphadenopathy:    He has no cervical adenopathy.  Skin: No rash noted. No erythema.  Psychiatric: He has a normal mood and affect. His behavior is normal.    BP (!) 144/60   Pulse (!) 56   Temp 97.8 F (36.6 C) (Oral)   Ht 6' (1.829 m)   Wt 163 lb 6.4 oz (74.1 kg)   SpO2 97%   BMI  22.16 kg/m  Wt Readings from Last 3 Encounters:  03/15/17 163 lb 6.4 oz (74.1 kg)  11/30/16 162 lb 12.8 oz (73.8 kg)  11/26/16 161 lb 12.8 oz (73.4 kg)     Lab Results  Component Value Date   WBC 5.1 11/26/2016   HGB 14.4 11/26/2016   HCT 41.9 11/26/2016   PLT 169.0 11/26/2016   GLUCOSE 108 (H) 03/15/2017   CHOL 155 03/15/2017   TRIG 99.0 03/15/2017   HDL 34.00 (L) 03/15/2017   LDLCALC 101 (H) 03/15/2017   ALT 6 03/15/2017   AST 12 03/15/2017   NA 141 03/15/2017   K 3.8 03/15/2017   CL 106 03/15/2017   CREATININE 0.75 03/15/2017   BUN 25 (H) 03/15/2017   CO2 28 03/15/2017   TSH 3.10 11/26/2016   PSA 5.58 01/21/2006   INR 1.1 10/26/2014   HGBA1C 5.5 03/15/2017    Dg Ribs Unilateral W/chest Right  Result Date: 03/26/2016 CLINICAL DATA:  [place] [address] backwards getting out of bed this morning with injury to right lower posterior ribs. EXAM: RIGHT RIBS AND CHEST - 3+ VIEW COMPARISON:  None. FINDINGS: The lungs are adequately inflated without focal consolidation or effusion. Small nodular density versus vascular structure in the left suprahilar region. Cardiomediastinal silhouette is within normal. There is mild calcified plaque over the aortic arch. No evidence of right rib fracture. Mild degenerate change of the spine and right shoulder. IMPRESSION: No acute cardiopulmonary disease. No acute rib fracture. Small nodular density in the left suprahilar region versus prominent vessel. Recommend follow-up chest radiograph 3 months. Electronically Signed   By: Marin Olp M.D.   On: 03/26/2016 13:30       Assessment & Plan:   Problem List Items Addressed This Visit    Essential hypertension    Blood pressure under good control.  Continue same medication regimen.  Follow pressures.  Follow metabolic panel.        Relevant Orders   Basic metabolic panel (Completed)   History of prostate cancer    Has been followed by Dr Jacqlyn Larsen.        Hypercholesterolemia    Follow lipid panel and  liver function tests.        Relevant Orders   Hepatic function panel (Completed)   Lipid panel (Completed)   Memory change - Primary    Discussed at length with him today.  Unable to recall 3/3 objects.  Knew year and day of week.  Stated July when asked month.  Start B12 injections.  b12 low and he never followed through with injections.  Discussed MRI.  Will refer to neurology for further evaluation and w/up.  Pt in agreement.        Relevant Orders   Hemoglobin A1c (Completed)   POCT  Glucose (Device for Home Use) (Completed)   Ambulatory referral to Neurology    Other Visit Diagnoses    B12 deficiency       Relevant Medications   cyanocobalamin ((VITAMIN B-12)) injection 1,000 mcg (Completed)       Einar Pheasant, MD

## 2017-03-16 ENCOUNTER — Encounter: Payer: Self-pay | Admitting: Internal Medicine

## 2017-03-16 NOTE — Assessment & Plan Note (Signed)
Follow lipid panel and liver function tests.

## 2017-03-16 NOTE — Assessment & Plan Note (Signed)
Blood pressure under good control.  Continue same medication regimen.  Follow pressures.  Follow metabolic panel.   

## 2017-03-16 NOTE — Assessment & Plan Note (Signed)
Discussed at length with him today.  Unable to recall 3/3 objects.  Knew year and day of week.  Stated July when asked month.  Start B12 injections.  b12 low and he never followed through with injections.  Discussed MRI.  Will refer to neurology for further evaluation and w/up.  Pt in agreement.

## 2017-03-16 NOTE — Assessment & Plan Note (Signed)
Has been followed by Dr Cope.   

## 2017-03-21 ENCOUNTER — Ambulatory Visit (INDEPENDENT_AMBULATORY_CARE_PROVIDER_SITE_OTHER): Payer: Medicare PPO

## 2017-03-21 DIAGNOSIS — E538 Deficiency of other specified B group vitamins: Secondary | ICD-10-CM | POA: Diagnosis not present

## 2017-03-21 MED ORDER — CYANOCOBALAMIN 1000 MCG/ML IJ SOLN
1000.0000 ug | Freq: Once | INTRAMUSCULAR | Status: AC
  Administered 2017-03-21: 1000 ug via INTRAMUSCULAR

## 2017-03-21 NOTE — Progress Notes (Signed)
Patient came in for B12 injection, received in   Deltoid.  Patient tolerated well.   Please advise in absence of PCP, thanks

## 2017-04-01 ENCOUNTER — Encounter: Payer: Self-pay | Admitting: Internal Medicine

## 2017-04-01 ENCOUNTER — Ambulatory Visit (INDEPENDENT_AMBULATORY_CARE_PROVIDER_SITE_OTHER): Payer: Medicare PPO | Admitting: Internal Medicine

## 2017-04-01 VITALS — BP 138/62 | HR 63 | Temp 98.7°F | Resp 12 | Ht 72.0 in | Wt 162.6 lb

## 2017-04-01 DIAGNOSIS — E7800 Pure hypercholesterolemia, unspecified: Secondary | ICD-10-CM | POA: Diagnosis not present

## 2017-04-01 DIAGNOSIS — Z8546 Personal history of malignant neoplasm of prostate: Secondary | ICD-10-CM | POA: Diagnosis not present

## 2017-04-01 DIAGNOSIS — R413 Other amnesia: Secondary | ICD-10-CM

## 2017-04-01 DIAGNOSIS — I10 Essential (primary) hypertension: Secondary | ICD-10-CM

## 2017-04-01 MED ORDER — CYANOCOBALAMIN 1000 MCG/ML IJ SOLN
1000.0000 ug | Freq: Once | INTRAMUSCULAR | Status: AC
  Administered 2017-04-01: 1000 ug via INTRAMUSCULAR

## 2017-04-01 NOTE — Progress Notes (Signed)
Pre-visit discussion using our clinic review tool. No additional management support is needed unless otherwise documented below in the visit note.  

## 2017-04-01 NOTE — Progress Notes (Signed)
Patient ID: Carlos Clark., male   DOB: [birth year], 83 y.o.   MRN: [id number]   Subjective:    Patient ID: Carlos Clark., male    DOB: Aug 03, 1926, 83 y.o.   MRN: [id number]  HPI  Patient here for a scheduled follow up.  He is accompanied by his wife.  History obtained from both of them.  Last visit discussed concerns regarding memory change.  See last note for details.  His wife agrees there has been a change in his memory.  States does not remember things as well.  No problem driving, but they only drive locally and short distances.  No headache.  No dizziness.  No chest pain.  Breathing stable.  States he is eating.  No nausea or vomiting.  Bowels moving.  Denies depression symptoms.     Past Medical History:  Diagnosis Date  . Cancer ([place]) 11/28/10   Prostate  . Hypertension    Past Surgical History:  Procedure Laterality Date  . TONSILLECTOMY  1930's  . TOTAL KNEE ARTHROPLASTY     right   Family History  Problem Relation Age of Onset  . Prostate cancer Father   . Lung cancer Mother   . Cancer Daughter   . Stroke Daughter   . Heart attack Daughter    Social History   Social History  . Marital status: Married    Spouse name: N/A  . Number of children: N/A  . Years of education: N/A   Social History Main Topics  . Smoking status: Former Research scientist (life sciences)  . Smokeless tobacco: Never Used     Comment: QUIT SMOKING IN THE EARLY 50'S  . Alcohol use No  . Drug use: No  . Sexual activity: Not Asked   Other Topics Concern  . None   Social History Narrative  . None    Outpatient Encounter Prescriptions as of 04/01/2017  Medication Sig  . lisinopril (PRINIVIL,ZESTRIL) 2.5 MG tablet TAKE ONE (1) TABLET EACH DAY  . Multiple Vitamin (MULTIVITAMIN) tablet Take 1 tablet by mouth daily.  . psyllium (METAMUCIL) 58.6 % powder Take 1 packet by mouth daily.  . [EXPIRED] cyanocobalamin ((VITAMIN B-12)) injection 1,000 mcg    No facility-administered encounter  medications on file as of 04/01/2017.     Review of Systems     Objective:    Physical Exam  BP 138/62 (BP Location: Left Arm, Patient Position: Sitting, Cuff Size: Normal)   Pulse 63   Temp 98.7 F (37.1 C) (Oral)   Resp 12   Ht 6' (1.829 m)   Wt 162 lb 9.6 oz (73.8 kg)   SpO2 97%   BMI 22.05 kg/m  Wt Readings from Last 3 Encounters:  04/01/17 162 lb 9.6 oz (73.8 kg)  03/15/17 163 lb 6.4 oz (74.1 kg)  11/30/16 162 lb 12.8 oz (73.8 kg)     Lab Results  Component Value Date   WBC 5.1 11/26/2016   HGB 14.4 11/26/2016   HCT 41.9 11/26/2016   PLT 169.0 11/26/2016   GLUCOSE 108 (H) 03/15/2017   CHOL 155 03/15/2017   TRIG 99.0 03/15/2017   HDL 34.00 (L) 03/15/2017   LDLCALC 101 (H) 03/15/2017   ALT 6 03/15/2017   AST 12 03/15/2017   NA 141 03/15/2017   K 3.8 03/15/2017   CL 106 03/15/2017   CREATININE 0.75 03/15/2017   BUN 25 (H) 03/15/2017   CO2 28 03/15/2017   TSH 3.10 11/26/2016   PSA 5.58 01/21/2006  INR 1.1 10/26/2014   HGBA1C 5.5 03/15/2017       Assessment & Plan:   Problem List Items Addressed This Visit    Essential hypertension    Blood pressure under good control.  Continue same medication regimen.  Follow pressures.  Follow metabolic panel.        History of prostate cancer    Has previously been followed by Dr Jacqlyn Larsen.        Hypercholesterolemia    Follow lipid panel.        Memory change - Primary    See last note for details.  Persistent memory change as outlined.  Wife states this has been a more recent change.  Recently found to have low B12.  Continue injections.  Discussed further w/up. He wants to hold on MRI.  Has appt next week with neurology for formal evaluation.        Relevant Medications   cyanocobalamin ((VITAMIN B-12)) injection 1,000 mcg (Completed)       Einar Pheasant, MD

## 2017-04-03 ENCOUNTER — Encounter: Payer: Self-pay | Admitting: Internal Medicine

## 2017-04-03 NOTE — Assessment & Plan Note (Signed)
Has previously been followed by Dr Cope.  

## 2017-04-03 NOTE — Assessment & Plan Note (Signed)
See last note for details.  Persistent memory change as outlined.  Wife states this has been a more recent change.  Recently found to have low B12.  Continue injections.  Discussed further w/up. He wants to hold on MRI.  Has appt next week with neurology for formal evaluation.

## 2017-04-03 NOTE — Assessment & Plan Note (Signed)
Follow lipid panel.   

## 2017-04-03 NOTE — Assessment & Plan Note (Signed)
Blood pressure under good control.  Continue same medication regimen.  Follow pressures.  Follow metabolic panel.   

## 2017-04-09 ENCOUNTER — Ambulatory Visit: Payer: Self-pay

## 2017-04-16 ENCOUNTER — Telehealth: Payer: Self-pay | Admitting: Internal Medicine

## 2017-04-16 NOTE — Telephone Encounter (Signed)
We had discussed the need for neurological evaluation to see if medication would be helpful.  Need to make sure has appt with neurology.

## 2017-04-16 NOTE — Telephone Encounter (Signed)
Pt states that according to his conversation with Dr Nicki Reaper that memory medication would be given to help with memory loss. Please advise? Thank you!  Call pt @ [phone]

## 2017-04-16 NOTE — Telephone Encounter (Signed)
Not sure what medications he is asking about. I do not see where he was seen at neurology yet?

## 2017-04-17 NOTE — Telephone Encounter (Signed)
Patient had app in July but no showed do you want me to call and r/s for patient?

## 2017-04-18 NOTE — Telephone Encounter (Signed)
Yes.  He needs appt.

## 2017-04-19 ENCOUNTER — Telehealth: Payer: Self-pay | Admitting: *Deleted

## 2017-04-19 NOTE — Telephone Encounter (Signed)
Called to make app office closed. Will need to call back Monday

## 2017-04-19 NOTE — Telephone Encounter (Signed)
Pt daughter has requested a medication refill for psyllium and lisinopril  Pharmacy [facility]

## 2017-04-22 ENCOUNTER — Other Ambulatory Visit: Payer: Self-pay

## 2017-04-22 DIAGNOSIS — I10 Essential (primary) hypertension: Secondary | ICD-10-CM

## 2017-04-22 MED ORDER — LISINOPRIL 2.5 MG PO TABS: ORAL_TABLET | ORAL | 0 refills | Status: DC

## 2017-04-22 NOTE — Telephone Encounter (Signed)
Sent to pharmacy 

## 2017-04-23 ENCOUNTER — Ambulatory Visit: Payer: Self-pay

## 2017-04-23 NOTE — Telephone Encounter (Signed)
Called app made with NP at [facility] for aug 3 at 10:30

## 2017-04-23 NOTE — Telephone Encounter (Signed)
Called patient let him know have note to call and remind him day before.

## 2017-05-02 NOTE — Telephone Encounter (Signed)
Called patient reminded about app at [facility]

## 2017-05-03 DIAGNOSIS — R413 Other amnesia: Secondary | ICD-10-CM | POA: Diagnosis not present

## 2017-05-07 ENCOUNTER — Ambulatory Visit: Payer: Self-pay

## 2017-05-16 ENCOUNTER — Telehealth: Payer: Self-pay | Admitting: Internal Medicine

## 2017-05-16 NOTE — Telephone Encounter (Signed)
Faxed referral to [facility] attention Donna Gilchrist Intake Coordinator for [facility]. 

## 2017-05-20 ENCOUNTER — Telehealth: Payer: Self-pay | Admitting: *Deleted

## 2017-05-20 NOTE — Telephone Encounter (Signed)
A neighbor called and requested a well check, due to patient having possible dementia.  The person called is named Charlie Moffett : [phone]  

## 2017-05-20 NOTE — Telephone Encounter (Signed)
Per last o/v with wife does not want to talk to anyone other than his self or wife.

## 2017-06-25 ENCOUNTER — Ambulatory Visit: Payer: Self-pay | Admitting: Internal Medicine

## 2017-06-25 DIAGNOSIS — Z0289 Encounter for other administrative examinations: Secondary | ICD-10-CM

## 2017-06-26 ENCOUNTER — Emergency Department
Admission: EM | Admit: 2017-06-26 | Discharge: 2017-06-26 | Disposition: A | Discharge status: Home / Self Care | Payer: Medicare PPO | Attending: Emergency Medicine | Admitting: Emergency Medicine

## 2017-06-26 ENCOUNTER — Encounter: Payer: Self-pay | Admitting: Emergency Medicine

## 2017-06-26 ENCOUNTER — Emergency Department: Payer: Medicare PPO

## 2017-06-26 DIAGNOSIS — G3184 Mild cognitive impairment, so stated: Secondary | ICD-10-CM | POA: Diagnosis not present

## 2017-06-26 DIAGNOSIS — R197 Diarrhea, unspecified: Secondary | ICD-10-CM | POA: Diagnosis not present

## 2017-06-26 DIAGNOSIS — Z96651 Presence of right artificial knee joint: Secondary | ICD-10-CM | POA: Diagnosis not present

## 2017-06-26 DIAGNOSIS — Z8546 Personal history of malignant neoplasm of prostate: Secondary | ICD-10-CM | POA: Diagnosis not present

## 2017-06-26 DIAGNOSIS — I714 Abdominal aortic aneurysm, without rupture, unspecified: Secondary | ICD-10-CM

## 2017-06-26 DIAGNOSIS — I10 Essential (primary) hypertension: Secondary | ICD-10-CM | POA: Diagnosis not present

## 2017-06-26 DIAGNOSIS — Z79899 Other long term (current) drug therapy: Secondary | ICD-10-CM | POA: Insufficient documentation

## 2017-06-26 DIAGNOSIS — K6289 Other specified diseases of anus and rectum: Secondary | ICD-10-CM | POA: Diagnosis not present

## 2017-06-26 LAB — CBC WITH DIFFERENTIAL/PLATELET
BASOS ABS: 0 10*3/uL (ref 0–0.1)
BASOS PCT: 0 %
EOS ABS: 0 10*3/uL (ref 0–0.7)
EOS PCT: 0 %
HEMATOCRIT: 42.4 % (ref 40.0–52.0)
Hemoglobin: 14.6 g/dL (ref 13.0–18.0)
Lymphocytes Relative: 7 %
Lymphs Abs: 0.8 10*3/uL — ABNORMAL LOW (ref 1.0–3.6)
MCH: 33 pg (ref 26.0–34.0)
MCHC: 34.5 g/dL (ref 32.0–36.0)
MCV: 95.4 fL (ref 80.0–100.0)
MONO ABS: 0.7 10*3/uL (ref 0.2–1.0)
Monocytes Relative: 6 %
NEUTROS ABS: 9.6 10*3/uL — AB (ref 1.4–6.5)
Neutrophils Relative %: 87 %
Platelets: 155 10*3/uL (ref 150–440)
RBC: 4.44 MIL/uL (ref 4.40–5.90)
RDW: 12.6 % (ref 11.5–14.5)
WBC: 11.2 10*3/uL — ABNORMAL HIGH (ref 3.8–10.6)

## 2017-06-26 LAB — HEPATIC FUNCTION PANEL
ALT: 12 U/L — AB (ref 17–63)
AST: 29 U/L (ref 15–41)
Albumin: 4.1 g/dL (ref 3.5–5.0)
Alkaline Phosphatase: 66 U/L (ref 38–126)
BILIRUBIN DIRECT: 0.2 mg/dL (ref 0.1–0.5)
BILIRUBIN INDIRECT: 0.8 mg/dL (ref 0.3–0.9)
BILIRUBIN TOTAL: 1 mg/dL (ref 0.3–1.2)
Total Protein: 7 g/dL (ref 6.5–8.1)

## 2017-06-26 LAB — BASIC METABOLIC PANEL
Anion gap: 12 (ref 5–15)
BUN: 25 mg/dL — AB (ref 6–20)
CALCIUM: 9.1 mg/dL (ref 8.9–10.3)
CO2: 23 mmol/L (ref 22–32)
Chloride: 102 mmol/L (ref 101–111)
Creatinine, Ser: 0.72 mg/dL (ref 0.61–1.24)
GFR calc Af Amer: >60 mL/min (ref >60)
GLUCOSE: 105 mg/dL — AB (ref 65–99)
POTASSIUM: 4.2 mmol/L (ref 3.5–5.1)
Sodium: 137 mmol/L (ref 135–145)

## 2017-06-26 LAB — LACTIC ACID, PLASMA: LACTIC ACID, VENOUS: 1.5 mmol/L (ref 0.5–1.9)

## 2017-06-26 MED ORDER — SODIUM CHLORIDE 0.9 % IV BOLUS (SEPSIS)
1000.0000 mL | Freq: Once | INTRAVENOUS | Status: AC
  Administered 2017-06-26: 1000 mL via INTRAVENOUS

## 2017-06-26 MED ORDER — CIPROFLOXACIN HCL 500 MG PO TABS
750.0000 mg | ORAL_TABLET | Freq: Once | ORAL | Status: AC
  Administered 2017-06-26: 750 mg via ORAL
  Filled 2017-06-26: qty 2

## 2017-06-26 MED ORDER — CIPROFLOXACIN HCL 500 MG PO TABS
ORAL_TABLET | ORAL | Status: AC
  Filled 2017-06-26: qty 1

## 2017-06-26 MED ORDER — IOPAMIDOL (ISOVUE-300) INJECTION 61%
100.0000 mL | Freq: Once | INTRAVENOUS | Status: AC | PRN
  Administered 2017-06-26: 100 mL via INTRAVENOUS

## 2017-06-26 MED ORDER — LOPERAMIDE HCL 2 MG PO CAPS
2.0000 mg | ORAL_CAPSULE | Freq: Once | ORAL | Status: AC
  Administered 2017-06-26: 2 mg via ORAL
  Filled 2017-06-26: qty 1

## 2017-06-26 NOTE — ED Triage Notes (Signed)
Pt arrived via EMS from home for reports of intermittent diarrhea for four days. Pt denies any pain except rectal pain when diarrhea hits. EMS reports 162/86, HR 60, CBG 112. Pt alert and oriented on arrival. No apparent distress noted.

## 2017-06-26 NOTE — Discharge Instructions (Signed)
Please follow-up with your primary care physician in 2 days for recheck of your stomach. Return to the emergency department sooner for any concerns such as fevers, chills, if you cannot eat or drink, or for any other issues whatsoever.  Please make an appointment to follow up with the vascular surgeon in one week for reexamination of your AAA.  It was a pleasure to take care of you today, and thank you for coming to our emergency department.  If you have any questions or concerns before leaving please ask the nurse to grab me and I'm more than happy to go through your aftercare instructions again.  If you were prescribed any opioid pain medication today such as Norco, Vicodin, Percocet, morphine, hydrocodone, or oxycodone please make sure you do not drive when you are taking this medication as it can alter your ability to drive safely.  If you have any concerns once you are home that you are not improving or are in fact getting worse before you can make it to your follow-up appointment, please do not hesitate to call 911 and come back for further evaluation.  Darel Hong, MD  Results for orders placed or performed during the hospital encounter of 25/95/63  Basic metabolic panel  Result Value Ref Range   Sodium 137 135 - 145 mmol/L   Potassium 4.2 3.5 - 5.1 mmol/L   Chloride 102 101 - 111 mmol/L   CO2 23 22 - 32 mmol/L   Glucose, Bld 105 (H) 65 - 99 mg/dL   BUN 25 (H) 6 - 20 mg/dL   Creatinine, Ser 0.72 0.61 - 1.24 mg/dL   Calcium 9.1 8.9 - 10.3 mg/dL   GFR calc non Af Amer >60 >60 mL/min   GFR calc Af Amer >60 >60 mL/min   Anion gap 12 5 - 15  Hepatic function panel  Result Value Ref Range   Total Protein 7.0 6.5 - 8.1 g/dL   Albumin 4.1 3.5 - 5.0 g/dL   AST 29 15 - 41 U/L   ALT 12 (L) 17 - 63 U/L   Alkaline Phosphatase 66 38 - 126 U/L   Total Bilirubin 1.0 0.3 - 1.2 mg/dL   Bilirubin, Direct 0.2 0.1 - 0.5 mg/dL   Indirect Bilirubin 0.8 0.3 - 0.9 mg/dL  CBC with Differential    Result Value Ref Range   WBC 11.2 (H) 3.8 - 10.6 K/uL   RBC 4.44 4.40 - 5.90 MIL/uL   Hemoglobin 14.6 13.0 - 18.0 g/dL   HCT 42.4 40.0 - 52.0 %   MCV 95.4 80.0 - 100.0 fL   MCH 33.0 26.0 - 34.0 pg   MCHC 34.5 32.0 - 36.0 g/dL   RDW 12.6 11.5 - 14.5 %   Platelets 155 150 - 440 K/uL   Neutrophils Relative % 87 %   Neutro Abs 9.6 (H) 1.4 - 6.5 K/uL   Lymphocytes Relative 7 %   Lymphs Abs 0.8 (L) 1.0 - 3.6 K/uL   Monocytes Relative 6 %   Monocytes Absolute 0.7 0.2 - 1.0 K/uL   Eosinophils Relative 0 %   Eosinophils Absolute 0.0 0 - 0.7 K/uL   Basophils Relative 0 %   Basophils Absolute 0.0 0 - 0.1 K/uL  Lactic acid, plasma  Result Value Ref Range   Lactic Acid, Venous 1.5 0.5 - 1.9 mmol/L   Ct Abdomen Pelvis W Contrast  Result Date: 06/26/2017 CLINICAL DATA:  Four-day history of intermittent diarrhea and rectal pain. Personal history of prostate  cancer. EXAM: CT ABDOMEN AND PELVIS WITH CONTRAST TECHNIQUE: Multidetector CT imaging of the abdomen and pelvis was performed using the standard protocol following bolus administration of intravenous contrast. CONTRAST:  152mL ISOVUE-300 IOPAMIDOL INJECTION 61% IV. Oral contrast was not administered. COMPARISON:  MRI pelvis with special attention to the prostate gland 08/29/2010. No prior CT. FINDINGS: Respiratory motion blurred images of the lung bases in the upper abdomen. Lower chest: Emphysematous changes in the visualized lung bases. Minimal atelectasis or scarring in the left lower lobe. Visualized lung bases otherwise clear. Heart size upper normal. Hepatobiliary: Liver normal in size and appearance. Gallbladder normal in appearance without calcified gallstones. No biliary ductal dilation. Pancreas: Diffuse pancreatic atrophy. Cystic mass involving the body of the pancreas measuring approximately 1.0 x 1.6 cm which appears to be associated with the main pancreatic duct. No peripancreatic inflammation. Spleen: Normal in size and appearance.  Partially calcified 1.5 cm aneurysm involving the splenic artery near the hilum. Adrenals/Urinary Tract: Normal appearing adrenal glands. Kidneys normal in size and appearance without focal parenchymal abnormality. No evidence of urinary tract calculi or obstruction. Normal-appearing urinary bladder. Stomach/Bowel: Stomach normal in appearance for the degree of distention. Normal-appearing small bowel. Scattered sigmoid colon diverticula without evidence of acute diverticulitis. Remainder of the colon unremarkable. Large rectal stool burden with moderate stool burden elsewhere in the colon. Normal appendix in the right upper pelvis. Vascular/Lymphatic: Aortoiliofemoral atherosclerosis. Infrarenal abdominal aortic aneurysm with substantial mural thrombus, maximum diameter approximately 5.0 cm. Ectatic bilateral common iliac arteries. Normal-appearing portal venous and systemic venous systems. No pathologic lymphadenopathy. Reproductive: Normal sized prostate gland containing extensive calcifications. Normal seminal vesicles. Other: None. Musculoskeletal: Diffuse degenerative disc disease, spondylosis and facet degenerative changes throughout the lumbar spine with severe multifactorial spinal stenosis at the L3-4 and L4-5 levels. Degenerative changes in both hips. IMPRESSION: 1. No acute abnormalities involving the abdomen or pelvis. 2. Infrarenal abdominal aortic aneurysm with maximum diameter 5.0 cm. Extensive mural thrombus is present within the aneurysm. Recommend followup by abdomen and pelvis CTA in 3-6 months, and vascular surgery referral/consultation if not already obtained. This recommendation follows ACR consensus guidelines: White Paper of the ACR Incidental Findings Committee II on Vascular Findings. J Am Coll Radiol 2013; 10:789-794. 3. Approximate 1.6 cm cystic mass involving the body of the pancreas, associated with the pancreatic duct. Recommend follow up pre and post contrast MRI/MRCP or pancreatic  protocol CT in 2 years. This recommendation follows ACR consensus guidelines: Management of Incidental Pancreatic Cysts: A White Paper of the ACR Incidental Findings Committee. J Am Coll Radiol 9629;52:841-324. 4. Severe multifactorial spinal stenosis at the L3-4 and L4-5 levels. Aortic aneurysm NOS (ICD10-I71.9). Emphysema (ICD10-J43.9). Electronically Signed   By: Evangeline Dakin M.D.   On: 06/26/2017 20:17

## 2017-06-26 NOTE — ED Notes (Signed)
Patient transported to CT at this time. 

## 2017-06-26 NOTE — ED Provider Notes (Signed)
[facility] Emergency Department Provider Note  ____________________________________________   First MD Initiated Contact with Patient 06/26/17 1817     (approximate)  I have reviewed the triage vital signs and the nursing notes.   HISTORY  Chief Complaint Diarrhea  level V exemption history Limited by the patient's dementia  HPI Carlos Hayden. is a 83 y.o. male who comes to the emergency department with his neighbor for several days of loose stools. The patient has significant dementia and his neighbor is his caretaker. According to the patient for the past several days he has had moderate severity cramping lower abdominal discomfort and has had 3-4 bowel movements a day. It is not loose or watery. She's had no fevers or chills. No chest pain or shortness of breath.  No nausea or vomiting. No history of abdominal surgeries. Nothing seems to make symptoms better or worse.   Past Medical History:  Diagnosis Date  . Cancer ([place]) 11/28/10   Prostate  . Hypertension     Patient Active Problem List   Diagnosis Date Noted  . Memory change 03/15/2017  . Unsteady gait 07/11/2014  . Essential hypertension 05/25/2008  . Hypercholesterolemia 04/14/2008  . History of prostate cancer 04/14/2008  . VASOVAGAL SYNCOPE 08/05/2007    Past Surgical History:  Procedure Laterality Date  . TONSILLECTOMY  1930's  . TOTAL KNEE ARTHROPLASTY     right    Prior to Admission medications   Medication Sig Start Date End Date Taking? Authorizing Provider  donepezil (ARICEPT) 5 MG tablet Take 5 mg by mouth at bedtime.   Yes [provider]  lisinopril (PRINIVIL,ZESTRIL) 2.5 MG tablet TAKE ONE (1) TABLET EACH DAY 04/22/17  Yes Einar Pheasant, MD    Allergies Patient has no known allergies.  Family History  Problem Relation Age of Onset  . Prostate cancer Father   . Lung cancer Mother   . Cancer Daughter   . Stroke Daughter   . Heart attack  Daughter     Social History Social History  Substance Use Topics  . Smoking status: Former Research scientist (life sciences)  . Smokeless tobacco: Never Used     Comment: QUIT SMOKING IN THE EARLY 50'S  . Alcohol use No    Review of Systems level V exemption history Limited by the patient's dementia ____________________________________________   PHYSICAL EXAM:  VITAL SIGNS: ED Triage Vitals  Enc Vitals Group     BP      Pulse      Resp      Temp      Temp src      SpO2      Weight      Height      Head Circumference      Peak Flow      Pain Score      Pain Loc      Pain Edu?      Excl. in [place]?    Constitutional: pleasant cooperative no acute distress Eyes: PERRL EOMI. Head: Atraumatic. Nose: No congestion/rhinnorhea. Mouth/Throat: No trismus Neck: No stridor.   Cardiovascular: Normal rate, regular rhythm. Grossly normal heart sounds.  Good peripheral circulation. Respiratory: Normal respiratory effort.  No retractions. Lungs CTAB and moving good air Gastrointestinal: soft mild diffuse tenderness with no focality no peritonitis Musculoskeletal: No lower extremity edema   Neurologic:  Normal speech and language. No gross focal neurologic deficits are appreciated. Skin:  Skin is warm, dry and intact. No rash noted. Psychiatric: mild dementia  noted    ____________________________________________   DIFFERENTIAL includes but not limited to  appendicitis, diverticulitis, volvulus, Clostridium difficile ____________________________________________   LABS (all labs ordered are listed, but only abnormal results are displayed)  Labs Reviewed  BASIC METABOLIC PANEL - Abnormal; Notable for the following:       Result Value   Glucose, Bld 105 (*)    BUN 25 (*)    All other components within normal limits  HEPATIC FUNCTION PANEL - Abnormal; Notable for the following:    ALT 12 (*)    All other components within normal limits  CBC WITH DIFFERENTIAL/PLATELET - Abnormal; Notable for the  following:    WBC 11.2 (*)    Neutro Abs 9.6 (*)    Lymphs Abs 0.8 (*)    All other components within normal limits  GASTROINTESTINAL PANEL BY PCR, STOOL (REPLACES STOOL CULTURE)  LACTIC ACID, PLASMA  URINALYSIS, COMPLETE (UACMP) WITH MICROSCOPIC    blood work reviewed and interpreted by me as essentially normal. Slightly elevated white count is nonspecific __________________________________________  EKG   ____________________________________________  RADIOLOGY  CT scan of the abdomen and pelvis reviewed by me and shows no acute disease although does show a 5.0 cm infrarenal AAA ____________________________________________   PROCEDURES  Procedure(s) performed: no  Procedures  Critical Care performed: no  Observation: no ____________________________________________   INITIAL IMPRESSION / ASSESSMENT AND PLAN / ED COURSE  Pertinent labs & imaging results that were available during my care of the patient were reviewed by me and considered in my medical decision making (see chart for details).  The patient arrives hemodynamically stable and well appearing although with mildly tender abdomen and diarrhea. Differential is extremely broad and he'll require CT scan.     ----------------------------------------- 9:25 PM on 06/26/2017 -----------------------------------------  The patient has an incidental finding of 5.0 cm triple-A infrarenal. I discussed the case with on-call vascular surgeon Dr. due who indicated there is no indication for emergent repair but he does recommend follow-up in clinic in one week. The patient verbalizes understanding and agreement with the plan.  given a single dose of 750 mg of ciprofloxacin now in case this is actually dysentery. ____________________________________________   FINAL CLINICAL IMPRESSION(S) / ED DIAGNOSES  Final diagnoses:  Abdominal aortic aneurysm (AAA) without rupture (HCC)  Diarrhea, unspecified type      NEW  MEDICATIONS STARTED DURING THIS VISIT:  Discharge Medication List as of 06/26/2017  9:23 PM       Note:  This document was prepared using Dragon voice recognition software and may include unintentional dictation errors.     Darel Hong, MD 06/26/17 [phone]

## 2017-06-27 ENCOUNTER — Telehealth: Payer: Self-pay | Admitting: Internal Medicine

## 2017-06-27 NOTE — Telephone Encounter (Signed)
Pt's neighbor called and stated that they brought pt to the ED last night because pt had diarrhea for 3 days. They need to make an appt with Dr. Nicki Reaper. Please advise, thank you!  Call [phone]

## 2017-06-27 NOTE — Telephone Encounter (Signed)
Pt was transferred to [facility] for further eval. Thank you!

## 2017-06-27 NOTE — Telephone Encounter (Signed)
Patient Name: SHALAMAR [facility]  DOB: 1926-03-25    Initial Comment Caller states that he has had diarrhea for 3 days. His rectum is sore.   Nurse Assessment  Nurse: Raphael Gibney, RN, Vanita Ingles Date/Time (Eastern Time): 06/27/2017 1:34:50 PM  Confirm and document reason for call. If symptomatic, describe symptoms. ---Caller states friend has had diarrhea for 3 days. Went to the ER yesterday for diarrhea. He had IV fluids. He was discharged from the ER. Has a sore rectum. No bleeding. Scan of abd was negative. No fever. Was told to follow up with Dr. Nicki Reaper and she has called the office for appt and no one has called her back. Diarrhea is less than yesterday.  Does the patient have any new or worsening symptoms? ---Yes  Will a triage be completed? ---Yes  Related visit to physician within the last 2 weeks? ---No  Does the PT have any chronic conditions? (i.e. diabetes, asthma, etc.) ---No  Is this a behavioral health or substance abuse call? ---No     Guidelines    Guideline Title Affirmed Question Affirmed Notes  Rectal Symptoms MODERATE-SEVERE rectal pain (i.e., interferes with school, work, or sleep)    Final Disposition User   See Physician within 24 Hours [place], Therapist, sports, [place]    Comments  no appts available within 24 hrs at [facility] [employer]. Pt does not want to go to another office. Please call pt back regarding appt.   Referrals  GO TO FACILITY REFUSED   Caller Disagree/Comply Disagree  Caller Understands Yes  PreDisposition Call Doctor

## 2017-06-27 NOTE — Telephone Encounter (Signed)
Where do you want to work him in?

## 2017-06-27 NOTE — Telephone Encounter (Signed)
fyi

## 2017-06-27 NOTE — Telephone Encounter (Signed)
Another message open and sent to Dr. Nicki Reaper.

## 2017-06-28 NOTE — Telephone Encounter (Signed)
Called and spoke to wife. He was resting. She states that he is feeling better I have gave them app for Wednesday. She will have him call office when he gets up so I can check on him.   Can you put patient in 07/03/17 at 12:30?

## 2017-06-28 NOTE — Telephone Encounter (Signed)
I can see him on Wednesday 07/03/17 at 12:30.  Please call pt and confirm how he is doing.  Per note, diarrhea has resolved.  Rectal pain - can use analpram cream - apply topically bid.  Can call in rx.

## 2017-07-01 NOTE — Telephone Encounter (Signed)
He is in

## 2017-07-02 NOTE — Telephone Encounter (Signed)
Carlos Hayden had left a vm on referral line stating that he is returning a call to [place]. Pt cb [phone]

## 2017-07-02 NOTE — Telephone Encounter (Signed)
Left message to return call to our office.  

## 2017-07-03 ENCOUNTER — Ambulatory Visit (INDEPENDENT_AMBULATORY_CARE_PROVIDER_SITE_OTHER): Payer: Self-pay | Admitting: Internal Medicine

## 2017-07-03 ENCOUNTER — Encounter: Payer: Self-pay | Admitting: Internal Medicine

## 2017-07-03 DIAGNOSIS — D696 Thrombocytopenia, unspecified: Secondary | ICD-10-CM

## 2017-07-03 DIAGNOSIS — Z8546 Personal history of malignant neoplasm of prostate: Secondary | ICD-10-CM

## 2017-07-03 DIAGNOSIS — K8689 Other specified diseases of pancreas: Secondary | ICD-10-CM

## 2017-07-03 DIAGNOSIS — I714 Abdominal aortic aneurysm, without rupture: Secondary | ICD-10-CM

## 2017-07-03 DIAGNOSIS — Z23 Encounter for immunization: Secondary | ICD-10-CM

## 2017-07-03 DIAGNOSIS — E7800 Pure hypercholesterolemia, unspecified: Secondary | ICD-10-CM

## 2017-07-03 DIAGNOSIS — K869 Disease of pancreas, unspecified: Secondary | ICD-10-CM

## 2017-07-03 DIAGNOSIS — R413 Other amnesia: Secondary | ICD-10-CM

## 2017-07-03 DIAGNOSIS — I7143 Infrarenal abdominal aortic aneurysm, without rupture: Secondary | ICD-10-CM

## 2017-07-03 DIAGNOSIS — I10 Essential (primary) hypertension: Secondary | ICD-10-CM

## 2017-07-03 NOTE — Progress Notes (Addendum)
Patient ID: Carlos Clark., male   DOB: [birth year], 83 y.o.   MRN: [id number]   Subjective:    Patient ID: Carlos Clark., male    DOB: 1926/09/08, 83 y.o.   MRN: [id number]  HPI  Patient here for ER follow up.  He was seen for diarrhea.  This has resolved.  He is eating.  No nausea or vomiting now.  Discussed weight loss.   Discussed their home situation.  States they eat regular meals.  Discussed help in the home.  Discussed meals on wheels.  Their neighbor helps to care for them.   Discussed his recent ER evaluation and scan results.  Discussed the possible need for further w/up regarding the pancreatic lesion.  Also discussed the finding of the infrarenal aortic aneurysm with mural thrombus.  Discussed the need for vascular surgery follow up.  No chest pain.  Breathing stable.  Bowels moving now with no diarrhea.     Past Medical History:  Diagnosis Date  . Cancer ([place]) 11/28/10   Prostate  . Hypertension    Past Surgical History:  Procedure Laterality Date  . TONSILLECTOMY  1930's  . TOTAL KNEE ARTHROPLASTY     right   Family History  Problem Relation Age of Onset  . Prostate cancer Father   . Lung cancer Mother   . Cancer Daughter   . Stroke Daughter   . Heart attack Daughter    Social History   Social History  . Marital status: Married    Spouse name: N/A  . Number of children: N/A  . Years of education: N/A   Social History Main Topics  . Smoking status: Former Research scientist (life sciences)  . Smokeless tobacco: Never Used     Comment: QUIT SMOKING IN THE EARLY 50'S  . Alcohol use No  . Drug use: No  . Sexual activity: Not Asked   Other Topics Concern  . None   Social History Narrative  . None     Review of Systems  Constitutional: Negative for fever.       States is eating.  Weight is up a few pounds from previous check.    HENT: Negative for congestion and sinus pressure.   Respiratory: Negative for cough, chest tightness and shortness of  breath.   Cardiovascular: Negative for chest pain, palpitations and leg swelling.  Gastrointestinal: Negative for abdominal pain, nausea and vomiting.       Diarrhea resolved.    Genitourinary: Negative for difficulty urinating and dysuria.  Musculoskeletal: Negative for joint swelling and myalgias.  Skin: Negative for color change and rash.  Neurological: Negative for dizziness, light-headedness and headaches.  Psychiatric/Behavioral: Negative for agitation and dysphoric mood.       Objective:    Physical Exam  Constitutional: He appears well-developed and well-nourished. No distress.  HENT:  Nose: Nose normal.  Mouth/Throat: Oropharynx is clear and moist.  Neck: Neck supple. No thyromegaly present.  Cardiovascular: Normal rate and regular rhythm.   Pulmonary/Chest: Effort normal and breath sounds normal. No respiratory distress.  Abdominal: Soft. Bowel sounds are normal. There is no tenderness.  Musculoskeletal: He exhibits no edema or tenderness.  Lymphadenopathy:    He has no cervical adenopathy.  Skin: No rash noted. No erythema.  Psychiatric: He has a normal mood and affect. His behavior is normal.    BP 110/62 (BP Location: Left Arm, Patient Position: Sitting, Cuff Size: Normal)   Pulse (!) 52   Temp 98.1 F (36.7 C) (Oral)  Resp 18   Wt 153 lb 6 oz (69.6 kg)   SpO2 96%   BMI 19.69 kg/m  Wt Readings from Last 3 Encounters:  07/03/17 153 lb 6 oz (69.6 kg)  06/26/17 150 lb (68 kg)  04/01/17 162 lb 9.6 oz (73.8 kg)     Lab Results  Component Value Date   WBC 11.2 (H) 06/26/2017   HGB 14.6 06/26/2017   HCT 42.4 06/26/2017   PLT 155 06/26/2017   GLUCOSE 105 (H) 06/26/2017   CHOL 155 03/15/2017   TRIG 99.0 03/15/2017   HDL 34.00 (L) 03/15/2017   LDLCALC 101 (H) 03/15/2017   ALT 12 (L) 06/26/2017   AST 29 06/26/2017   NA 137 06/26/2017   K 4.2 06/26/2017   CL 102 06/26/2017   CREATININE 0.72 06/26/2017   BUN 25 (H) 06/26/2017   CO2 23 06/26/2017   TSH  3.10 11/26/2016   PSA 5.58 01/21/2006   INR 1.1 10/26/2014   HGBA1C 5.5 03/15/2017    Ct Abdomen Pelvis W Contrast  Result Date: 06/26/2017 CLINICAL DATA:  Four-day history of intermittent diarrhea and rectal pain. Personal history of prostate cancer. EXAM: CT ABDOMEN AND PELVIS WITH CONTRAST TECHNIQUE: Multidetector CT imaging of the abdomen and pelvis was performed using the standard protocol following bolus administration of intravenous contrast. CONTRAST:  115mL ISOVUE-300 IOPAMIDOL INJECTION 61% IV. Oral contrast was not administered. COMPARISON:  MRI pelvis with special attention to the prostate gland 08/29/2010. No prior CT. FINDINGS: Respiratory motion blurred images of the lung bases in the upper abdomen. Lower chest: Emphysematous changes in the visualized lung bases. Minimal atelectasis or scarring in the left lower lobe. Visualized lung bases otherwise clear. Heart size upper normal. Hepatobiliary: Liver normal in size and appearance. Gallbladder normal in appearance without calcified gallstones. No biliary ductal dilation. Pancreas: Diffuse pancreatic atrophy. Cystic mass involving the body of the pancreas measuring approximately 1.0 x 1.6 cm which appears to be associated with the main pancreatic duct. No peripancreatic inflammation. Spleen: Normal in size and appearance. Partially calcified 1.5 cm aneurysm involving the splenic artery near the hilum. Adrenals/Urinary Tract: Normal appearing adrenal glands. Kidneys normal in size and appearance without focal parenchymal abnormality. No evidence of urinary tract calculi or obstruction. Normal-appearing urinary bladder. Stomach/Bowel: Stomach normal in appearance for the degree of distention. Normal-appearing small bowel. Scattered sigmoid colon diverticula without evidence of acute diverticulitis. Remainder of the colon unremarkable. Large rectal stool burden with moderate stool burden elsewhere in the colon. Normal appendix in the right upper  pelvis. Vascular/Lymphatic: Aortoiliofemoral atherosclerosis. Infrarenal abdominal aortic aneurysm with substantial mural thrombus, maximum diameter approximately 5.0 cm. Ectatic bilateral common iliac arteries. Normal-appearing portal venous and systemic venous systems. No pathologic lymphadenopathy. Reproductive: Normal sized prostate gland containing extensive calcifications. Normal seminal vesicles. Other: None. Musculoskeletal: Diffuse degenerative disc disease, spondylosis and facet degenerative changes throughout the lumbar spine with severe multifactorial spinal stenosis at the L3-4 and L4-5 levels. Degenerative changes in both hips. IMPRESSION: 1. No acute abnormalities involving the abdomen or pelvis. 2. Infrarenal abdominal aortic aneurysm with maximum diameter 5.0 cm. Extensive mural thrombus is present within the aneurysm. Recommend followup by abdomen and pelvis CTA in 3-6 months, and vascular surgery referral/consultation if not already obtained. This recommendation follows ACR consensus guidelines: White Paper of the ACR Incidental Findings Committee II on Vascular Findings. J Am Coll Radiol 2013; 10:789-794. 3. Approximate 1.6 cm cystic mass involving the body of the pancreas, associated with the pancreatic duct. Recommend follow up  pre and post contrast MRI/MRCP or pancreatic protocol CT in 2 years. This recommendation follows ACR consensus guidelines: Management of Incidental Pancreatic Cysts: A White Paper of the ACR Incidental Findings Committee. J Am Coll Radiol 4163;84:536-468. 4. Severe multifactorial spinal stenosis at the L3-4 and L4-5 levels. Aortic aneurysm NOS (ICD10-I71.9). Emphysema (ICD10-J43.9). Electronically Signed   By: Evangeline Dakin M.D.   On: 06/26/2017 20:17       Assessment & Plan:   Problem List Items Addressed This Visit    Aneurysm of infrarenal abdominal aorta ([place])    Found on recent CT.  Due to see vascular surgery next week.  appt make and given to pt.         Essential hypertension    Not sure how often he is taking medication.  Discussed at length with him.  Will have pharmacist start blister packs to hopefully help assure taking medication properly.        History of prostate cancer    Has previously been followed by Dr Jacqlyn Larsen.       Hypercholesterolemia    Follow lipid panel.        Memory change    See previous note for details.  Saw neurology.  Supposed to be taking aricept.  Discussed the need to take medications regularly.  CMA discussed with pharmacy and they are going to start blister packets with daily medications.  Discussed my desire for them to have increased care in the home or assisted living.  They decline assisted living.  Will see if able to get any help in the home.        Pancreatic mass    Discussed today the results of recent CT scan.  Will have oncology review and give opinion of further w/up.  No nausea, vomiting, abdominal pain or bowel change currently.        Thrombocytopenia ([facility])    Recent platelet count wnl.  Follow.         Other Visit Diagnoses    Encounter for immunization       Relevant Orders   Flu vaccine HIGH DOSE PF (Completed)       Einar Pheasant, MD

## 2017-07-06 DIAGNOSIS — I714 Abdominal aortic aneurysm, without rupture: Secondary | ICD-10-CM | POA: Insufficient documentation

## 2017-07-06 DIAGNOSIS — K8689 Other specified diseases of pancreas: Secondary | ICD-10-CM | POA: Insufficient documentation

## 2017-07-06 DIAGNOSIS — I7143 Infrarenal abdominal aortic aneurysm, without rupture: Secondary | ICD-10-CM | POA: Insufficient documentation

## 2017-07-06 NOTE — Assessment & Plan Note (Addendum)
Found on recent CT.  Due to see vascular surgery next week.  appt make and given to pt.

## 2017-07-06 NOTE — Assessment & Plan Note (Addendum)
Not sure how often he is taking medication.  Discussed at length with him.  Will have pharmacist start blister packs to hopefully help assure taking medication properly.

## 2017-07-06 NOTE — Assessment & Plan Note (Addendum)
Discussed today the results of recent CT scan.  Will have oncology review and give opinion of further w/up.  No nausea, vomiting, abdominal pain or bowel change currently.

## 2017-07-07 ENCOUNTER — Encounter: Payer: Self-pay | Admitting: Internal Medicine

## 2017-07-07 DIAGNOSIS — D696 Thrombocytopenia, unspecified: Secondary | ICD-10-CM | POA: Insufficient documentation

## 2017-07-07 NOTE — Assessment & Plan Note (Signed)
Follow lipid panel.   

## 2017-07-07 NOTE — Assessment & Plan Note (Signed)
See previous note for details.  Saw neurology.  Supposed to be taking aricept.  Discussed the need to take medications regularly.  CMA discussed with pharmacy and they are going to start blister packets with daily medications.  Discussed my desire for them to have increased care in the home or assisted living.  They decline assisted living.  Will see if able to get any help in the home.

## 2017-07-07 NOTE — Assessment & Plan Note (Signed)
Has previously been followed by Dr Jacqlyn Larsen.

## 2017-07-07 NOTE — Assessment & Plan Note (Signed)
Recent platelet count wnl.  Follow.  °

## 2017-07-09 ENCOUNTER — Telehealth: Payer: Self-pay

## 2017-07-09 ENCOUNTER — Encounter (INDEPENDENT_AMBULATORY_CARE_PROVIDER_SITE_OTHER): Payer: Self-pay | Admitting: Vascular Surgery
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
## 2017-07-10 ENCOUNTER — Encounter: Payer: Self-pay | Admitting: Internal Medicine

## 2017-07-10 NOTE — Telephone Encounter (Signed)
Called and let Lala Lund know all is here for pick up.

## 2017-07-10 NOTE — Telephone Encounter (Signed)
Letter typed and marked as printed.  FL2 form completed.  Randi needs this am.

## 2017-09-17 ENCOUNTER — Ambulatory Visit: Payer: Self-pay | Admitting: Internal Medicine

## 2017-10-08 ENCOUNTER — Other Ambulatory Visit: Payer: Self-pay

## 2017-10-08 ENCOUNTER — Observation Stay
Admission: EM | Admit: 2017-10-08 | Discharge: 2017-10-10 | Disposition: A | Discharge status: Home / Self Care | Payer: Medicare PPO | Attending: Internal Medicine | Admitting: Internal Medicine

## 2017-10-08 ENCOUNTER — Encounter: Payer: Self-pay | Admitting: Emergency Medicine

## 2017-10-08 ENCOUNTER — Emergency Department: Payer: Medicare PPO

## 2017-10-08 ENCOUNTER — Telehealth: Payer: Self-pay | Admitting: Internal Medicine

## 2017-10-08 DIAGNOSIS — R55 Syncope and collapse: Secondary | ICD-10-CM | POA: Diagnosis not present

## 2017-10-08 DIAGNOSIS — Z96651 Presence of right artificial knee joint: Secondary | ICD-10-CM | POA: Insufficient documentation

## 2017-10-08 DIAGNOSIS — E7800 Pure hypercholesterolemia, unspecified: Secondary | ICD-10-CM | POA: Diagnosis not present

## 2017-10-08 DIAGNOSIS — Z79899 Other long term (current) drug therapy: Secondary | ICD-10-CM | POA: Insufficient documentation

## 2017-10-08 DIAGNOSIS — Z87891 Personal history of nicotine dependence: Secondary | ICD-10-CM | POA: Diagnosis not present

## 2017-10-08 DIAGNOSIS — Z8546 Personal history of malignant neoplasm of prostate: Secondary | ICD-10-CM | POA: Diagnosis not present

## 2017-10-08 DIAGNOSIS — I7389 Other specified peripheral vascular diseases: Secondary | ICD-10-CM | POA: Insufficient documentation

## 2017-10-08 DIAGNOSIS — J111 Influenza due to unidentified influenza virus with other respiratory manifestations: Secondary | ICD-10-CM

## 2017-10-08 DIAGNOSIS — B348 Other viral infections of unspecified site: Principal | ICD-10-CM | POA: Insufficient documentation

## 2017-10-08 DIAGNOSIS — Z8673 Personal history of transient ischemic attack (TIA), and cerebral infarction without residual deficits: Secondary | ICD-10-CM | POA: Diagnosis not present

## 2017-10-08 DIAGNOSIS — Z23 Encounter for immunization: Secondary | ICD-10-CM | POA: Insufficient documentation

## 2017-10-08 DIAGNOSIS — R079 Chest pain, unspecified: Secondary | ICD-10-CM | POA: Diagnosis not present

## 2017-10-08 DIAGNOSIS — I10 Essential (primary) hypertension: Secondary | ICD-10-CM | POA: Diagnosis not present

## 2017-10-08 DIAGNOSIS — M6281 Muscle weakness (generalized): Secondary | ICD-10-CM | POA: Diagnosis not present

## 2017-10-08 DIAGNOSIS — B349 Viral infection, unspecified: Secondary | ICD-10-CM | POA: Diagnosis present

## 2017-10-08 DIAGNOSIS — E860 Dehydration: Secondary | ICD-10-CM | POA: Insufficient documentation

## 2017-10-08 DIAGNOSIS — F0390 Unspecified dementia without behavioral disturbance: Secondary | ICD-10-CM | POA: Diagnosis not present

## 2017-10-08 DIAGNOSIS — R05 Cough: Secondary | ICD-10-CM | POA: Diagnosis not present

## 2017-10-08 LAB — CBC WITH DIFFERENTIAL/PLATELET
BASOS ABS: 0 10*3/uL (ref 0–0.1)
Basophils Relative: 0 %
Eosinophils Absolute: 0.1 10*3/uL (ref 0–0.7)
Eosinophils Relative: 1 %
HEMATOCRIT: 40.4 % (ref 40.0–52.0)
HEMOGLOBIN: 13.7 g/dL (ref 13.0–18.0)
LYMPHS PCT: 7 %
Lymphs Abs: 0.6 10*3/uL — ABNORMAL LOW (ref 1.0–3.6)
MCH: 33.1 pg (ref 26.0–34.0)
MCHC: 33.9 g/dL (ref 32.0–36.0)
MCV: 97.6 fL (ref 80.0–100.0)
MONO ABS: 1 10*3/uL (ref 0.2–1.0)
Monocytes Relative: 11 %
NEUTROS ABS: 6.9 10*3/uL — AB (ref 1.4–6.5)
NEUTROS PCT: 81 %
Platelets: 152 10*3/uL (ref 150–440)
RBC: 4.14 MIL/uL — AB (ref 4.40–5.90)
RDW: 13.3 % (ref 11.5–14.5)
WBC: 8.6 10*3/uL (ref 3.8–10.6)

## 2017-10-08 LAB — MRSA PCR SCREENING: MRSA by PCR: NEGATIVE

## 2017-10-08 LAB — COMPREHENSIVE METABOLIC PANEL
ALT: 15 U/L — ABNORMAL LOW (ref 17–63)
ANION GAP: 9 (ref 5–15)
AST: 29 U/L (ref 15–41)
Albumin: 3.8 g/dL (ref 3.5–5.0)
Alkaline Phosphatase: 60 U/L (ref 38–126)
BUN: 25 mg/dL — AB (ref 6–20)
CHLORIDE: 105 mmol/L (ref 101–111)
CO2: 24 mmol/L (ref 22–32)
Calcium: 8.4 mg/dL — ABNORMAL LOW (ref 8.9–10.3)
Creatinine, Ser: 0.81 mg/dL (ref 0.61–1.24)
GFR calc Af Amer: >60 mL/min (ref >60)
Glucose, Bld: 111 mg/dL — ABNORMAL HIGH (ref 65–99)
POTASSIUM: 4.2 mmol/L (ref 3.5–5.1)
Sodium: 138 mmol/L (ref 135–145)
Total Bilirubin: 0.4 mg/dL (ref 0.3–1.2)
Total Protein: 6.4 g/dL — ABNORMAL LOW (ref 6.5–8.1)

## 2017-10-08 LAB — URINALYSIS, COMPLETE (UACMP) WITH MICROSCOPIC
Bacteria, UA: NONE SEEN
Bilirubin Urine: NEGATIVE
GLUCOSE, UA: NEGATIVE mg/dL
KETONES UR: NEGATIVE mg/dL
Nitrite: NEGATIVE
PROTEIN: NEGATIVE mg/dL
SQUAMOUS EPITHELIAL / LPF: NONE SEEN
Specific Gravity, Urine: 1.017 (ref 1.005–1.030)
pH: 5 (ref 5.0–8.0)

## 2017-10-08 LAB — INFLUENZA PANEL BY PCR (TYPE A & B)
INFLAPCR: NEGATIVE
INFLBPCR: NEGATIVE

## 2017-10-08 LAB — GLUCOSE, CAPILLARY: Glucose-Capillary: 126 mg/dL — ABNORMAL HIGH (ref 65–99)

## 2017-10-08 LAB — TROPONIN I: Troponin I: <0.03 ng/mL (ref <0.03)

## 2017-10-08 MED ORDER — ENOXAPARIN SODIUM 40 MG/0.4ML ~~LOC~~ SOLN
40.0000 mg | SUBCUTANEOUS | Status: DC
  Administered 2017-10-08 – 2017-10-09 (×2): 40 mg via SUBCUTANEOUS
  Filled 2017-10-08 (×2): qty 0.4

## 2017-10-08 MED ORDER — ACETAMINOPHEN 650 MG RE SUPP: 650.0000 mg | Freq: Four times a day (QID) | RECTAL | Status: DC | PRN

## 2017-10-08 MED ORDER — OSELTAMIVIR PHOSPHATE 75 MG PO CAPS
75.0000 mg | ORAL_CAPSULE | Freq: Once | ORAL | Status: AC
  Administered 2017-10-08: 75 mg via ORAL
  Filled 2017-10-08: qty 1

## 2017-10-08 MED ORDER — DONEPEZIL HCL 5 MG PO TABS
5.0000 mg | ORAL_TABLET | Freq: Every day | ORAL | Status: DC
  Administered 2017-10-09: 23:00:00 5 mg via ORAL
  Filled 2017-10-08 (×2): qty 1

## 2017-10-08 MED ORDER — TETANUS-DIPHTH-ACELL PERTUSSIS 5-2.5-18.5 LF-MCG/0.5 IM SUSP
0.5000 mL | Freq: Once | INTRAMUSCULAR | Status: AC
  Administered 2017-10-08: 0.5 mL via INTRAMUSCULAR
  Filled 2017-10-08: qty 0.5

## 2017-10-08 MED ORDER — ACETAMINOPHEN 500 MG PO TABS
1000.0000 mg | ORAL_TABLET | Freq: Once | ORAL | Status: AC
  Administered 2017-10-08: 1000 mg via ORAL

## 2017-10-08 MED ORDER — SODIUM CHLORIDE 0.9 % IV BOLUS (SEPSIS)
1000.0000 mL | Freq: Once | INTRAVENOUS | Status: AC
Start: 2017-10-08 — End: 2017-10-08
  Administered 2017-10-08: 1000 mL via INTRAVENOUS

## 2017-10-08 MED ORDER — INFLUENZA VAC SPLIT HIGH-DOSE 0.5 ML IM SUSY
0.5000 mL | PREFILLED_SYRINGE | INTRAMUSCULAR | Status: DC
  Filled 2017-10-08: qty 0.5

## 2017-10-08 MED ORDER — TRAMADOL HCL 50 MG PO TABS
50.0000 mg | ORAL_TABLET | Freq: Four times a day (QID) | ORAL | Status: DC | PRN
  Administered 2017-10-09 (×2): 50 mg via ORAL
  Filled 2017-10-08 (×2): qty 1

## 2017-10-08 MED ORDER — SODIUM CHLORIDE 0.9 % IV SOLN
Freq: Once | INTRAVENOUS | Status: AC
  Administered 2017-10-08: 22:00:00 via INTRAVENOUS

## 2017-10-08 MED ORDER — ACETAMINOPHEN 325 MG PO TABS: 650.0000 mg | ORAL_TABLET | Freq: Four times a day (QID) | ORAL | Status: DC | PRN

## 2017-10-08 MED ORDER — POLYETHYLENE GLYCOL 3350 17 G PO PACK
17.0000 g | PACK | Freq: Every day | ORAL | Status: DC | PRN
  Administered 2017-10-10: 17 g via ORAL
  Filled 2017-10-08: qty 1

## 2017-10-08 MED ORDER — PNEUMOCOCCAL VAC POLYVALENT 25 MCG/0.5ML IJ INJ: 0.5000 mL | INJECTION | INTRAMUSCULAR | Status: DC

## 2017-10-08 MED ORDER — ACETAMINOPHEN 500 MG PO TABS
ORAL_TABLET | ORAL | Status: AC
Start: 2017-10-08 — End: 2017-10-09
  Filled 2017-10-08: qty 2

## 2017-10-08 MED ORDER — OSELTAMIVIR PHOSPHATE 75 MG PO CAPS: 75.0000 mg | ORAL_CAPSULE | Freq: Two times a day (BID) | ORAL | 0 refills | Status: DC

## 2017-10-08 MED ORDER — ONDANSETRON HCL 4 MG/2ML IJ SOLN
4.0000 mg | Freq: Four times a day (QID) | INTRAMUSCULAR | Status: DC | PRN
  Filled 2017-10-08: qty 2

## 2017-10-08 MED ORDER — ONDANSETRON HCL 4 MG PO TABS: 4.0000 mg | ORAL_TABLET | Freq: Four times a day (QID) | ORAL | Status: DC | PRN

## 2017-10-08 NOTE — ED Notes (Signed)
Pt attempted to get up to wheelchair for discharge with this RN and another staff member. Unable to stand up, increased weakness. MD made aware, not safe for discharge
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
## 2017-10-08 NOTE — ED Notes (Signed)
Pt cleaned of urine at this time, linen and brief changed.

## 2017-10-08 NOTE — H&P (Signed)
[place] [facility] [place] NAME: Carlos Hayden    MR#:  [id number]  DATE OF BIRTH:  02-20-1926  DATE OF ADMISSION:  10/08/2017  PRIMARY CARE PHYSICIAN: Einar Pheasant, MD   REQUESTING/REFERRING PHYSICIAN: Dr. Mable Paris  CHIEF COMPLAINT:  I passed out at [place]:  Carlos Hayden  is a 84 y.o. male with a known history of hypertension comes to the emergency room with a syncopal episode that happened while patient was standing in line at the [facility] to get his ID renewed. She was brought to the emergency room was found to have found to have fever of 101.9.  He appears clinically dehydrated.  He received some fluids and then was made to walk around by staff however had another near syncopal episode and patient now is being admitted for possible acute viral syndrome/near syncope with dehydration He received first dose of Tamiflu.  His flu test is still pending. Patient has been having dry cough and runny nose for the last couple days. [facility].  Patient is accompanied by daughter and wife  PAST MEDICAL HISTORY:   Past Medical History:  Diagnosis Date  . Cancer ([place]) 11/28/10   Prostate  . Hypertension     PAST SURGICAL HISTOIRY:   Past Surgical History:  Procedure Laterality Date  . TONSILLECTOMY  1930's  . TOTAL KNEE ARTHROPLASTY     right    SOCIAL HISTORY:   Social History   Tobacco Use  . Smoking status: Former Research scientist (life sciences)  . Smokeless tobacco: Never Used  . Tobacco comment: QUIT SMOKING IN THE EARLY 50'S  Substance Use Topics  . Alcohol use: No    Alcohol/week: 0.0 oz    FAMILY HISTORY:   Family History  Problem Relation Age of Onset  . Prostate cancer Father   . Lung cancer Mother   . Cancer Daughter   . Stroke Daughter   . Heart attack Daughter     DRUG ALLERGIES:  No Known Allergies  REVIEW OF SYSTEMS:  Review of Systems  Constitutional: Negative  for chills, fever and weight loss.  HENT: Negative for ear discharge, ear pain and nosebleeds.   Eyes: Negative for blurred vision, pain and discharge.  Respiratory: Negative for sputum production, shortness of breath, wheezing and stridor.   Cardiovascular: Negative for chest pain, palpitations, orthopnea and PND.  Gastrointestinal: Negative for abdominal pain, diarrhea, nausea and vomiting.  Genitourinary: Negative for frequency and urgency.  Musculoskeletal: Negative for back pain and joint pain.  Neurological: Positive for dizziness and weakness. Negative for sensory change, speech change and focal weakness.  Psychiatric/Behavioral: Negative for depression and hallucinations. The patient is not nervous/anxious.      MEDICATIONS AT HOME:   Prior to Admission medications   Medication Sig Start Date End Date Taking? Authorizing Provider  donepezil (ARICEPT) 5 MG tablet Take 5 mg by mouth at bedtime.    [provider]  lisinopril (PRINIVIL,ZESTRIL) 2.5 MG tablet TAKE ONE (1) TABLET EACH DAY 04/22/17   Einar Pheasant, MD  oseltamivir (TAMIFLU) 75 MG capsule Take 1 capsule (75 mg total) by mouth 2 (two) times daily for 5 days. 10/08/17 10/13/17  Darel Hong, MD      VITAL SIGNS:  Blood pressure (!) 141/81, pulse 76, temperature (!) 101.9 F (38.8 C), temperature source Oral, resp. rate 19, SpO2 95 %.  PHYSICAL EXAMINATION:  GENERAL:  84 y.o.-year-old patient lying in the bed  with no acute distress.  EYES: Pupils equal, round, reactive to light and accommodation. No scleral icterus. Extraocular muscles intact.  HEENT: Head atraumatic, normocephalic. Oropharynx and nasopharynx clear.  Appears clinically dehydrated NECK:  Supple, no jugular venous distention. No thyroid enlargement, no tenderness.  LUNGS: Normal breath sounds bilaterally, no wheezing, rales,rhonchi or crepitation. No use of accessory muscles of respiration.  CARDIOVASCULAR: S1, S2 normal. No murmurs, rubs, or  gallops.  ABDOMEN: Soft, nontender, nondistended. Bowel sounds present. No organomegaly or mass.  EXTREMITIES: No pedal edema, cyanosis, or clubbing.  NEUROLOGIC: Cranial nerves II through XII are intact. Muscle strength 5/5 in all extremities. Sensation intact. Gait not checked.  PSYCHIATRIC: The patient is alert and oriented x 3.  SKIN: No obvious rash, lesion, or ulcer.   LABORATORY PANEL:   CBC Recent Labs  Lab 10/08/17 1515  WBC 8.6  HGB 13.7  HCT 40.4  PLT 152   ------------------------------------------------------------------------------------------------------------------  Chemistries  Recent Labs  Lab 10/08/17 1515  NA 138  K 4.2  CL 105  CO2 24  GLUCOSE 111*  BUN 25*  CREATININE 0.81  CALCIUM 8.4*  AST 29  ALT 15*  ALKPHOS 60  BILITOT 0.4   ------------------------------------------------------------------------------------------------------------------  Cardiac Enzymes Recent Labs  Lab 10/08/17 1515  TROPONINI <0.03   ------------------------------------------------------------------------------------------------------------------  RADIOLOGY:  Ct Head Wo Contrast  Result Date: 10/08/2017 CLINICAL DATA:  Syncope with questionable fall EXAM: CT HEAD WITHOUT CONTRAST TECHNIQUE: Contiguous axial images were obtained from the base of the skull through the vertex without intravenous contrast. COMPARISON:  October 27, 2014 FINDINGS: Brain: Moderate diffuse atrophy is stable. There is no intracranial mass, hemorrhage, extra-axial fluid collection, or midline shift. There is patchy small vessel disease in the centra semiovale bilaterally. There is a tiny lacunar infarct in the left thalamus. No acute appearing infarct evident. Vascular: No hyperdense vessel evident. There is calcification in each carotid siphon region. Skull: Bony calvarium appears intact. Sinuses/Orbits: There is mucosal thickening and opacification involving multiple ethmoid air cells. Other  visualized paranasal sinuses are clear. Orbits appear symmetric bilaterally. Other: Mastoid air cells are clear. There is debris in each external auditory canal. IMPRESSION: 1. Atrophy with patchy periventricular small vessel disease. Tiny prior lacunar infarct left thalamus. No acute infarct evident. No mass or hemorrhage. 2.  Foci of arterial vascular calcification noted. 3.  Ethmoid sinus disease bilaterally. 4.  Probable cerumen in each external auditory canal. Electronically Signed   By: Lowella Grip III M.D.   On: 10/08/2017 15:50   Dg Chest Port 1 View  Result Date: 10/08/2017 CLINICAL DATA:  Fever and cough EXAM: PORTABLE CHEST 1 VIEW COMPARISON:  11/26/2016 FINDINGS: Normal heart size. Stable aortic and hilar contours with atherosclerotic calcification. Large lung volumes and chronic interstitial coarsening. No focal pneumonia. No effusion or pneumothorax. IMPRESSION: 1. No focal opacity to suggest pneumonia. 2. Large lung volumes, suspect COPD. Electronically Signed   By: Monte Fantasia M.D.   On: 10/08/2017 15:52    EKG:   Sinus rhythm no evidence of arrhythmia noted at present IMPRESSION AND PLAN:   Carlos Hayden  is a 84 y.o. male with a known history of hypertension comes to the emergency room with a syncopal episode that happened while patient was standing in line at the [facility] to get his ID renewed. She was brought to the emergency room was found to have found to have fever of 101.9.  He appears clinically dehydrated.  He received some fluids and then was made to  walk around by staff however had another near syncopal episode and patient now is being admitted for possible acute viral syndrome/near syncope with dehydration  1.  Acute viral syndrome with near syncopal/syncopal episode -Admit for overnight observation -IV fluids -Continue empiric Tamiflu -Tylenol PRN -Continue telemetry monitoring.  So far no arrhythmia noted -Denies any chest pain or shortness of breath  2.   Hypertension -I will hold off BP meds for now  3.  DVT prophylaxis subcu Lovenox    All the records are reviewed and case discussed with ED provider. Management plans discussed with the patient, family and they are in agreement.  CODE STATUS: full  TOTAL TIME TAKING CARE OF THIS PATIENT:  40 minutes.    Fritzi Mandes M.D on 10/08/2017 at 6:00 PM  Between 7am to 6pm - Pager - [phone]  After 6pm go to [email or web address] - password [email or web address] [facility]  [facility]  Office  [phone]  CC: Primary care physician; Einar Pheasant, MD

## 2017-10-08 NOTE — Discharge Instructions (Signed)
Please take all of your Tamiflu as prescribed for the next 5 days follow-up with your primary care physician in 2 days for recheck.  Return to the emergency department sooner for any concerns whatsoever.  It was a pleasure to take care of you today, and thank you for coming to our emergency department.  If you have any questions or concerns before leaving please ask the nurse to grab me and I'm more than happy to go through your aftercare instructions again.  If you were prescribed any opioid pain medication today such as Norco, Vicodin, Percocet, morphine, hydrocodone, or oxycodone please make sure you do not drive when you are taking this medication as it can alter your ability to drive safely.  If you have any concerns once you are home that you are not improving or are in fact getting worse before you can make it to your follow-up appointment, please do not hesitate to call 911 and come back for further evaluation.  Darel Hong, MD  Results for orders placed or performed during the hospital encounter of 10/08/17  Urinalysis, Complete w Microscopic  Result Value Ref Range   Color, Urine YELLOW (A) YELLOW   APPearance CLEAR (A) CLEAR   Specific Gravity, Urine 1.017 1.005 - 1.030   pH 5.0 5.0 - 8.0   Glucose, UA NEGATIVE NEGATIVE mg/dL   Hgb urine dipstick SMALL (A) NEGATIVE   Bilirubin Urine NEGATIVE NEGATIVE   Ketones, ur NEGATIVE NEGATIVE mg/dL   Protein, ur NEGATIVE NEGATIVE mg/dL   Nitrite NEGATIVE NEGATIVE   Leukocytes, UA MODERATE (A) NEGATIVE   RBC / HPF 0-5 0 - 5 RBC/hpf   WBC, UA 6-30 0 - 5 WBC/hpf   Bacteria, UA NONE SEEN NONE SEEN   Squamous Epithelial / LPF NONE SEEN NONE SEEN   Mucus PRESENT   Comprehensive metabolic panel  Result Value Ref Range   Sodium 138 135 - 145 mmol/L   Potassium 4.2 3.5 - 5.1 mmol/L   Chloride 105 101 - 111 mmol/L   CO2 24 22 - 32 mmol/L   Glucose, Bld 111 (H) 65 - 99 mg/dL   BUN 25 (H) 6 - 20 mg/dL   Creatinine, Ser 0.81 0.61 - 1.24  mg/dL   Calcium 8.4 (L) 8.9 - 10.3 mg/dL   Total Protein 6.4 (L) 6.5 - 8.1 g/dL   Albumin 3.8 3.5 - 5.0 g/dL   AST 29 15 - 41 U/L   ALT 15 (L) 17 - 63 U/L   Alkaline Phosphatase 60 38 - 126 U/L   Total Bilirubin 0.4 0.3 - 1.2 mg/dL   GFR calc non Af Amer >60 >60 mL/min   GFR calc Af Amer >60 >60 mL/min   Anion gap 9 5 - 15  Troponin I  Result Value Ref Range   Troponin I <0.03 <0.03 ng/mL  CBC with Differential  Result Value Ref Range   WBC 8.6 3.8 - 10.6 K/uL   RBC 4.14 (L) 4.40 - 5.90 MIL/uL   Hemoglobin 13.7 13.0 - 18.0 g/dL   HCT 40.4 40.0 - 52.0 %   MCV 97.6 80.0 - 100.0 fL   MCH 33.1 26.0 - 34.0 pg   MCHC 33.9 32.0 - 36.0 g/dL   RDW 13.3 11.5 - 14.5 %   Platelets 152 150 - 440 K/uL   Neutrophils Relative % 81 %   Neutro Abs 6.9 (H) 1.4 - 6.5 K/uL   Lymphocytes Relative 7 %   Lymphs Abs 0.6 (L) 1.0 -  3.6 K/uL   Monocytes Relative 11 %   Monocytes Absolute 1.0 0.2 - 1.0 K/uL   Eosinophils Relative 1 %   Eosinophils Absolute 0.1 0 - 0.7 K/uL   Basophils Relative 0 %   Basophils Absolute 0.0 0 - 0.1 K/uL  Glucose, capillary  Result Value Ref Range   Glucose-Capillary 126 (H) 65 - 99 mg/dL   Ct Head Wo Contrast  Result Date: 10/08/2017 CLINICAL DATA:  Syncope with questionable fall EXAM: CT HEAD WITHOUT CONTRAST TECHNIQUE: Contiguous axial images were obtained from the base of the skull through the vertex without intravenous contrast. COMPARISON:  October 27, 2014 FINDINGS: Brain: Moderate diffuse atrophy is stable. There is no intracranial mass, hemorrhage, extra-axial fluid collection, or midline shift. There is patchy small vessel disease in the centra semiovale bilaterally. There is a tiny lacunar infarct in the left thalamus. No acute appearing infarct evident. Vascular: No hyperdense vessel evident. There is calcification in each carotid siphon region. Skull: Bony calvarium appears intact. Sinuses/Orbits: There is mucosal thickening and opacification involving multiple  ethmoid air cells. Other visualized paranasal sinuses are clear. Orbits appear symmetric bilaterally. Other: Mastoid air cells are clear. There is debris in each external auditory canal. IMPRESSION: 1. Atrophy with patchy periventricular small vessel disease. Tiny prior lacunar infarct left thalamus. No acute infarct evident. No mass or hemorrhage. 2.  Foci of arterial vascular calcification noted. 3.  Ethmoid sinus disease bilaterally. 4.  Probable cerumen in each external auditory canal. Electronically Signed   By: Lowella Grip III M.D.   On: 10/08/2017 15:50   Dg Chest Port 1 View  Result Date: 10/08/2017 CLINICAL DATA:  Fever and cough EXAM: PORTABLE CHEST 1 VIEW COMPARISON:  11/26/2016 FINDINGS: Normal heart size. Stable aortic and hilar contours with atherosclerotic calcification. Large lung volumes and chronic interstitial coarsening. No focal pneumonia. No effusion or pneumothorax. IMPRESSION: 1. No focal opacity to suggest pneumonia. 2. Large lung volumes, suspect COPD. Electronically Signed   By: Monte Fantasia M.D.   On: 10/08/2017 15:52

## 2017-10-08 NOTE — Telephone Encounter (Signed)
Information has been printed

## 2017-10-08 NOTE — Telephone Encounter (Signed)
Please advise 

## 2017-10-08 NOTE — Telephone Encounter (Signed)
Called patient was unable to leave VM. Forms that him and his wife requested are waiting up front for pick up.  PEC may give information. 

## 2017-10-08 NOTE — ED Triage Notes (Signed)
PT to ED via EMS from [facility] office, pt was waiting in line and had syncopal episode. Per EMS pt was A&O upon arrival, pt was orthostatic per EMS. PT A&Ox4, speech clear. MD at bedside

## 2017-10-08 NOTE — ED Provider Notes (Signed)
[facility] Emergency Department Provider Note  ____________________________________________   First MD Initiated Contact with Patient 10/08/17 1512     (approximate)  I have reviewed the triage vital signs and the nursing notes.   HISTORY  Chief Complaint Near Syncope   HPI Carlos Hayden. is a 84 y.o. male who comes to the emergency department via EMS after a syncopal episode while standing in line at the [facility] today.  He said he was in his usual state of health when he was standing and he began to feel lightheaded nauseated and the next thing he knew he was on the ground.  He does say that he is felt some rhinorrhea and dry cough throughout the day today.  He did not get a flu shot this year.  His syncope was sudden onset.  His lightheadedness passed quickly.  He does have mild to moderate sharp upper chest pain worse with cough improved when not coughing.  Past Medical History:  Diagnosis Date  . Cancer ([place]) 11/28/10   Prostate  . Hypertension     Patient Active Problem List   Diagnosis Date Noted  . Thrombocytopenia ([place]) 07/07/2017  . Pancreatic mass 07/06/2017  . Aneurysm of infrarenal abdominal aorta ([facility]) 07/06/2017  . Memory change 03/15/2017  . Unsteady gait 07/11/2014  . Essential hypertension 05/25/2008  . Hypercholesterolemia 04/14/2008  . History of prostate cancer 04/14/2008  . VASOVAGAL SYNCOPE 08/05/2007    Past Surgical History:  Procedure Laterality Date  . TONSILLECTOMY  1930's  . TOTAL KNEE ARTHROPLASTY     right    Prior to Admission medications   Medication Sig Start Date End Date Taking? Authorizing Provider  donepezil (ARICEPT) 5 MG tablet Take 5 mg by mouth at bedtime.    [provider]  lisinopril (PRINIVIL,ZESTRIL) 2.5 MG tablet TAKE ONE (1) TABLET EACH DAY 04/22/17   Einar Pheasant, MD  oseltamivir (TAMIFLU) 75 MG capsule Take 1 capsule (75 mg total) by mouth 2 (two) times daily for 5 days.  10/08/17 10/13/17  Darel Hong, MD    Allergies Patient has no known allergies.  Family History  Problem Relation Age of Onset  . Prostate cancer Father   . Lung cancer Mother   . Cancer Daughter   . Stroke Daughter   . Heart attack Daughter     Social History Social History   Tobacco Use  . Smoking status: Former Research scientist (life sciences)  . Smokeless tobacco: Never Used  . Tobacco comment: QUIT SMOKING IN THE EARLY 50'S  Substance Use Topics  . Alcohol use: No    Alcohol/week: 0.0 oz  . Drug use: No    Review of Systems Constitutional: Positive for Eyes: No visual changes. ENT: No sore throat. Cardiovascular: Positive for chest pain. Respiratory: Positive for cough Gastrointestinal: No abdominal pain.  No nausea, no vomiting.  No diarrhea.  No constipation. Genitourinary: Negative for dysuria. Musculoskeletal: Negative for back pain. Skin: Negative for rash. Neurological: Negative for headaches, focal weakness or numbness.   ____________________________________________   PHYSICAL EXAM:  VITAL SIGNS: ED Triage Vitals  Enc Vitals Group     BP      Pulse      Resp      Temp      Temp src      SpO2      Weight      Height      Head Circumference      Peak Flow  Pain Score      Pain Loc      Pain Edu?      Excl. in [place]?     Constitutional: Alert and oriented x4 pleasant cooperative speaks in full clear sentences Eyes: PERRL EOMI. mid range and brisk Head: Abrasion to top of head. Nose: No congestion/rhinnorhea. Mouth/Throat: No trismus Neck: No stridor.   Cardiovascular: Tachycardic rate, regular rhythm. Grossly normal heart sounds.  Good peripheral circulation. Respiratory: Slightly increased respiratory effort.  No retractions. Lungs CTAB and moving good air Gastrointestinal: Soft nontender Musculoskeletal: No lower extremity edema   Neurologic:  Normal speech and language. No gross focal neurologic deficits are appreciated. Skin:  Skin is warm, dry and  intact. No rash noted. Psychiatric: Mood and affect are normal. Speech and behavior are normal.    ____________________________________________   DIFFERENTIAL includes but not limited to  Cardiogenic syncope, vasovagal syncope, dehydration, influenza, influenza-like illness, urinary tract infection ____________________________________________   LABS (all labs ordered are listed, but only abnormal results are displayed)  Labs Reviewed  URINALYSIS, COMPLETE (UACMP) WITH MICROSCOPIC - Abnormal; Notable for the following components:      Result Value   Color, Urine YELLOW (*)    APPearance CLEAR (*)    Hgb urine dipstick SMALL (*)    Leukocytes, UA MODERATE (*)    All other components within normal limits  COMPREHENSIVE METABOLIC PANEL - Abnormal; Notable for the following components:   Glucose, Bld 111 (*)    BUN 25 (*)    Calcium 8.4 (*)    Total Protein 6.4 (*)    ALT 15 (*)    All other components within normal limits  CBC WITH DIFFERENTIAL/PLATELET - Abnormal; Notable for the following components:   RBC 4.14 (*)    Neutro Abs 6.9 (*)    Lymphs Abs 0.6 (*)    All other components within normal limits  GLUCOSE, CAPILLARY - Abnormal; Notable for the following components:   Glucose-Capillary 126 (*)    All other components within normal limits  TROPONIN I  INFLUENZA PANEL BY PCR (TYPE A & B)    Lab work reviewed by me with no evidence of urinary tract infection __________________________________________  EKG  ED ECG REPORT I, Darel Hong, the attending physician, personally viewed and interpreted this ECG.  Date: 10/08/2017 EKG Time:  Rate: 86 Rhythm: normal sinus rhythm QRS Axis: normal Intervals: normal ST/T Wave abnormalities: normal Narrative Interpretation: no evidence of acute ischemia  ____________________________________________  RADIOLOGY  X-ray reviewed by me with no acute  disease ____________________________________________   PROCEDURES  Procedure(s) performed: no  Procedures  Critical Care performed: no  Observation: no ____________________________________________   INITIAL IMPRESSION / ASSESSMENT AND PLAN / ED COURSE  Pertinent labs & imaging results that were available during my care of the patient were reviewed by me and considered in my medical decision making (see chart for details).  On arrival the patient is febrile to 101.9 degrees and had a syncopal event.  He has no focal symptoms aside from dry cough and some rhinorrhea consistent with viral syndrome.  Chest x-ray and blood work are pending.  EKG is nonischemic no concerning signs for cardiogenic syncope.     ----------------------------------------- 5:29 PM on 10/08/2017 -----------------------------------------  After discharge the patient began to feel worse more lightheaded and tired.  Myself and the nurse attempted to get the patient up to ambulate and he had a near syncopal event once again.  We discussed with the patient's nursing home  who is not comfortable having him home with a viral syndrome and dehydration as well as fatigue which I think is reasonable.  At this point given the patient's deconditioned state and dehydration he requires inpatient admission for IV fluids, continue monitoring, and to see which way his viral syndrome goes.  I discussed with the patient and family who verbalized understanding and agreement with the plan.  I then discussed with the hospitalist who is graciously agreed to admit the patient to her service. ____________________________________________   FINAL CLINICAL IMPRESSION(S) / ED DIAGNOSES  Final diagnoses:  Influenza  Syncope and collapse  Dehydration      NEW MEDICATIONS STARTED DURING THIS VISIT:  This SmartLink is deprecated. Use AVSMEDLIST instead to display the medication list for a patient.   Note:  This document was prepared  using Dragon voice recognition software and may include unintentional dictation errors.     Darel Hong, MD 10/08/17 1746

## 2017-10-09 DIAGNOSIS — B349 Viral infection, unspecified: Secondary | ICD-10-CM | POA: Diagnosis not present

## 2017-10-09 DIAGNOSIS — R55 Syncope and collapse: Secondary | ICD-10-CM | POA: Diagnosis not present

## 2017-10-09 DIAGNOSIS — B348 Other viral infections of unspecified site: Secondary | ICD-10-CM | POA: Diagnosis not present

## 2017-10-09 LAB — RESPIRATORY PANEL BY PCR
ADENOVIRUS-RVPPCR: NOT DETECTED
Bordetella pertussis: NOT DETECTED
CORONAVIRUS 229E-RVPPCR: NOT DETECTED
CORONAVIRUS HKU1-RVPPCR: NOT DETECTED
CORONAVIRUS NL63-RVPPCR: NOT DETECTED
CORONAVIRUS OC43-RVPPCR: NOT DETECTED
Chlamydophila pneumoniae: NOT DETECTED
Influenza A: NOT DETECTED
Influenza B: NOT DETECTED
METAPNEUMOVIRUS-RVPPCR: NOT DETECTED
MYCOPLASMA PNEUMONIAE-RVPPCR: NOT DETECTED
PARAINFLUENZA VIRUS 1-RVPPCR: NOT DETECTED
PARAINFLUENZA VIRUS 2-RVPPCR: NOT DETECTED
Parainfluenza Virus 3: NOT DETECTED
Parainfluenza Virus 4: NOT DETECTED
Respiratory Syncytial Virus: NOT DETECTED
Rhinovirus / Enterovirus: DETECTED — AB

## 2017-10-09 MED ORDER — SODIUM CHLORIDE 0.9 % IV SOLN
INTRAVENOUS | Status: DC
  Administered 2017-10-09 – 2017-10-10 (×2): via INTRAVENOUS

## 2017-10-09 NOTE — Consult Note (Signed)
Patient not of mental state per RN to be able to complete HCPOA

## 2017-10-09 NOTE — Progress Notes (Signed)
[facility] at [place] NAME: Carlos Hayden    MR#:  [id number]  DATE OF BIRTH:  1925/11/18  SUBJECTIVE:  Patient appears very sleepy awakens tells me his name and last name.  He is very confused. Had some low-grade fever this morning.  No family in the room.  REVIEW OF SYSTEMS:   Review of Systems  Unable to perform ROS: Mental status change   Tolerating Diet: Tolerating PT:   DRUG ALLERGIES:  No Known Allergies  VITALS:  Blood pressure 118/67, pulse 75, temperature 99.1 F (37.3 C), temperature source Oral, resp. rate 18, height 6\' 1"  (1.854 m), weight 76.7 kg (169 lb 3.2 oz), SpO2 (!) 89 %.  PHYSICAL EXAMINATION:   Physical Exam  GENERAL:  84 y.o.-year-old patient lying in the bed with no acute distress.  EYES: Pupils equal, round, reactive to light and accommodation. No scleral icterus. Extraocular muscles intact.  HEENT: Head atraumatic, normocephalic. Oropharynx and nasopharynx clear.  NECK:  Supple, no jugular venous distention. No thyroid enlargement, no tenderness.  LUNGS: Normal breath sounds bilaterally, no wheezing, rales, rhonchi. No use of accessory muscles of respiration.  CARDIOVASCULAR: S1, S2 normal. No murmurs, rubs, or gallops.  ABDOMEN: Soft, nontender, nondistended. Bowel sounds present. No organomegaly or mass.  EXTREMITIES: No cyanosis, clubbing or edema b/l.    NEUROLOGIC:limitied exam No focal Motor or sensory deficits b/l.   PSYCHIATRIC:  patient is alert but confused SKIN: No obvious rash, lesion, or ulcer.   LABORATORY PANEL:  CBC Recent Labs  Lab 10/08/17 1515  WBC 8.6  HGB 13.7  HCT 40.4  PLT 152    Chemistries  Recent Labs  Lab 10/08/17 1515  NA 138  K 4.2  CL 105  CO2 24  GLUCOSE 111*  BUN 25*  CREATININE 0.81  CALCIUM 8.4*  AST 29  ALT 15*  ALKPHOS 60  BILITOT 0.4   Cardiac Enzymes Recent Labs  Lab 10/08/17 1515  TROPONINI <0.03   RADIOLOGY:  Ct Head Wo  Contrast  Result Date: 10/08/2017 CLINICAL DATA:  Syncope with questionable fall EXAM: CT HEAD WITHOUT CONTRAST TECHNIQUE: Contiguous axial images were obtained from the base of the skull through the vertex without intravenous contrast. COMPARISON:  October 27, 2014 FINDINGS: Brain: Moderate diffuse atrophy is stable. There is no intracranial mass, hemorrhage, extra-axial fluid collection, or midline shift. There is patchy small vessel disease in the centra semiovale bilaterally. There is a tiny lacunar infarct in the left thalamus. No acute appearing infarct evident. Vascular: No hyperdense vessel evident. There is calcification in each carotid siphon region. Skull: Bony calvarium appears intact. Sinuses/Orbits: There is mucosal thickening and opacification involving multiple ethmoid air cells. Other visualized paranasal sinuses are clear. Orbits appear symmetric bilaterally. Other: Mastoid air cells are clear. There is debris in each external auditory canal. IMPRESSION: 1. Atrophy with patchy periventricular small vessel disease. Tiny prior lacunar infarct left thalamus. No acute infarct evident. No mass or hemorrhage. 2.  Foci of arterial vascular calcification noted. 3.  Ethmoid sinus disease bilaterally. 4.  Probable cerumen in each external auditory canal. Electronically Signed   By: Lowella Grip III M.D.   On: 10/08/2017 15:50   Dg Chest Port 1 View  Result Date: 10/08/2017 CLINICAL DATA:  Fever and cough EXAM: PORTABLE CHEST 1 VIEW COMPARISON:  11/26/2016 FINDINGS: Normal heart size. Stable aortic and hilar contours with atherosclerotic calcification. Large lung volumes and chronic interstitial coarsening. No focal pneumonia. No effusion  or pneumothorax. IMPRESSION: 1. No focal opacity to suggest pneumonia. 2. Large lung volumes, suspect COPD. Electronically Signed   By: Monte Fantasia M.D.   On: 10/08/2017 15:52   ASSESSMENT AND PLAN:  Carlos Hayden  is a 84 y.o. male with a known history of  hypertension comes to the emergency room with a syncopal episode that happened while patient was standing in line at the [facility] to get his ID renewed. She was brought to the emergency room was found to have found to have fever of 101.9.  He appears clinically dehydrated.  He received some fluids and then was made to walk around by staff however had another near syncopal episode and patient now is being admitted for possible acute viral syndrome/near syncope with dehydration  1.  Acute viral syndrome with near syncopal/syncopal episode -IV fluids -Continue empiric Tamiflu -Tylenol PRN -Continue telemetry monitoring.  So far no arrhythmia noted--d/c tele -Denies any chest pain or shortness of breath Patient confused this morning.  He has a history of dementia on Aricept.  We will continue that.  CT head negative for any stroke.  If his confusion does not improve will consider MRI of the brain  2.  Hypertension -I will hold off BP meds for now  3.  DVT prophylaxis subcu Lovenox    Case discussed with Care Management/Social Worker. Management plans discussed with the patient, family and they are in agreement.  CODE STATUS: full  DVT Prophylaxis: Lovenox  TOTAL TIME TAKING CARE OF THIS PATIENT: 25 minutes.  >50% time spent on counselling and coordination of care  POSSIBLE D/C IN 1-2 DAYS, DEPENDING ON CLINICAL CONDITION.  Note: This dictation was prepared with Dragon dictation along with smaller phrase technology. Any transcriptional errors that result from this process are unintentional.  Fritzi Mandes M.D on 10/09/2017 at 11:30 AM  Between 7am to 6pm - Pager - [phone]  After 6pm go to [email or web address] - Proofreader  [facility]  Office  [phone]  CC: Primary care physician; Carlos Hayden, MDPatient ID: Carlos Clark., male   DOB: 25-Feb-1926, 84 y.o.   MRN: [id number]

## 2017-10-09 NOTE — Care Management Obs Status (Signed)
[place] NOTIFICATION   Patient Details  Name: Carlos Hayden. MRN: [id number] Date of Birth: 01-Nov-1925   Medicare Observation Status Notification Given:  Yes Daughter Janann August, RN 10/09/2017, 12:01 PM

## 2017-10-09 NOTE — Plan of Care (Signed)
Pt oriented to person, forgetful. Impulsive through the night with urinary urgency and frequency. Increased anxiety, shaky and unsteady. Pt had Tramadol for pain and was able to go to sleep. Pt is from assisted living. Goals of therapy not met due to pt baseline dementia and unsteadiness with mobility. No falls this shift but pt continued to hop out of bed often. Pt afebrile throughout the night. Continue droplet precautions due to the fact that was febrile prior to admission. MRSA pcr negative was negative.  Spiritual Needs Ability to function at adequate level 10/09/2017 0531 - Not Met (add Reason) by Jeffie Pollock, RN   Education: Knowledge of General Education information will improve 10/09/2017 [phone] - Not Met (add Reason) by Jeffie Pollock, RN   Clinical Measurements: Will remain free from infection 10/09/2017 0531 - Progressing by Jeffie Pollock, RN   Activity: Risk for activity intolerance will decrease 10/09/2017 0531 - Not Met (add Reason) by Jeffie Pollock, RN   Nutrition: Adequate nutrition will be maintained 10/09/2017 0531 - Progressing by Jeffie Pollock, RN   Coping: Level of anxiety will decrease 10/09/2017 0531 - Progressing by Jeffie Pollock, RN   Elimination: Will not experience complications related to bowel motility 10/09/2017 0531 - Progressing by Jeffie Pollock, RN Will not experience complications related to urinary retention 10/09/2017 0531 - Progressing by Jeffie Pollock, RN   Safety: Ability to remain free from injury will improve 10/09/2017 0531 - Not Met (add Reason) by Jeffie Pollock, RN

## 2017-10-09 NOTE — Care Management Note (Signed)
Case Management Note  Patient Details  Name: Carlos Hayden. MRN: [id number] Date of Birth: 1926-09-08  Subjective/Objective:   Admitted to [facility] under observation status with the diagnosis of acute viral syndrome. Wife is Lorre Nick [phone]). Guardian is daughter  Louanne Belton Tharon Aquas). [phone]. Wife and Mr. Walz have lived at [facility] since October 9th 2018. Last seen Dr. Einar Pheasant 07/03/17.  Daughter states that her father was walking without the use of any aids prior to this episode. He was independent with his daily activities.                  Action/Plan: Our medical records indicate that Mr. Ochs's guardian was Irven Shelling at [employer]  Daughter states she obtained guardianship December 18th 2018. Will bring in papers to be scanned into the chart.    Expected Discharge Date:                  Expected Discharge Plan:     In-House Referral:     Discharge planning Services     Post Acute Care Choice:    Choice offered to:     DME Arranged:    DME Agency:     HH Arranged:    HH Agency:     Status of Service:     If discussed at [employer], dates discussed:    Additional Comments:  Shelbie Ammons, RN MSN CCM Care Management [phone] 10/09/2017, 12:02 PM

## 2017-10-10 ENCOUNTER — Telehealth: Payer: Self-pay | Admitting: Internal Medicine

## 2017-10-10 DIAGNOSIS — B349 Viral infection, unspecified: Secondary | ICD-10-CM | POA: Diagnosis not present

## 2017-10-10 DIAGNOSIS — B348 Other viral infections of unspecified site: Secondary | ICD-10-CM | POA: Diagnosis not present

## 2017-10-10 DIAGNOSIS — R55 Syncope and collapse: Secondary | ICD-10-CM | POA: Diagnosis not present

## 2017-10-10 NOTE — Telephone Encounter (Signed)
Copied from [place] [id number]. Topic: Appointment Scheduling - Scheduling Inquiry for Clinic >> Oct 10, 2017 10:48 AM Lolita Rieger, RMA wrote: Reason for CRM: Pt being discharged form hospital today and needs an appointment for follow up within the next 2 weeks but Dr. Nicki Reaper has no av alibility Can you please check her schedule and call pt to schedule appointment

## 2017-10-10 NOTE — Progress Notes (Signed)
[place] [facility] [place] NAME: Carlos Hayden    MR#:  [id number]  DATE OF BIRTH:  1926/02/06  SUBJECTIVE:  Awake and alert. Ordering breakfast Weak No fever spikes  REVIEW OF SYSTEMS:   Review of Systems  Constitutional: Negative for chills, fever and weight loss.  HENT: Negative for ear discharge, ear pain and nosebleeds.   Eyes: Negative for blurred vision, pain and discharge.  Respiratory: Negative for sputum production, shortness of breath, wheezing and stridor.   Cardiovascular: Negative for chest pain, palpitations, orthopnea and PND.  Gastrointestinal: Negative for abdominal pain, diarrhea, nausea and vomiting.  Genitourinary: Negative for frequency and urgency.  Musculoskeletal: Negative for back pain and joint pain.  Neurological: Positive for weakness. Negative for sensory change, speech change and focal weakness.  Psychiatric/Behavioral: Negative for depression and hallucinations. The patient is not nervous/anxious.    Tolerating Diet:yes Tolerating PT: pending  DRUG ALLERGIES:  No Known Allergies  VITALS:  Blood pressure (!) 109/42, pulse (!) 53, temperature 98.2 F (36.8 C), temperature source Oral, resp. rate 18, height 6\' 1"  (1.854 m), weight 76.7 kg (169 lb 3.2 oz), SpO2 90 %.  PHYSICAL EXAMINATION:   Physical Exam  GENERAL:  84 y.o.-year-old patient lying in the bed with no acute distress.  EYES: Pupils equal, round, reactive to light and accommodation. No scleral icterus. Extraocular muscles intact.  HEENT: Head atraumatic, normocephalic. Oropharynx and nasopharynx clear.  NECK:  Supple, no jugular venous distention. No thyroid enlargement, no tenderness.  LUNGS: Normal breath sounds bilaterally, no wheezing, rales, rhonchi. No use of accessory muscles of respiration.  CARDIOVASCULAR: S1, S2 normal. No murmurs, rubs, or gallops.  ABDOMEN: Soft, nontender, nondistended. Bowel sounds present. No organomegaly  or mass.  EXTREMITIES: No cyanosis, clubbing or edema b/l.    NEUROLOGIC:limitied exam No focal Motor or sensory deficits b/l.   PSYCHIATRIC:  patient is alert and awake SKIN: No obvious rash, lesion, or ulcer.   LABORATORY PANEL:  CBC Recent Labs  Lab 10/08/17 1515  WBC 8.6  HGB 13.7  HCT 40.4  PLT 152    Chemistries  Recent Labs  Lab 10/08/17 1515  NA 138  K 4.2  CL 105  CO2 24  GLUCOSE 111*  BUN 25*  CREATININE 0.81  CALCIUM 8.4*  AST 29  ALT 15*  ALKPHOS 60  BILITOT 0.4   Cardiac Enzymes Recent Labs  Lab 10/08/17 1515  TROPONINI <0.03   RADIOLOGY:  Ct Head Wo Contrast  Result Date: 10/08/2017 CLINICAL DATA:  Syncope with questionable fall EXAM: CT HEAD WITHOUT CONTRAST TECHNIQUE: Contiguous axial images were obtained from the base of the skull through the vertex without intravenous contrast. COMPARISON:  October 27, 2014 FINDINGS: Brain: Moderate diffuse atrophy is stable. There is no intracranial mass, hemorrhage, extra-axial fluid collection, or midline shift. There is patchy small vessel disease in the centra semiovale bilaterally. There is a tiny lacunar infarct in the left thalamus. No acute appearing infarct evident. Vascular: No hyperdense vessel evident. There is calcification in each carotid siphon region. Skull: Bony calvarium appears intact. Sinuses/Orbits: There is mucosal thickening and opacification involving multiple ethmoid air cells. Other visualized paranasal sinuses are clear. Orbits appear symmetric bilaterally. Other: Mastoid air cells are clear. There is debris in each external auditory canal. IMPRESSION: 1. Atrophy with patchy periventricular small vessel disease. Tiny prior lacunar infarct left thalamus. No acute infarct evident. No mass or hemorrhage. 2.  Foci of arterial vascular calcification noted. 3.  Ethmoid sinus disease bilaterally. 4.  Probable cerumen in each external auditory canal. Electronically Signed   By: Lowella Grip III M.D.    On: 10/08/2017 15:50   Dg Chest Port 1 View  Result Date: 10/08/2017 CLINICAL DATA:  Fever and cough EXAM: PORTABLE CHEST 1 VIEW COMPARISON:  11/26/2016 FINDINGS: Normal heart size. Stable aortic and hilar contours with atherosclerotic calcification. Large lung volumes and chronic interstitial coarsening. No focal pneumonia. No effusion or pneumothorax. IMPRESSION: 1. No focal opacity to suggest pneumonia. 2. Large lung volumes, suspect COPD. Electronically Signed   By: Monte Fantasia M.D.   On: 10/08/2017 15:52   ASSESSMENT AND PLAN:  Carlos Hayden  is a 84 y.o. male with a known history of hypertension comes to the emergency room with a syncopal episode that happened while patient was standing in line at the [facility] to get his ID renewed. She was brought to the emergency room was found to have found to have fever of 101.9.  He appears clinically dehydrated.  He received some fluids and then was made to walk around by staff however had another near syncopal episode and patient now is being admitted for possible acute viral syndrome/near syncope with dehydration  1.  Acute viral syndrome with near syncopal/syncopal episode -received IV fluids -Tylenol PRN -Denies any chest pain or shortness of breath -Resp PCR positive for Rhinovrus -symptomatic management  2.  Hypertension -I will hold off BP meds for now  3.  DVT prophylaxis subcu Lovenox  PT to evaluate D/c planning today after PT eval Left msg for dter wesla [phone]  Case discussed with Care Management/Social Worker. Management plans discussed with the patient, family and they are in agreement.  CODE STATUS: full  DVT Prophylaxis: Lovenox  TOTAL TIME TAKING CARE OF THIS PATIENT: 25 minutes.  >50% time spent on counselling and coordination of care  POSSIBLE D/C IN 1 DAY, DEPENDING ON CLINICAL CONDITION.  Note: This dictation was prepared with Dragon dictation along with smaller phrase technology. Any transcriptional  errors that result from this process are unintentional.  Fritzi Mandes M.D on 10/10/2017 at 7:39 AM  Between 7am to 6pm - Pager - [phone]  After 6pm go to [email or web address] - Proofreader  [facility]  Office  [phone]  CC: Primary care physician; Einar Pheasant, MDPatient ID: Carlos Clark., male   DOB: 1926-04-09, 84 y.o.   MRN: [id number]

## 2017-10-10 NOTE — Evaluation (Signed)
Physical Therapy Evaluation Patient Details Name: Carlos Hayden. MRN: [id number] DOB: 06-02-1926 Today's Date: 10/10/2017   History of Present Illness  84 y.o. male with a known history of hypertension comes to the emergency room with a syncopal episode.  Clinical Impression  Pt did well with bed mobility and though he needed some extra effort rising to standing he did not need assist.  Overall he was safe with ambulation with and w/o AD, but did have a little stagger stepping and needed light cuing/gait training assist.  Overall he should be safe to return to his ALF and would benefit from HHPT to address the few functional limitations that he does have.     Follow Up Recommendations Home health PT    Equipment Recommendations  None recommended by PT    Recommendations for Other Services       Precautions / Restrictions Precautions Precautions: Fall Restrictions Weight Bearing Restrictions: No      Mobility  Bed Mobility Overal bed mobility: Independent             General bed mobility comments: Pt easily able to get himself to sitting EOB  Transfers Overall transfer level: Independent Equipment used: None             General transfer comment: Pt is able to rise to standing w/o assist, showed good balance once standing but did need 2-3 boots with his UEs to get to upright from low bed setting  Ambulation/Gait Ambulation/Gait assistance: Min guard Ambulation Distance (Feet): 200 Feet Assistive device: None;Rolling walker (2 wheeled)       General Gait Details: ~100 ft with walker and ~100 ft w/o AD, pt confident though he did have a few minor stagger steps that he was easily able to self arrest.  Overall pt did need some cuing/gait training for safety and general awareness, but did not have any overt safety issues.    Stairs            Wheelchair Mobility    Modified Rankin (Stroke Patients Only)       Balance Overall balance  assessment: Modified Independent                                           Pertinent Vitals/Pain Pain Assessment: No/denies pain    Home Living Family/patient expects to be discharged to:: Assisted living                      Prior Function Level of Independence: Independent         Comments: Pt states he walks around at his ALF w/o issue     Hand Dominance        Extremity/Trunk Assessment   Upper Extremity Assessment Upper Extremity Assessment: Overall WFL for tasks assessed;Generalized weakness    Lower Extremity Assessment Lower Extremity Assessment: Overall WFL for tasks assessed;Generalized weakness       Communication   Communication: No difficulties  Cognition Arousal/Alertness: Awake/alert Behavior During Therapy: Impulsive Overall Cognitive Status: History of cognitive impairments - at baseline                                        General Comments      Exercises     Assessment/Plan  PT Assessment Patient needs continued PT services  PT Problem List Decreased strength;Decreased range of motion;Decreased activity tolerance;Decreased balance;Decreased mobility;Decreased coordination;Decreased cognition;Decreased knowledge of use of DME;Decreased safety awareness       PT Treatment Interventions Gait training;Functional mobility training;Therapeutic activities;Therapeutic exercise;Balance training;Neuromuscular re-education;Cognitive remediation;Patient/family education    PT Goals (Current goals can be found in the Care Plan section)  Acute Rehab PT Goals Patient Stated Goal: go back to ALF PT Goal Formulation: With patient Time For Goal Achievement: 10/24/17 Potential to Achieve Goals: Good    Frequency Min 2X/week   Barriers to discharge        Co-evaluation               AM-PAC PT "6 Clicks" Daily Activity  Outcome Measure Difficulty turning over in bed (including adjusting  bedclothes, sheets and blankets)?: None Difficulty moving from lying on back to sitting on the side of the bed? : None Difficulty sitting down on and standing up from a chair with arms (e.g., wheelchair, bedside commode, etc,.)?: A Little Help needed moving to and from a bed to chair (including a wheelchair)?: None Help needed walking in hospital room?: None Help needed climbing 3-5 steps with a railing? : A Little 6 Click Score: 22    End of Session Equipment Utilized During Treatment: Gait belt Activity Tolerance: Patient tolerated treatment well Patient left: with chair alarm set;with call bell/phone within reach Nurse Communication: Mobility status PT Visit Diagnosis: Unsteadiness on feet (R26.81);Muscle weakness (generalized) (M62.81);Difficulty in walking, not elsewhere classified (R26.2)    Time: 3244-0102 PT Time Calculation (min) (ACUTE ONLY): 26 min   Charges:   PT Evaluation $PT Eval Low Complexity: 1 Low PT Treatments $Gait Training: 8-22 mins   PT G Codes:        Kreg Shropshire, DPT 10/10/2017, 10:30 AM

## 2017-10-10 NOTE — NC FL2 (Signed)
[place] LEVEL OF CARE SCREENING TOOL     IDENTIFICATION  Patient Name: Carlos Hayden. Birthdate: [birth year] Sex: male Admission Date (Current Location): 10/08/2017  [place] and [place] Number:  Engineering geologist and Address:  [facility], [address], [place], [place] [address]      Provider Number: [id number]  Attending Physician Name and Address:  Fritzi Mandes, MD  Relative Name and Phone Number:     Tegan, [place] [phone]    [facility] Daughter   [phone]        Current Level of Care: Hospital Recommended Level of Care: Assisted Living Facility([facility]) Prior Approval Number:    Date Approved/Denied:   PASRR Number:    Discharge Plan: Domiciliary (Rest home)([facility])    Current Diagnoses: Patient Active Problem List   Diagnosis Date Noted  . Acute viral syndrome 10/08/2017  . Thrombocytopenia ([place]) 07/07/2017  . Pancreatic mass 07/06/2017  . Aneurysm of infrarenal abdominal aorta (HCC) 07/06/2017  . Memory change 03/15/2017  . Unsteady gait 07/11/2014  . Essential hypertension 05/25/2008  . Hypercholesterolemia 04/14/2008  . History of prostate cancer 04/14/2008  . VASOVAGAL SYNCOPE 08/05/2007    Orientation RESPIRATION BLADDER Height & Weight     Self  Normal Continent Weight: 169 lb 3.2 oz (76.7 kg) Height:  6\' 1"  (185.4 cm)  BEHAVIORAL SYMPTOMS/MOOD NEUROLOGICAL BOWEL NUTRITION STATUS      Continent Diet(Regular diet)  AMBULATORY STATUS COMMUNICATION OF NEEDS Skin   Independent Verbally Normal                       Personal Care Assistance Level of Assistance  Bathing, Feeding, Dressing Bathing Assistance: Limited assistance Feeding assistance: Limited assistance Dressing Assistance: Limited assistance     Functional Limitations Info  Sight, Hearing, Speech Sight Info: Adequate Hearing Info: Adequate Speech Info: Adequate     SPECIAL CARE FACTORS FREQUENCY  PT (By licensed PT)     PT Frequency: Minimum 2x a week home health              Contractures      Additional Factors Info  Code Status, Allergies Code Status Info: Full Code Allergies Info: nka           Current Medications (10/10/2017):  This is the current hospital active medication list Current Facility-Administered Medications  Medication Dose Route Frequency Provider Last Rate Last Dose  . acetaminophen (TYLENOL) tablet 650 mg  650 mg Oral Q6H PRN Fritzi Mandes, MD       Or  . acetaminophen (TYLENOL) suppository 650 mg  650 mg Rectal Q6H PRN Fritzi Mandes, MD      . donepezil (ARICEPT) tablet 5 mg  5 mg Oral QHS Fritzi Mandes, MD   5 mg at 10/09/17 2254  . enoxaparin (LOVENOX) injection 40 mg  40 mg Subcutaneous Q24H Fritzi Mandes, MD   40 mg at 10/09/17 2254  . Influenza vac split quadrivalent PF (FLUZONE HIGH-DOSE) injection 0.5 mL  0.5 mL Intramuscular Tomorrow-1000 Fritzi Mandes, MD      . ondansetron [facility]) tablet 4 mg  4 mg Oral Q6H PRN Fritzi Mandes, MD       Or  . ondansetron [facility]) injection 4 mg  4 mg Intravenous Q6H PRN Fritzi Mandes, MD      . pneumococcal 23 valent vaccine (PNU-IMMUNE) injection 0.5 mL  0.5 mL Intramuscular Tomorrow-1000 Fritzi Mandes, MD      .  polyethylene glycol (MIRALAX / GLYCOLAX) packet 17 g  17 g Oral Daily PRN Fritzi Mandes, MD   17 g at 10/10/17 1010  . traMADol (ULTRAM) tablet 50 mg  50 mg Oral Q6H PRN Fritzi Mandes, MD   50 mg at 10/09/17 2254     Discharge Medications: Please see discharge summary for a list of discharge medications.  Relevant Imaging Results:  Relevant Lab Results:   Additional Information SSN [id number]  Ross Ludwig, [place]

## 2017-10-10 NOTE — Telephone Encounter (Signed)
I am not sure if this needs to be a TCM call.  If so, Juliann Pulse is not here and I was not sure who to send info to.  Please call and confirm doing ok and then message to [facility] for work in appt.

## 2017-10-10 NOTE — Progress Notes (Signed)
Per infection control patient does not need to be on enteric or contact precautions discontinued per Dr Posey Pronto

## 2017-10-10 NOTE — Progress Notes (Signed)
Patient with daughter to [facility], daughter has discharge package and will give to [facility] on arrival

## 2017-10-10 NOTE — Care Management (Signed)
Physical therapy evaluation completed. Recommending home with home health and physical therapy. [facility] own [facility] at [facility] building. Discharge to [facility] per Dr. Fritzi Mandes Daughter will be updated. Shelbie Ammons RN MSN CCM Care Management [phone]

## 2017-10-10 NOTE — Telephone Encounter (Signed)
Thank you :)

## 2017-10-10 NOTE — Telephone Encounter (Signed)
Please advise not sure how to handle  If I need to schedule and where

## 2017-10-10 NOTE — Discharge Summary (Addendum)
[place] [facility] [place] NAME: Carlos Hayden    MR#:  [id number]  DATE OF BIRTH:  Mar 11, 1926  DATE OF ADMISSION:  10/08/2017 ADMITTING PHYSICIAN: Carlos Mandes, MD  DATE OF DISCHARGE: 10/10/2017  PRIMARY CARE PHYSICIAN: Carlos Pheasant, MD    ADMISSION DIAGNOSIS:  Syncope and collapse [R55] Dehydration [E86.0] Influenza [J11.1]  DISCHARGE DIAGNOSIS:  Syncope due to dehydration Acute viral illness-- Rhinovirus History of dementia SECONDARY DIAGNOSIS:   Past Medical History:  Diagnosis Date  . Cancer ([place]) 11/28/10   Prostate  . Hypertension     HOSPITAL COURSE:  WilliamShoffneris a89 y.o.malewith a known history of hypertension comes to the emergency room with a syncopal episode that happened while patient was standing in line at the [facility] to get his ID renewed. She was brought to the emergency room was found to have found to have fever of 101.9. He appears clinically dehydrated.   1. Acute viral syndrome with near syncopal/syncopal episode -received IV fluids -Tylenol PRN -Denies any chest pain or shortness of breath -Resp PCR positive for Rhinovrus -symptomatic management  2. Hypertension -stable  3. DVT prophylaxis subcu Lovenox  4.  History of dementia.  Patient is on Aricept.  Patient ambulated well with physical therapy.  He has some issues with balance.  Physical therapy recommends home health PT which will be arranged.  [facility].  CONSULTS OBTAINED:    DRUG ALLERGIES:  No Known Allergies  DISCHARGE MEDICATIONS:   Allergies as of 10/10/2017   No Known Allergies     Medication List    TAKE these medications   donepezil 5 MG tablet Commonly known as:  ARICEPT Take 5 mg by mouth at bedtime.   lisinopril 2.5 MG tablet Commonly known as:  PRINIVIL,ZESTRIL TAKE ONE (1) TABLET EACH DAY       If you experience worsening of your admission  symptoms, develop shortness of breath, life threatening emergency, suicidal or homicidal thoughts you must seek medical attention immediately by calling 911 or calling your MD immediately  if symptoms less severe.  You Must read complete instructions/literature along with all the possible adverse reactions/side effects for all the Medicines you take and that have been prescribed to you. Take any new Medicines after you have completely understood and accept all the possible adverse reactions/side effects.   Please note  You were cared for by a hospitalist during your hospital stay. If you have any questions about your discharge medications or the care you received while you were in the hospital after you are discharged, you can call the unit and asked to speak with the hospitalist on call if the hospitalist that took care of you is not available. Once you are discharged, your primary care physician will handle any further medical issues. Please note that NO REFILLS for any discharge medications will be authorized once you are discharged, as it is imperative that you return to your primary care physician (or establish a relationship with a primary care physician if you do not have one) for your aftercare needs so that they can reassess your need for medications and monitor your lab values.  DATA REVIEW:   CBC  Recent Labs  Lab 10/08/17 1515  WBC 8.6  HGB 13.7  HCT 40.4  PLT 152    Chemistries  Recent Labs  Lab 10/08/17 1515  NA 138  K 4.2  CL 105  CO2 24  GLUCOSE  111*  BUN 25*  CREATININE 0.81  CALCIUM 8.4*  AST 29  ALT 15*  ALKPHOS 60  BILITOT 0.4    Microbiology Results   Recent Results (from the past 240 hour(s))  MRSA PCR Screening     Status: None   Collection Time: 10/08/17 10:19 PM  Result Value Ref Range Status   MRSA by PCR NEGATIVE NEGATIVE Final    Comment:        The GeneXpert MRSA Assay (FDA approved for NASAL specimens only), is one component of  a comprehensive MRSA colonization surveillance program. It is not intended to diagnose MRSA infection nor to guide or monitor treatment for MRSA infections. Performed at [facility], [address] [place], [place] [address]   Respiratory Panel by PCR     Status: Abnormal   Collection Time: 10/09/17  9:07 AM  Result Value Ref Range Status   Adenovirus NOT DETECTED NOT DETECTED Final   Coronavirus 229E NOT DETECTED NOT DETECTED Final   Coronavirus HKU1 NOT DETECTED NOT DETECTED Final   Coronavirus NL63 NOT DETECTED NOT DETECTED Final   Coronavirus OC43 NOT DETECTED NOT DETECTED Final   Metapneumovirus NOT DETECTED NOT DETECTED Final   Rhinovirus / Enterovirus DETECTED (A) NOT DETECTED Final   Influenza A NOT DETECTED NOT DETECTED Final   Influenza B NOT DETECTED NOT DETECTED Final   Parainfluenza Virus 1 NOT DETECTED NOT DETECTED Final   Parainfluenza Virus 2 NOT DETECTED NOT DETECTED Final   Parainfluenza Virus 3 NOT DETECTED NOT DETECTED Final   Parainfluenza Virus 4 NOT DETECTED NOT DETECTED Final   Respiratory Syncytial Virus NOT DETECTED NOT DETECTED Final   Bordetella pertussis NOT DETECTED NOT DETECTED Final   Chlamydophila pneumoniae NOT DETECTED NOT DETECTED Final   Mycoplasma pneumoniae NOT DETECTED NOT DETECTED Final    Comment: Performed at [facility], [place] [address], [place], [place] [address]    RADIOLOGY:  Ct Head Wo Contrast  Result Date: 10/08/2017 CLINICAL DATA:  Syncope with questionable fall EXAM: CT HEAD WITHOUT CONTRAST TECHNIQUE: Contiguous axial images were obtained from the base of the skull through the vertex without intravenous contrast. COMPARISON:  October 27, 2014 FINDINGS: Brain: Moderate diffuse atrophy is stable. There is no intracranial mass, hemorrhage, extra-axial fluid collection, or midline shift. There is patchy small vessel disease in the centra semiovale bilaterally. There is a tiny lacunar infarct in the left thalamus. No  acute appearing infarct evident. Vascular: No hyperdense vessel evident. There is calcification in each carotid siphon region. Skull: Bony calvarium appears intact. Sinuses/Orbits: There is mucosal thickening and opacification involving multiple ethmoid air cells. Other visualized paranasal sinuses are clear. Orbits appear symmetric bilaterally. Other: Mastoid air cells are clear. There is debris in each external auditory canal. IMPRESSION: 1. Atrophy with patchy periventricular small vessel disease. Tiny prior lacunar infarct left thalamus. No acute infarct evident. No mass or hemorrhage. 2.  Foci of arterial vascular calcification noted. 3.  Ethmoid sinus disease bilaterally. 4.  Probable cerumen in each external auditory canal. Electronically Signed   By: Lowella Grip III M.D.   On: 10/08/2017 15:50   Dg Chest Port 1 View  Result Date: 10/08/2017 CLINICAL DATA:  Fever and cough EXAM: PORTABLE CHEST 1 VIEW COMPARISON:  11/26/2016 FINDINGS: Normal heart size. Stable aortic and hilar contours with atherosclerotic calcification. Large lung volumes and chronic interstitial coarsening. No focal pneumonia. No effusion or pneumothorax. IMPRESSION: 1. No focal opacity to suggest pneumonia. 2. Large lung volumes,  suspect COPD. Electronically Signed   By: Monte Fantasia M.D.   On: 10/08/2017 15:52     Management plans discussed with the patient, family and they are in agreement.  CODE STATUS:     Code Status Orders  (From admission, onward)        Start     Ordered   10/08/17 2013  Full code  Continuous     10/08/17 2012    Code Status History    Date Active Date Inactive Code Status Order ID Comments User Context   10/26/2014 17:46 10/27/2014 12:57 Full Code [id number]  Erline Levine, MD Inpatient    Advance Directive Documentation     Most Recent Value  Type of Advance Directive  Healthcare Power of Attorney  Pre-existing out of facility DNR order (yellow form or pink MOST form)  No data   "MOST" Form in Place?  No data      TOTAL TIME TAKING CARE OF THIS PATIENT: 40 minutes.    Carlos Hayden M.D on 10/10/2017 at 10:35 AM  Between 7am to 6pm - Pager - [phone] After 6pm go to [email or web address] - password [facility]  Office  [phone]  CC: Primary care physician; Carlos Pheasant, MD

## 2017-10-10 NOTE — Telephone Encounter (Signed)
No answer, no voicemail. Will continue to follow.

## 2017-10-10 NOTE — Clinical Social Work Note (Addendum)
CSW contacted [facility] and they can accept patient back today.  CSW attempted to contact patient's daughter, and left a message on voice mail.  [facility] said they can transport patient if his family can not, however [facility] will have to be notified before 3pm.  Patient to be d/c'ed today to [facility] memory care.  Patient and family agreeable to plans will transport via daughter's car RN to call report.  Jones Broom. [place], MSW, [place]  10/10/2017 11:30 AM

## 2017-10-11 NOTE — Telephone Encounter (Signed)
Transition Care Management Follow-up Telephone Call  How have you been since you were released from the hospital? Patient feeling better but sleeping some, during the hospital stay he was very confused when his fever was high and stated now he is still confused. PER daughter.   Do you understand why you were in the hospital? yes   Do you understand the discharge instrcutions? yes  Items Reviewed:  Medications reviewed: yes  Allergies reviewed: yes  Dietary changes reviewed: yes  Referrals reviewed: yes   Functional Questionnaire:   Activities of Daily Living (ADLs):   He states they are independent in the following: feeding, continence and toileting States they require assistance with the following: ambulation, bathing and hygiene, grooming and dressing   Any transportation issues/concerns?: no   Any patient concerns? yes   Confirmed importance and date/time of follow-up visits scheduled: yes   Confirmed with patient if condition begins to worsen call PCP or go to the ER.  Patient was given the Call-a-Nurse line [phone]: yes

## 2017-10-21 ENCOUNTER — Telehealth: Payer: Self-pay | Admitting: Internal Medicine

## 2017-10-21 NOTE — Telephone Encounter (Signed)
FYI

## 2017-10-21 NOTE — Telephone Encounter (Signed)
Copied from [place]. Topic: Quick Communication - See Telephone Encounter >> Oct 21, 2017  2:26 PM Synthia Innocent wrote: CRM for notification. See Telephone encounter for: [facility] did receive Home health referral, start of care will begin on 10/24/17 for PT  10/21/17.

## 2017-10-23 ENCOUNTER — Inpatient Hospital Stay: Payer: Medicare PPO | Admitting: Internal Medicine

## 2017-10-23 NOTE — Telephone Encounter (Signed)
Noted! Thank you

## 2017-10-23 NOTE — Telephone Encounter (Signed)
Pt daughter is unable to bring him she is in [place]. And will be here next week but was unable to do any of the appts I had for Tuesday.. She stated that she would call back on Monday to see if we had any openings

## 2017-10-24 DIAGNOSIS — Z9181 History of falling: Secondary | ICD-10-CM | POA: Diagnosis not present

## 2017-10-24 DIAGNOSIS — Z8546 Personal history of malignant neoplasm of prostate: Secondary | ICD-10-CM | POA: Diagnosis not present

## 2017-10-24 DIAGNOSIS — F0390 Unspecified dementia without behavioral disturbance: Secondary | ICD-10-CM | POA: Diagnosis not present

## 2017-10-24 DIAGNOSIS — I10 Essential (primary) hypertension: Secondary | ICD-10-CM | POA: Diagnosis not present

## 2017-10-24 DIAGNOSIS — M6281 Muscle weakness (generalized): Secondary | ICD-10-CM | POA: Diagnosis not present

## 2017-10-25 ENCOUNTER — Telehealth: Payer: Self-pay | Admitting: Internal Medicine

## 2017-10-25 NOTE — Telephone Encounter (Signed)
Left message for Kim to call back.  

## 2017-10-25 NOTE — Telephone Encounter (Signed)
Copied from Benjamin Perez [phone]. Topic: Quick Communication - See Telephone Encounter >> Oct 25, 2017  1:53 PM Arletha Grippe wrote: CRM for notification. See Telephone encounter for:   10/25/17. Kim [facility] called - verbal order home health PT 2 week 4  Cb is [phone]

## 2017-10-28 ENCOUNTER — Inpatient Hospital Stay: Payer: Medicare PPO | Admitting: Internal Medicine

## 2017-10-31 DIAGNOSIS — F0390 Unspecified dementia without behavioral disturbance: Secondary | ICD-10-CM | POA: Diagnosis not present

## 2017-10-31 DIAGNOSIS — I10 Essential (primary) hypertension: Secondary | ICD-10-CM | POA: Diagnosis not present

## 2017-10-31 DIAGNOSIS — Z8546 Personal history of malignant neoplasm of prostate: Secondary | ICD-10-CM | POA: Diagnosis not present

## 2017-10-31 DIAGNOSIS — Z9181 History of falling: Secondary | ICD-10-CM | POA: Diagnosis not present

## 2017-10-31 DIAGNOSIS — M6281 Muscle weakness (generalized): Secondary | ICD-10-CM | POA: Diagnosis not present

## 2017-11-01 DIAGNOSIS — I10 Essential (primary) hypertension: Secondary | ICD-10-CM | POA: Diagnosis not present

## 2017-11-01 DIAGNOSIS — F0390 Unspecified dementia without behavioral disturbance: Secondary | ICD-10-CM | POA: Diagnosis not present

## 2017-11-01 DIAGNOSIS — M6281 Muscle weakness (generalized): Secondary | ICD-10-CM | POA: Diagnosis not present

## 2017-11-01 DIAGNOSIS — Z8546 Personal history of malignant neoplasm of prostate: Secondary | ICD-10-CM | POA: Diagnosis not present

## 2017-11-01 DIAGNOSIS — Z9181 History of falling: Secondary | ICD-10-CM | POA: Diagnosis not present

## 2017-11-05 DIAGNOSIS — I10 Essential (primary) hypertension: Secondary | ICD-10-CM | POA: Diagnosis not present

## 2017-11-05 DIAGNOSIS — F0390 Unspecified dementia without behavioral disturbance: Secondary | ICD-10-CM | POA: Diagnosis not present

## 2017-11-05 DIAGNOSIS — Z8546 Personal history of malignant neoplasm of prostate: Secondary | ICD-10-CM | POA: Diagnosis not present

## 2017-11-05 DIAGNOSIS — M6281 Muscle weakness (generalized): Secondary | ICD-10-CM | POA: Diagnosis not present

## 2017-11-05 DIAGNOSIS — Z9181 History of falling: Secondary | ICD-10-CM | POA: Diagnosis not present

## 2017-11-07 DIAGNOSIS — Z9181 History of falling: Secondary | ICD-10-CM | POA: Diagnosis not present

## 2017-11-07 DIAGNOSIS — Z8546 Personal history of malignant neoplasm of prostate: Secondary | ICD-10-CM | POA: Diagnosis not present

## 2017-11-07 DIAGNOSIS — I10 Essential (primary) hypertension: Secondary | ICD-10-CM | POA: Diagnosis not present

## 2017-11-07 DIAGNOSIS — M6281 Muscle weakness (generalized): Secondary | ICD-10-CM | POA: Diagnosis not present

## 2017-11-07 DIAGNOSIS — F0390 Unspecified dementia without behavioral disturbance: Secondary | ICD-10-CM | POA: Diagnosis not present

## 2017-11-12 DIAGNOSIS — Z8546 Personal history of malignant neoplasm of prostate: Secondary | ICD-10-CM | POA: Diagnosis not present

## 2017-11-12 DIAGNOSIS — Z9181 History of falling: Secondary | ICD-10-CM | POA: Diagnosis not present

## 2017-11-12 DIAGNOSIS — M6281 Muscle weakness (generalized): Secondary | ICD-10-CM | POA: Diagnosis not present

## 2017-11-12 DIAGNOSIS — F0390 Unspecified dementia without behavioral disturbance: Secondary | ICD-10-CM | POA: Diagnosis not present

## 2017-11-12 DIAGNOSIS — I10 Essential (primary) hypertension: Secondary | ICD-10-CM | POA: Diagnosis not present

## 2017-11-14 DIAGNOSIS — I10 Essential (primary) hypertension: Secondary | ICD-10-CM | POA: Diagnosis not present

## 2017-11-14 DIAGNOSIS — M6281 Muscle weakness (generalized): Secondary | ICD-10-CM | POA: Diagnosis not present

## 2017-11-14 DIAGNOSIS — F0390 Unspecified dementia without behavioral disturbance: Secondary | ICD-10-CM | POA: Diagnosis not present

## 2017-11-14 DIAGNOSIS — Z9181 History of falling: Secondary | ICD-10-CM | POA: Diagnosis not present

## 2017-11-14 DIAGNOSIS — Z8546 Personal history of malignant neoplasm of prostate: Secondary | ICD-10-CM | POA: Diagnosis not present

## 2017-11-18 DIAGNOSIS — F0390 Unspecified dementia without behavioral disturbance: Secondary | ICD-10-CM | POA: Diagnosis not present

## 2017-11-18 DIAGNOSIS — Z9181 History of falling: Secondary | ICD-10-CM | POA: Diagnosis not present

## 2017-11-18 DIAGNOSIS — I10 Essential (primary) hypertension: Secondary | ICD-10-CM | POA: Diagnosis not present

## 2017-11-18 DIAGNOSIS — Z8546 Personal history of malignant neoplasm of prostate: Secondary | ICD-10-CM | POA: Diagnosis not present

## 2017-11-18 DIAGNOSIS — M6281 Muscle weakness (generalized): Secondary | ICD-10-CM | POA: Diagnosis not present

## 2017-11-20 DIAGNOSIS — F0390 Unspecified dementia without behavioral disturbance: Secondary | ICD-10-CM | POA: Diagnosis not present

## 2017-11-20 DIAGNOSIS — M6281 Muscle weakness (generalized): Secondary | ICD-10-CM | POA: Diagnosis not present

## 2017-11-20 DIAGNOSIS — Z8546 Personal history of malignant neoplasm of prostate: Secondary | ICD-10-CM | POA: Diagnosis not present

## 2017-11-20 DIAGNOSIS — Z9181 History of falling: Secondary | ICD-10-CM | POA: Diagnosis not present

## 2017-11-20 DIAGNOSIS — I10 Essential (primary) hypertension: Secondary | ICD-10-CM | POA: Diagnosis not present

## 2017-11-26 ENCOUNTER — Other Ambulatory Visit: Payer: Self-pay | Admitting: Internal Medicine

## 2017-11-26 DIAGNOSIS — I10 Essential (primary) hypertension: Secondary | ICD-10-CM

## 2017-12-03 ENCOUNTER — Ambulatory Visit: Payer: Self-pay

## 2017-12-04 ENCOUNTER — Ambulatory Visit (INDEPENDENT_AMBULATORY_CARE_PROVIDER_SITE_OTHER): Payer: Medicare PPO | Admitting: Internal Medicine

## 2017-12-04 ENCOUNTER — Encounter: Payer: Self-pay | Admitting: Internal Medicine

## 2017-12-04 DIAGNOSIS — R413 Other amnesia: Secondary | ICD-10-CM

## 2017-12-04 DIAGNOSIS — E7800 Pure hypercholesterolemia, unspecified: Secondary | ICD-10-CM

## 2017-12-04 DIAGNOSIS — D696 Thrombocytopenia, unspecified: Secondary | ICD-10-CM

## 2017-12-04 DIAGNOSIS — I10 Essential (primary) hypertension: Secondary | ICD-10-CM

## 2017-12-04 DIAGNOSIS — I7143 Infrarenal abdominal aortic aneurysm, without rupture: Secondary | ICD-10-CM

## 2017-12-04 DIAGNOSIS — I714 Abdominal aortic aneurysm, without rupture: Secondary | ICD-10-CM

## 2017-12-04 NOTE — Progress Notes (Signed)
Patient ID: Carlos Hayden., male   DOB: October 14, 1925, 84 y.o.   MRN: [id number]   Subjective:    Patient ID: Carlos Hayden., male    DOB: Jan 09, 1926, 84 y.o.   MRN: [id number]  HPI  Patient here for a scheduled follow up.  He is accompanied by his daughter.  History obtained from both of them.  He is now residing in assisted living - memory care unit - with his wife.  He reports he is doing relatively well.  States he misses being home, but likes the facility.  Eating.  Weight has improved.  No nausea or vomiting.  Bowels moving.  Breathing stable.  He denies any pain.  No urine change.  Daughter reports that he has been getting up at night.  Puts on his clothes and walks around.  Tells everyone hello and then goes back to bed.  He does not remember doing this.  Some concern with his memory.  Was admitted 10/08/17 with acute viral syndrome with near syncopal episode.  IVFs.  Better now.     Past Medical History:  Diagnosis Date  . Cancer ([place]) 11/28/10   Prostate  . Hypertension    Past Surgical History:  Procedure Laterality Date  . TONSILLECTOMY  1930's  . TOTAL KNEE ARTHROPLASTY     right   Family History  Problem Relation Age of Onset  . Prostate cancer Father   . Lung cancer Mother   . Cancer Daughter   . Stroke Daughter   . Heart attack Daughter    Social History   Socioeconomic History  . Marital status: Married    Spouse name: None  . Number of children: None  . Years of education: None  . Highest education level: None  Social Needs  . Financial resource strain: None  . Food insecurity - worry: None  . Food insecurity - inability: None  . Transportation needs - medical: None  . Transportation needs - non-medical: None  Occupational History  . None  Tobacco Use  . Smoking status: Former Research scientist (life sciences)  . Smokeless tobacco: Never Used  . Tobacco comment: QUIT SMOKING IN THE EARLY 50'S  Substance and Sexual Activity  . Alcohol use: No   Alcohol/week: 0.0 oz  . Drug use: No  . Sexual activity: None  Other Topics Concern  . None  Social History Narrative  . None    Outpatient Encounter Medications as of 12/04/2017  Medication Sig  . donepezil (ARICEPT) 5 MG tablet TAKE 1 TABLET BY MOUTH ONCE DAILY AT BEDTIME FOR DEMENTIA  . lisinopril (PRINIVIL,ZESTRIL) 2.5 MG tablet TAKE 1 TABLET BY MOUTH ONCE DAILY FOR KIDNEYS/BP   No facility-administered encounter medications on file as of 12/04/2017.     Review of Systems  Constitutional: Negative for appetite change.       Eating better.  Weight is up.   HENT: Negative for congestion and sinus pressure.   Respiratory: Negative for cough, chest tightness and shortness of breath.   Cardiovascular: Negative for chest pain, palpitations and leg swelling.  Gastrointestinal: Negative for abdominal pain, diarrhea, nausea and vomiting.  Genitourinary: Negative for difficulty urinating and dysuria.  Musculoskeletal: Negative for joint swelling and myalgias.  Skin: Negative for color change and rash.  Neurological: Negative for dizziness, light-headedness and headaches.  Psychiatric/Behavioral: Negative for agitation and dysphoric mood.       Objective:     Blood pressure rechecked by me:  136/72  Physical Exam  Constitutional: He  appears well-developed and well-nourished. No distress.  HENT:  Nose: Nose normal.  Mouth/Throat: Oropharynx is clear and moist.  Neck: Neck supple. No thyromegaly present.  Cardiovascular: Normal rate and regular rhythm.  Pulmonary/Chest: Effort normal and breath sounds normal. No respiratory distress.  Abdominal: Soft. Bowel sounds are normal. There is no tenderness.  Musculoskeletal: He exhibits no edema or tenderness.  Lymphadenopathy:    He has no cervical adenopathy.  Skin: No rash noted. No erythema.  Psychiatric: He has a normal mood and affect. His behavior is normal.    BP 128/70 (BP Location: Left Arm, Patient Position: Sitting, Cuff  Size: Normal)   Pulse (!) 56   Temp 98 F (36.7 C) (Oral)   Resp 18   Wt 172 lb 3.2 oz (78.1 kg)   SpO2 96%   BMI 22.72 kg/m  Wt Readings from Last 3 Encounters:  12/04/17 172 lb 3.2 oz (78.1 kg)  10/08/17 169 lb 3.2 oz (76.7 kg)  07/03/17 153 lb 6 oz (69.6 kg)     Lab Results  Component Value Date   WBC 8.6 10/08/2017   HGB 13.7 10/08/2017   HCT 40.4 10/08/2017   PLT 152 10/08/2017   GLUCOSE 111 (H) 10/08/2017   CHOL 155 03/15/2017   TRIG 99.0 03/15/2017   HDL 34.00 (L) 03/15/2017   LDLCALC 101 (H) 03/15/2017   ALT 15 (L) 10/08/2017   AST 29 10/08/2017   NA 138 10/08/2017   K 4.2 10/08/2017   CL 105 10/08/2017   CREATININE 0.81 10/08/2017   BUN 25 (H) 10/08/2017   CO2 24 10/08/2017   TSH 3.10 11/26/2016   PSA 5.58 01/21/2006   INR 1.1 10/26/2014   HGBA1C 5.5 03/15/2017    Ct Head Wo Contrast  Result Date: 10/08/2017 CLINICAL DATA:  Syncope with questionable fall EXAM: CT HEAD WITHOUT CONTRAST TECHNIQUE: Contiguous axial images were obtained from the base of the skull through the vertex without intravenous contrast. COMPARISON:  October 27, 2014 FINDINGS: Brain: Moderate diffuse atrophy is stable. There is no intracranial mass, hemorrhage, extra-axial fluid collection, or midline shift. There is patchy small vessel disease in the centra semiovale bilaterally. There is a tiny lacunar infarct in the left thalamus. No acute appearing infarct evident. Vascular: No hyperdense vessel evident. There is calcification in each carotid siphon region. Skull: Bony calvarium appears intact. Sinuses/Orbits: There is mucosal thickening and opacification involving multiple ethmoid air cells. Other visualized paranasal sinuses are clear. Orbits appear symmetric bilaterally. Other: Mastoid air cells are clear. There is debris in each external auditory canal. IMPRESSION: 1. Atrophy with patchy periventricular small vessel disease. Tiny prior lacunar infarct left thalamus. No acute infarct  evident. No mass or hemorrhage. 2.  Foci of arterial vascular calcification noted. 3.  Ethmoid sinus disease bilaterally. 4.  Probable cerumen in each external auditory canal. Electronically Signed   By: Lowella Grip III M.D.   On: 10/08/2017 15:50   Dg Chest Port 1 View  Result Date: 10/08/2017 CLINICAL DATA:  Fever and cough EXAM: PORTABLE CHEST 1 VIEW COMPARISON:  11/26/2016 FINDINGS: Normal heart size. Stable aortic and hilar contours with atherosclerotic calcification. Large lung volumes and chronic interstitial coarsening. No focal pneumonia. No effusion or pneumothorax. IMPRESSION: 1. No focal opacity to suggest pneumonia. 2. Large lung volumes, suspect COPD. Electronically Signed   By: Monte Fantasia M.D.   On: 10/08/2017 15:52       Assessment & Plan:   Problem List Items Addressed This Visit  Aneurysm of infrarenal abdominal aorta (HCC)    Found on previous CT.  Was scheduled appt with vascular.  Does not appear to have kept appt.  Need to reschedule.        Essential hypertension    Blood pressure under good control.  Continue same medication regimen.  Follow pressures.  Follow metabolic panel.        Hypercholesterolemia    Follow lipid panel.       Memory change    See previous note.  Saw neurology.  Started on aricept.  Taking.  Concern regarding wandering at night - as outlined.  Request referral back to neurology for further evaluation.        Relevant Orders   Ambulatory referral to Neurology   Thrombocytopenia ([place])    Follow cbc.            Einar Pheasant, MD

## 2017-12-07 ENCOUNTER — Encounter: Payer: Self-pay | Admitting: Internal Medicine

## 2017-12-07 NOTE — Assessment & Plan Note (Signed)
Follow cbc.  

## 2017-12-07 NOTE — Assessment & Plan Note (Signed)
Blood pressure under good control.  Continue same medication regimen.  Follow pressures.  Follow metabolic panel.   

## 2017-12-07 NOTE — Assessment & Plan Note (Signed)
See previous note.  Saw neurology.  Started on aricept.  Taking.  Concern regarding wandering at night - as outlined.  Request referral back to neurology for further evaluation.

## 2017-12-07 NOTE — Assessment & Plan Note (Signed)
Follow lipid panel.   

## 2017-12-07 NOTE — Assessment & Plan Note (Signed)
Found on previous CT.  Was scheduled appt with vascular.  Does not appear to have kept appt.  Need to reschedule.

## 2017-12-09 ENCOUNTER — Other Ambulatory Visit: Payer: Self-pay | Admitting: Internal Medicine

## 2017-12-09 DIAGNOSIS — I722 Aneurysm of renal artery: Secondary | ICD-10-CM

## 2017-12-09 NOTE — Progress Notes (Signed)
Order placed for referral to vascular surgery.  

## 2017-12-12 ENCOUNTER — Telehealth: Payer: Self-pay

## 2017-12-12 NOTE — Telephone Encounter (Signed)
Shelia calls from [phone] needing Plan of Care faxed over start date of care from 10/24/17 will re-fax to office for signature. Never received .  Please fax back [phone]

## 2017-12-13 NOTE — Telephone Encounter (Signed)
Placed in Dr. Lars Mage folder for review.

## 2017-12-16 NOTE — Telephone Encounter (Signed)
Faxed

## 2017-12-20 ENCOUNTER — Telehealth: Payer: Self-pay

## 2017-12-20 NOTE — Telephone Encounter (Signed)
Left message to call back  

## 2017-12-20 NOTE — Telephone Encounter (Signed)
Copied from [place] [phone]. Topic: Inquiry >> Dec 20, 2017 12:41 PM Conception Chancy, NT wrote: Rehab director is calling to confirm that the office received a fax she sent over. Please advise. Ok'd to leave VM  Danny Cb# [phone]

## 2018-01-14 DIAGNOSIS — E860 Dehydration: Secondary | ICD-10-CM | POA: Diagnosis not present

## 2018-01-14 DIAGNOSIS — M25612 Stiffness of left shoulder, not elsewhere classified: Secondary | ICD-10-CM | POA: Diagnosis not present

## 2018-01-14 DIAGNOSIS — M6281 Muscle weakness (generalized): Secondary | ICD-10-CM | POA: Diagnosis not present

## 2018-01-14 DIAGNOSIS — R2681 Unsteadiness on feet: Secondary | ICD-10-CM | POA: Diagnosis not present

## 2018-01-29 DIAGNOSIS — Z741 Need for assistance with personal care: Secondary | ICD-10-CM | POA: Diagnosis not present

## 2018-01-29 DIAGNOSIS — M25612 Stiffness of left shoulder, not elsewhere classified: Secondary | ICD-10-CM | POA: Diagnosis not present

## 2018-01-29 DIAGNOSIS — M6281 Muscle weakness (generalized): Secondary | ICD-10-CM | POA: Diagnosis not present

## 2018-01-29 DIAGNOSIS — E860 Dehydration: Secondary | ICD-10-CM | POA: Diagnosis not present

## 2018-02-04 DIAGNOSIS — M6281 Muscle weakness (generalized): Secondary | ICD-10-CM | POA: Diagnosis not present

## 2018-02-04 DIAGNOSIS — E860 Dehydration: Secondary | ICD-10-CM | POA: Diagnosis not present

## 2018-02-04 DIAGNOSIS — M25612 Stiffness of left shoulder, not elsewhere classified: Secondary | ICD-10-CM | POA: Diagnosis not present

## 2018-02-04 DIAGNOSIS — Z741 Need for assistance with personal care: Secondary | ICD-10-CM | POA: Diagnosis not present

## 2018-02-06 DIAGNOSIS — Z741 Need for assistance with personal care: Secondary | ICD-10-CM | POA: Diagnosis not present

## 2018-02-06 DIAGNOSIS — E860 Dehydration: Secondary | ICD-10-CM | POA: Diagnosis not present

## 2018-02-06 DIAGNOSIS — M6281 Muscle weakness (generalized): Secondary | ICD-10-CM | POA: Diagnosis not present

## 2018-02-06 DIAGNOSIS — M25612 Stiffness of left shoulder, not elsewhere classified: Secondary | ICD-10-CM | POA: Diagnosis not present

## 2018-02-10 DIAGNOSIS — E860 Dehydration: Secondary | ICD-10-CM | POA: Diagnosis not present

## 2018-02-10 DIAGNOSIS — M6281 Muscle weakness (generalized): Secondary | ICD-10-CM | POA: Diagnosis not present

## 2018-02-10 DIAGNOSIS — M25612 Stiffness of left shoulder, not elsewhere classified: Secondary | ICD-10-CM | POA: Diagnosis not present

## 2018-02-10 DIAGNOSIS — Z741 Need for assistance with personal care: Secondary | ICD-10-CM | POA: Diagnosis not present

## 2018-02-12 DIAGNOSIS — Z741 Need for assistance with personal care: Secondary | ICD-10-CM | POA: Diagnosis not present

## 2018-02-12 DIAGNOSIS — E860 Dehydration: Secondary | ICD-10-CM | POA: Diagnosis not present

## 2018-02-12 DIAGNOSIS — M6281 Muscle weakness (generalized): Secondary | ICD-10-CM | POA: Diagnosis not present

## 2018-02-12 DIAGNOSIS — M25612 Stiffness of left shoulder, not elsewhere classified: Secondary | ICD-10-CM | POA: Diagnosis not present

## 2018-02-18 DIAGNOSIS — Z741 Need for assistance with personal care: Secondary | ICD-10-CM | POA: Diagnosis not present

## 2018-02-18 DIAGNOSIS — M6281 Muscle weakness (generalized): Secondary | ICD-10-CM | POA: Diagnosis not present

## 2018-02-18 DIAGNOSIS — E860 Dehydration: Secondary | ICD-10-CM | POA: Diagnosis not present

## 2018-02-18 DIAGNOSIS — M25612 Stiffness of left shoulder, not elsewhere classified: Secondary | ICD-10-CM | POA: Diagnosis not present

## 2018-02-19 DIAGNOSIS — M25612 Stiffness of left shoulder, not elsewhere classified: Secondary | ICD-10-CM | POA: Diagnosis not present

## 2018-02-19 DIAGNOSIS — M6281 Muscle weakness (generalized): Secondary | ICD-10-CM | POA: Diagnosis not present

## 2018-02-19 DIAGNOSIS — Z741 Need for assistance with personal care: Secondary | ICD-10-CM | POA: Diagnosis not present

## 2018-02-19 DIAGNOSIS — E860 Dehydration: Secondary | ICD-10-CM | POA: Diagnosis not present

## 2018-02-25 DIAGNOSIS — E860 Dehydration: Secondary | ICD-10-CM | POA: Diagnosis not present

## 2018-02-25 DIAGNOSIS — M6281 Muscle weakness (generalized): Secondary | ICD-10-CM | POA: Diagnosis not present

## 2018-02-25 DIAGNOSIS — Z741 Need for assistance with personal care: Secondary | ICD-10-CM | POA: Diagnosis not present

## 2018-02-25 DIAGNOSIS — M25612 Stiffness of left shoulder, not elsewhere classified: Secondary | ICD-10-CM | POA: Diagnosis not present

## 2018-02-26 DIAGNOSIS — M6281 Muscle weakness (generalized): Secondary | ICD-10-CM | POA: Diagnosis not present

## 2018-02-26 DIAGNOSIS — Z741 Need for assistance with personal care: Secondary | ICD-10-CM | POA: Diagnosis not present

## 2018-02-26 DIAGNOSIS — E860 Dehydration: Secondary | ICD-10-CM | POA: Diagnosis not present

## 2018-02-26 DIAGNOSIS — M25612 Stiffness of left shoulder, not elsewhere classified: Secondary | ICD-10-CM | POA: Diagnosis not present

## 2018-03-06 ENCOUNTER — Ambulatory Visit: Payer: Medicare PPO | Admitting: Internal Medicine

## 2018-03-06 DIAGNOSIS — E860 Dehydration: Secondary | ICD-10-CM | POA: Diagnosis not present

## 2018-03-06 DIAGNOSIS — M6281 Muscle weakness (generalized): Secondary | ICD-10-CM | POA: Diagnosis not present

## 2018-03-06 DIAGNOSIS — Z741 Need for assistance with personal care: Secondary | ICD-10-CM | POA: Diagnosis not present

## 2018-03-06 DIAGNOSIS — M25612 Stiffness of left shoulder, not elsewhere classified: Secondary | ICD-10-CM | POA: Diagnosis not present

## 2018-03-06 DIAGNOSIS — Z0289 Encounter for other administrative examinations: Secondary | ICD-10-CM

## 2018-03-12 DIAGNOSIS — M6281 Muscle weakness (generalized): Secondary | ICD-10-CM | POA: Diagnosis not present

## 2018-03-12 DIAGNOSIS — E860 Dehydration: Secondary | ICD-10-CM | POA: Diagnosis not present

## 2018-03-12 DIAGNOSIS — M25612 Stiffness of left shoulder, not elsewhere classified: Secondary | ICD-10-CM | POA: Diagnosis not present

## 2018-03-12 DIAGNOSIS — Z741 Need for assistance with personal care: Secondary | ICD-10-CM | POA: Diagnosis not present

## 2018-03-22 ENCOUNTER — Other Ambulatory Visit: Payer: Self-pay | Admitting: Internal Medicine

## 2018-03-22 DIAGNOSIS — I10 Essential (primary) hypertension: Secondary | ICD-10-CM

## 2018-04-15 ENCOUNTER — Telehealth: Payer: Self-pay

## 2018-04-15 DIAGNOSIS — N3945 Continuous leakage: Secondary | ICD-10-CM

## 2018-04-15 NOTE — Telephone Encounter (Signed)
Verbal order for ua and culture authorized

## 2018-04-15 NOTE — Telephone Encounter (Signed)
Order faxed.

## 2018-04-15 NOTE — Telephone Encounter (Signed)
[facility] is requesting order for UA for pt due to increased confusion. Patient has been urinating in bedroom floor. He normally has some confusion but confusion has gotten worse. Patient is not feeling well and did not eat lunch today, no other symptoms noted and VS were WNL.

## 2018-04-17 ENCOUNTER — Other Ambulatory Visit: Payer: Self-pay

## 2018-04-17 ENCOUNTER — Other Ambulatory Visit (INDEPENDENT_AMBULATORY_CARE_PROVIDER_SITE_OTHER): Payer: Medicare PPO

## 2018-04-17 DIAGNOSIS — R300 Dysuria: Secondary | ICD-10-CM | POA: Diagnosis not present

## 2018-04-17 DIAGNOSIS — N3945 Continuous leakage: Secondary | ICD-10-CM | POA: Diagnosis not present

## 2018-04-17 LAB — URINALYSIS, ROUTINE W REFLEX MICROSCOPIC
BILIRUBIN URINE: NEGATIVE
Ketones, ur: NEGATIVE
NITRITE: NEGATIVE
RBC / HPF: NONE SEEN (ref 0–?)
Specific Gravity, Urine: 1.01 (ref 1.000–1.030)
Total Protein, Urine: NEGATIVE
URINE GLUCOSE: NEGATIVE
Urobilinogen, UA: 0.2 (ref 0.0–1.0)
pH: 6 (ref 5.0–8.0)

## 2018-04-17 NOTE — Telephone Encounter (Signed)
urien labs ordered

## 2018-04-17 NOTE — Telephone Encounter (Signed)
Pt caregiver came and brought urine specimen in. No orders placed.

## 2018-04-17 NOTE — Telephone Encounter (Signed)
Orders have been placed.

## 2018-04-18 ENCOUNTER — Other Ambulatory Visit: Payer: Self-pay | Admitting: Internal Medicine

## 2018-04-18 ENCOUNTER — Other Ambulatory Visit: Payer: Self-pay

## 2018-04-18 LAB — URINE CULTURE
MICRO NUMBER: 90852212
RESULT: NO GROWTH
SPECIMEN QUALITY: ADEQUATE

## 2018-04-18 MED ORDER — CIPROFLOXACIN HCL 500 MG PO TABS: 500.0000 mg | ORAL_TABLET | Freq: Two times a day (BID) | ORAL | 0 refills | Status: DC

## 2018-05-05 ENCOUNTER — Other Ambulatory Visit: Payer: Self-pay | Admitting: Internal Medicine

## 2018-05-26 ENCOUNTER — Other Ambulatory Visit: Payer: Self-pay | Admitting: Internal Medicine

## 2018-05-26 DIAGNOSIS — I10 Essential (primary) hypertension: Secondary | ICD-10-CM

## 2018-06-24 ENCOUNTER — Telehealth: Payer: Self-pay | Admitting: Internal Medicine

## 2018-06-24 NOTE — Telephone Encounter (Signed)
Spoke with Linda to have her fax over orders 

## 2018-06-24 NOTE — Telephone Encounter (Unsigned)
Copied from [place] [phone]. Topic: General - Other >> Jun 24, 2018 10:51 AM Carolyn Stare wrote:  Vaughan Basta with [facility] faxed over a diet order on 06/18/18 and need it sign and faxed back to her

## 2018-06-24 NOTE — Telephone Encounter (Signed)
Order needs to signed

## 2018-06-25 NOTE — Telephone Encounter (Signed)
Diet orders placed in your quick sign  

## 2018-06-25 NOTE — Telephone Encounter (Signed)
Signed and placed in box.   

## 2018-06-25 NOTE — Telephone Encounter (Signed)
Order faxed.

## 2018-07-02 ENCOUNTER — Ambulatory Visit (INDEPENDENT_AMBULATORY_CARE_PROVIDER_SITE_OTHER): Payer: Medicare PPO | Admitting: Internal Medicine

## 2018-07-02 ENCOUNTER — Encounter: Payer: Self-pay | Admitting: Internal Medicine

## 2018-07-02 DIAGNOSIS — D696 Thrombocytopenia, unspecified: Secondary | ICD-10-CM | POA: Diagnosis not present

## 2018-07-02 DIAGNOSIS — E7800 Pure hypercholesterolemia, unspecified: Secondary | ICD-10-CM

## 2018-07-02 DIAGNOSIS — R4182 Altered mental status, unspecified: Secondary | ICD-10-CM | POA: Diagnosis not present

## 2018-07-02 DIAGNOSIS — Z23 Encounter for immunization: Secondary | ICD-10-CM

## 2018-07-02 DIAGNOSIS — I10 Essential (primary) hypertension: Secondary | ICD-10-CM | POA: Diagnosis not present

## 2018-07-02 LAB — CBC WITH DIFFERENTIAL/PLATELET
BASOS ABS: 0 10*3/uL (ref 0.0–0.1)
BASOS PCT: 0.7 % (ref 0.0–3.0)
EOS PCT: 3.8 % (ref 0.0–5.0)
Eosinophils Absolute: 0.2 10*3/uL (ref 0.0–0.7)
HEMATOCRIT: 44.6 % (ref 39.0–52.0)
Hemoglobin: 15.4 g/dL (ref 13.0–17.0)
LYMPHS ABS: 1.5 10*3/uL (ref 0.7–4.0)
LYMPHS PCT: 23.3 % (ref 12.0–46.0)
MCHC: 34.6 g/dL (ref 30.0–36.0)
MCV: 95.6 fl (ref 78.0–100.0)
MONOS PCT: 9.3 % (ref 3.0–12.0)
Monocytes Absolute: 0.6 10*3/uL (ref 0.1–1.0)
NEUTROS ABS: 4 10*3/uL (ref 1.4–7.7)
NEUTROS PCT: 62.9 % (ref 43.0–77.0)
PLATELETS: 177 10*3/uL (ref 150.0–400.0)
RBC: 4.67 Mil/uL (ref 4.22–5.81)
RDW: 13.4 % (ref 11.5–15.5)
WBC: 6.4 10*3/uL (ref 4.0–10.5)

## 2018-07-02 LAB — HEPATIC FUNCTION PANEL
ALBUMIN: 4.1 g/dL (ref 3.5–5.2)
ALK PHOS: 63 U/L (ref 39–117)
ALT: 10 U/L (ref 0–53)
AST: 15 U/L (ref 0–37)
Bilirubin, Direct: 0.1 mg/dL (ref 0.0–0.3)
Total Bilirubin: 0.6 mg/dL (ref 0.2–1.2)
Total Protein: 6.8 g/dL (ref 6.0–8.3)

## 2018-07-02 LAB — URINALYSIS, ROUTINE W REFLEX MICROSCOPIC
Bilirubin Urine: NEGATIVE
KETONES UR: NEGATIVE
NITRITE: NEGATIVE
Specific Gravity, Urine: 1.02 (ref 1.000–1.030)
TOTAL PROTEIN, URINE-UPE24: NEGATIVE
URINE GLUCOSE: NEGATIVE
UROBILINOGEN UA: 0.2 (ref 0.0–1.0)
pH: 6 (ref 5.0–8.0)

## 2018-07-02 LAB — LIPID PANEL
CHOLESTEROL: 162 mg/dL (ref 0–200)
HDL: 32.7 mg/dL — AB (ref >39.00)
LDL Cholesterol: 93 mg/dL (ref 0–99)
NONHDL: 128.95
Total CHOL/HDL Ratio: 5
Triglycerides: 178 mg/dL — ABNORMAL HIGH (ref 0.0–149.0)
VLDL: 35.6 mg/dL (ref 0.0–40.0)

## 2018-07-02 LAB — BASIC METABOLIC PANEL
BUN: 20 mg/dL (ref 6–23)
CHLORIDE: 104 meq/L (ref 96–112)
CO2: 28 meq/L (ref 19–32)
Calcium: 9.3 mg/dL (ref 8.4–10.5)
Creatinine, Ser: 0.87 mg/dL (ref 0.40–1.50)
GFR: 87.58 mL/min (ref >60.00)
Glucose, Bld: 81 mg/dL (ref 70–99)
Potassium: 4 mEq/L (ref 3.5–5.1)
Sodium: 141 mEq/L (ref 135–145)

## 2018-07-02 LAB — TSH: TSH: 2.87 u[IU]/mL (ref 0.35–4.50)

## 2018-07-02 LAB — VITAMIN B12: VITAMIN B 12: 226 pg/mL (ref 211–911)

## 2018-07-02 NOTE — Progress Notes (Addendum)
Patient ID: Carlos Clark., male   DOB: [birth year], 84 y.o.   MRN: [id number]   Subjective:    Patient ID: Carlos Clark., male    DOB: 10-Jan-1926, 84 y.o.   MRN: [id number]  HPI  Patient here for a scheduled follow up.  He is accompanied by his caretaker.  History obtained from both of them.  He denies any pain.  Is eating.  No chest pain.  No sob.  No acid reflux.  No abdominal pain.  Bowels moving.  Caretaker reports that pt has been sleeping more.  Not going to eat some days.  Not wanting to bathe.  Still staying and getting along well with his wife.     Past Medical History:  Diagnosis Date  . Cancer ([place]) 11/28/10   Prostate  . Hypertension    Past Surgical History:  Procedure Laterality Date  . TONSILLECTOMY  1930's  . TOTAL KNEE ARTHROPLASTY     right   Family History  Problem Relation Age of Onset  . Prostate cancer Father   . Lung cancer Mother   . Cancer Daughter   . Stroke Daughter   . Heart attack Daughter    Social History   Socioeconomic History  . Marital status: Married    Spouse name: Not on file  . Number of children: Not on file  . Years of education: Not on file  . Highest education level: Not on file  Occupational History  . Not on file  Social Needs  . Financial resource strain: Not on file  . Food insecurity:    Worry: Not on file    Inability: Not on file  . Transportation needs:    Medical: Not on file    Non-medical: Not on file  Tobacco Use  . Smoking status: Former Research scientist (life sciences)  . Smokeless tobacco: Never Used  . Tobacco comment: QUIT SMOKING IN THE EARLY 50'S  Substance and Sexual Activity  . Alcohol use: No    Alcohol/week: 0.0 standard drinks  . Drug use: No  . Sexual activity: Not on file  Lifestyle  . Physical activity:    Days per week: Not on file    Minutes per session: Not on file  . Stress: Not on file  Relationships  . Social connections:    Talks on phone: Not on file    Gets together: Not  on file    Attends religious service: Not on file    Active member of club or organization: Not on file    Attends meetings of clubs or organizations: Not on file    Relationship status: Not on file  Other Topics Concern  . Not on file  Social History Narrative  . Not on file    Outpatient Encounter Medications as of 07/02/2018  Medication Sig  . donepezil (ARICEPT) 5 MG tablet TAKE 1 TABLET BY MOUTH ONCE DAILY AT BEDTIME  . lisinopril (PRINIVIL,ZESTRIL) 2.5 MG tablet TAKE 1 TABLET BY MOUTH ONCE PER DAY  . [DISCONTINUED] ciprofloxacin (CIPRO) 500 MG tablet Take 1 tablet (500 mg total) by mouth 2 (two) times daily.   No facility-administered encounter medications on file as of 07/02/2018.     Review of Systems  Constitutional: Negative for appetite change and unexpected weight change.  HENT: Negative for congestion and sinus pressure.   Respiratory: Negative for cough, chest tightness and shortness of breath.   Cardiovascular: Negative for chest pain, palpitations and leg swelling.  Gastrointestinal: Negative for abdominal  pain, diarrhea, nausea and vomiting.  Genitourinary: Negative for difficulty urinating and dysuria.  Musculoskeletal: Negative for joint swelling and myalgias.  Skin: Negative for color change and rash.  Neurological: Negative for dizziness, light-headedness and headaches.  Psychiatric/Behavioral: Negative for agitation and dysphoric mood.       Objective:    Physical Exam  Constitutional: He appears well-developed and well-nourished. No distress.  HENT:  Nose: Nose normal.  Mouth/Throat: Oropharynx is clear and moist.  Neck: Neck supple. No thyromegaly present.  Cardiovascular: Normal rate and regular rhythm.  Pulmonary/Chest: Effort normal and breath sounds normal. No respiratory distress.  Abdominal: Soft. Bowel sounds are normal. There is no tenderness.  Musculoskeletal: He exhibits no edema or tenderness.  Lymphadenopathy:    He has no cervical  adenopathy.  Skin: No rash noted. No erythema.  Psychiatric: He has a normal mood and affect. His behavior is normal.    BP (!) 110/58 (BP Location: Left Arm, Patient Position: Sitting, Cuff Size: Normal)   Pulse (!) 56   Temp 97.8 F (36.6 C) (Oral)   Resp 18   Wt 171 lb 9.6 oz (77.8 kg)   SpO2 97%   BMI 22.64 kg/m  Wt Readings from Last 3 Encounters:  07/02/18 171 lb 9.6 oz (77.8 kg)  12/04/17 172 lb 3.2 oz (78.1 kg)  10/08/17 169 lb 3.2 oz (76.7 kg)     Lab Results  Component Value Date   WBC 6.4 07/02/2018   HGB 15.4 07/02/2018   HCT 44.6 07/02/2018   PLT 177.0 07/02/2018   GLUCOSE 81 07/02/2018   CHOL 162 07/02/2018   TRIG 178.0 (H) 07/02/2018   HDL 32.70 (L) 07/02/2018   LDLCALC 93 07/02/2018   ALT 10 07/02/2018   AST 15 07/02/2018   NA 141 07/02/2018   K 4.0 07/02/2018   CL 104 07/02/2018   CREATININE 0.87 07/02/2018   BUN 20 07/02/2018   CO2 28 07/02/2018   TSH 2.87 07/02/2018   PSA 5.58 01/21/2006   INR 1.1 10/26/2014   HGBA1C 5.5 03/15/2017    Ct Head Wo Contrast  Result Date: 10/08/2017 CLINICAL DATA:  Syncope with questionable fall EXAM: CT HEAD WITHOUT CONTRAST TECHNIQUE: Contiguous axial images were obtained from the base of the skull through the vertex without intravenous contrast. COMPARISON:  October 27, 2014 FINDINGS: Brain: Moderate diffuse atrophy is stable. There is no intracranial mass, hemorrhage, extra-axial fluid collection, or midline shift. There is patchy small vessel disease in the centra semiovale bilaterally. There is a tiny lacunar infarct in the left thalamus. No acute appearing infarct evident. Vascular: No hyperdense vessel evident. There is calcification in each carotid siphon region. Skull: Bony calvarium appears intact. Sinuses/Orbits: There is mucosal thickening and opacification involving multiple ethmoid air cells. Other visualized paranasal sinuses are clear. Orbits appear symmetric bilaterally. Other: Mastoid air cells are  clear. There is debris in each external auditory canal. IMPRESSION: 1. Atrophy with patchy periventricular small vessel disease. Tiny prior lacunar infarct left thalamus. No acute infarct evident. No mass or hemorrhage. 2.  Foci of arterial vascular calcification noted. 3.  Ethmoid sinus disease bilaterally. 4.  Probable cerumen in each external auditory canal. Electronically Signed   By: Lowella Grip III M.D.   On: 10/08/2017 15:50   Dg Chest Port 1 View  Result Date: 10/08/2017 CLINICAL DATA:  Fever and cough EXAM: PORTABLE CHEST 1 VIEW COMPARISON:  11/26/2016 FINDINGS: Normal heart size. Stable aortic and hilar contours with atherosclerotic calcification. Large lung volumes  and chronic interstitial coarsening. No focal pneumonia. No effusion or pneumothorax. IMPRESSION: 1. No focal opacity to suggest pneumonia. 2. Large lung volumes, suspect COPD. Electronically Signed   By: Monte Fantasia M.D.   On: 10/08/2017 15:52       Assessment & Plan:   Problem List Items Addressed This Visit    Change in mental status    Noticed some changes over the last 2 months.  Check urine to confirm no infection.  Also check metabolic panel, Y70 and routine labs.  Have him follow up with neurology.  Continue aricept.       Relevant Orders   Urinalysis, Routine w reflex microscopic (Completed)   Urine Culture (Completed)   Vitamin B12 (Completed)   Ambulatory referral to Neurology   Essential hypertension    Blood pressure under good control.  Continue same medication regimen.  Follow pressures.  Follow metabolic panel.        Relevant Orders   CBC with Differential/Platelet (Completed)   TSH (Completed)   Basic metabolic panel (Completed)   Hypercholesterolemia    Follow lipid panel.       Relevant Orders   Hepatic function panel (Completed)   Lipid panel (Completed)   Thrombocytopenia (HCC)    Follow cbc.        Other Visit Diagnoses    Encounter for immunization       Relevant Orders     Flu vaccine HIGH DOSE PF (Completed)       Einar Pheasant, MD

## 2018-07-04 ENCOUNTER — Encounter: Payer: Self-pay | Admitting: *Deleted

## 2018-07-04 LAB — URINE CULTURE
MICRO NUMBER:: 91183699
Result:: NO GROWTH
SPECIMEN QUALITY: ADEQUATE

## 2018-07-05 ENCOUNTER — Encounter: Payer: Self-pay | Admitting: Internal Medicine

## 2018-07-05 NOTE — Assessment & Plan Note (Signed)
Blood pressure under good control.  Continue same medication regimen.  Follow pressures.  Follow metabolic panel.   

## 2018-07-05 NOTE — Assessment & Plan Note (Signed)
Noticed some changes over the last 2 months.  Check urine to confirm no infection.  Also check metabolic panel, V79 and routine labs.  Have him follow up with neurology.  Continue aricept.

## 2018-07-05 NOTE — Assessment & Plan Note (Signed)
Follow cbc.  

## 2018-07-05 NOTE — Assessment & Plan Note (Signed)
Follow lipid panel.   

## 2018-07-05 NOTE — Addendum Note (Signed)
Addended by: Alisa Graff on: 07/05/2018 08:57 PM   Modules accepted: Orders

## 2018-08-15 ENCOUNTER — Telehealth: Payer: Self-pay
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
## 2018-08-18 ENCOUNTER — Encounter: Payer: Self-pay | Admitting: Internal Medicine

## 2018-08-18 ENCOUNTER — Ambulatory Visit (INDEPENDENT_AMBULATORY_CARE_PROVIDER_SITE_OTHER): Payer: Medicare PPO | Admitting: Internal Medicine

## 2018-08-18 DIAGNOSIS — E7800 Pure hypercholesterolemia, unspecified: Secondary | ICD-10-CM

## 2018-08-18 DIAGNOSIS — I714 Abdominal aortic aneurysm, without rupture: Secondary | ICD-10-CM

## 2018-08-18 DIAGNOSIS — D696 Thrombocytopenia, unspecified: Secondary | ICD-10-CM

## 2018-08-18 DIAGNOSIS — I7143 Infrarenal abdominal aortic aneurysm, without rupture: Secondary | ICD-10-CM

## 2018-08-18 DIAGNOSIS — K8689 Other specified diseases of pancreas: Secondary | ICD-10-CM | POA: Diagnosis not present

## 2018-08-18 DIAGNOSIS — I10 Essential (primary) hypertension: Secondary | ICD-10-CM | POA: Diagnosis not present

## 2018-08-18 DIAGNOSIS — R413 Other amnesia: Secondary | ICD-10-CM | POA: Diagnosis not present

## 2018-08-18 DIAGNOSIS — Z8546 Personal history of malignant neoplasm of prostate: Secondary | ICD-10-CM | POA: Diagnosis not present

## 2018-08-18 NOTE — Progress Notes (Signed)
Patient ID: Carlos Clark., male   DOB: 18-Dec-1925, 84 y.o.   MRN: [id number]   Subjective:    Patient ID: Carlos Clark., male    DOB: 1926-08-11, 84 y.o.   MRN: [id number]  HPI  Patient here for as a work in appt. accompanied by his daughter.  History obtained from both of them.  He is doing well.  Feels good.  No pain.  No chest pain.  No sob.  No acid reflux.  No abdominal pain.  Bowels moving.  Some urinary frequency and urgency. Concerned not emptying bladder fully.  Daughter lives in [place].  Wants to move her parents to [place].  States would be followed by Dr Leeanne Deed.  Had questions about stopping medications.  Discussed the need to continue.  Discussed the need for supervision.  He and his wife currently reside in an assisted living facility.  Would need same level of care.  Also would need someone to oversee medications - to make sure taking correctly.  States she has already arranged for these things.  Discussed other medical issues and need for f/u.     Past Medical History:  Diagnosis Date  . Cancer ([place]) 11/28/10   Prostate  . Hypertension    Past Surgical History:  Procedure Laterality Date  . TONSILLECTOMY  1930's  . TOTAL KNEE ARTHROPLASTY     right   Family History  Problem Relation Age of Onset  . Prostate cancer Father   . Lung cancer Mother   . Cancer Daughter   . Stroke Daughter   . Heart attack Daughter    Social History   Socioeconomic History  . Marital status: Married    Spouse name: Not on file  . Number of children: Not on file  . Years of education: Not on file  . Highest education level: Not on file  Occupational History  . Not on file  Social Needs  . Financial resource strain: Not on file  . Food insecurity:    Worry: Not on file    Inability: Not on file  . Transportation needs:    Medical: Not on file    Non-medical: Not on file  Tobacco Use  . Smoking status: Former Research scientist (life sciences)  . Smokeless  tobacco: Never Used  . Tobacco comment: QUIT SMOKING IN THE EARLY 50'S  Substance and Sexual Activity  . Alcohol use: No    Alcohol/week: 0.0 standard drinks  . Drug use: No  . Sexual activity: Not on file  Lifestyle  . Physical activity:    Days per week: Not on file    Minutes per session: Not on file  . Stress: Not on file  Relationships  . Social connections:    Talks on phone: Not on file    Gets together: Not on file    Attends religious service: Not on file    Active member of club or organization: Not on file    Attends meetings of clubs or organizations: Not on file    Relationship status: Not on file  Other Topics Concern  . Not on file  Social History Narrative  . Not on file    Outpatient Encounter Medications as of 08/18/2018  Medication Sig  . donepezil (ARICEPT) 5 MG tablet TAKE 1 TABLET BY MOUTH ONCE DAILY AT BEDTIME  . lisinopril (PRINIVIL,ZESTRIL) 2.5 MG tablet TAKE 1 TABLET BY MOUTH ONCE PER DAY   No facility-administered encounter medications on file as of  08/18/2018.     Review of Systems  Constitutional: Negative for appetite change and unexpected weight change.  HENT: Negative for congestion and sinus pressure.   Respiratory: Negative for cough, chest tightness and shortness of breath.   Cardiovascular: Negative for chest pain, palpitations and leg swelling.  Gastrointestinal: Negative for abdominal pain, diarrhea, nausea and vomiting.  Genitourinary: Positive for frequency and urgency. Negative for difficulty urinating and dysuria.  Musculoskeletal: Negative for joint swelling and myalgias.  Skin: Negative for color change and rash.  Neurological: Negative for dizziness, light-headedness and headaches.  Psychiatric/Behavioral: Negative for agitation and dysphoric mood.       Objective:    Physical Exam  Constitutional: He appears well-developed and well-nourished. No distress.  HENT:  Nose: Nose normal.  Mouth/Throat: Oropharynx is clear  and moist.  Neck: Neck supple.  Cardiovascular: Normal rate and regular rhythm.  Pulmonary/Chest: Effort normal and breath sounds normal. No respiratory distress.  Abdominal: Soft. Bowel sounds are normal. There is no tenderness.  Musculoskeletal: He exhibits no edema or tenderness.  Lymphadenopathy:    He has no cervical adenopathy.  Skin: No rash noted. No erythema.  Psychiatric: He has a normal mood and affect. His behavior is normal.    BP 108/62 (BP Location: Left Arm, Patient Position: Sitting, Cuff Size: Normal)   Pulse 63   Temp 97.6 F (36.4 C) (Oral)   Resp 18   Wt 172 lb 3.2 oz (78.1 kg)   SpO2 98%   BMI 22.72 kg/m  Wt Readings from Last 3 Encounters:  08/18/18 172 lb 3.2 oz (78.1 kg)  07/02/18 171 lb 9.6 oz (77.8 kg)  12/04/17 172 lb 3.2 oz (78.1 kg)     Lab Results  Component Value Date   WBC 6.4 07/02/2018   HGB 15.4 07/02/2018   HCT 44.6 07/02/2018   PLT 177.0 07/02/2018   GLUCOSE 81 07/02/2018   CHOL 162 07/02/2018   TRIG 178.0 (H) 07/02/2018   HDL 32.70 (L) 07/02/2018   LDLCALC 93 07/02/2018   ALT 10 07/02/2018   AST 15 07/02/2018   NA 141 07/02/2018   K 4.0 07/02/2018   CL 104 07/02/2018   CREATININE 0.87 07/02/2018   BUN 20 07/02/2018   CO2 28 07/02/2018   TSH 2.87 07/02/2018   PSA 5.58 01/21/2006   INR 1.1 10/26/2014   HGBA1C 5.5 03/15/2017    Ct Head Wo Contrast  Result Date: 10/08/2017 CLINICAL DATA:  Syncope with questionable fall EXAM: CT HEAD WITHOUT CONTRAST TECHNIQUE: Contiguous axial images were obtained from the base of the skull through the vertex without intravenous contrast. COMPARISON:  October 27, 2014 FINDINGS: Brain: Moderate diffuse atrophy is stable. There is no intracranial mass, hemorrhage, extra-axial fluid collection, or midline shift. There is patchy small vessel disease in the centra semiovale bilaterally. There is a tiny lacunar infarct in the left thalamus. No acute appearing infarct evident. Vascular: No hyperdense  vessel evident. There is calcification in each carotid siphon region. Skull: Bony calvarium appears intact. Sinuses/Orbits: There is mucosal thickening and opacification involving multiple ethmoid air cells. Other visualized paranasal sinuses are clear. Orbits appear symmetric bilaterally. Other: Mastoid air cells are clear. There is debris in each external auditory canal. IMPRESSION: 1. Atrophy with patchy periventricular small vessel disease. Tiny prior lacunar infarct left thalamus. No acute infarct evident. No mass or hemorrhage. 2.  Foci of arterial vascular calcification noted. 3.  Ethmoid sinus disease bilaterally. 4.  Probable cerumen in each external auditory canal. Electronically  Signed   By: Lowella Grip III M.D.   On: 10/08/2017 15:50   Dg Chest Port 1 View  Result Date: 10/08/2017 CLINICAL DATA:  Fever and cough EXAM: PORTABLE CHEST 1 VIEW COMPARISON:  11/26/2016 FINDINGS: Normal heart size. Stable aortic and hilar contours with atherosclerotic calcification. Large lung volumes and chronic interstitial coarsening. No focal pneumonia. No effusion or pneumothorax. IMPRESSION: 1. No focal opacity to suggest pneumonia. 2. Large lung volumes, suspect COPD. Electronically Signed   By: Monte Fantasia M.D.   On: 10/08/2017 15:52       Assessment & Plan:   Problem List Items Addressed This Visit    Aneurysm of infrarenal abdominal aorta ([place])    Found on previous CT.  Was scheduled appt with vascular surgery for f/u.  Has not kept appt previously.  Had been reluctant for further tests.  Will need f/u.        Essential hypertension    Blood pressure under good control.  Continue same medication regimen.  Follow pressures.  Follow metabolic panel.        History of prostate cancer    Previously followed by Dr Jacqlyn Larsen (urology).       Hypercholesterolemia    Follow lipid panel.  Not on cholesterol medication.       Memory change    Saw neurology.  Started on aricept.  Doing better  since in the facility.  Continue.  Eating well.        Pancreatic mass    Noted on previous CT scan.  No nausea or vomiting. Eating.  Will need f/u.        Thrombocytopenia ([place])    07/02/18 platelet count wnl.           I spent 40 minutes with the patient and more than 50% of the time was spent in consultation regarding the above.  Time spent discussing concern concerns and issues.  Time also spent discussing further recommendations for evaluation and treatment and plans for move.     Einar Pheasant, MD

## 2018-08-19 ENCOUNTER — Telehealth: Payer: Self-pay | Admitting: Internal Medicine

## 2018-08-19 DIAGNOSIS — R339 Retention of urine, unspecified: Secondary | ICD-10-CM

## 2018-08-19 DIAGNOSIS — R350 Frequency of micturition: Secondary | ICD-10-CM

## 2018-08-19 NOTE — Telephone Encounter (Signed)
I have placed the order and made note to be done asap.  If acute change in symptoms, will need to be evaluated.

## 2018-08-19 NOTE — Telephone Encounter (Signed)
Daughter came in today with out the patient requesting that he be seen by urology sooner. Explained to her yesterday while patient was in the office that it will take a few days to have everything processed for referral?

## 2018-08-19 NOTE — Telephone Encounter (Signed)
Pt's wife came in and said he is now having pain and is stopped up and then about 10 mins later he wets himself. She thinks he needs to get in with Urologists sooner. He can't leave house.

## 2018-08-21 NOTE — Telephone Encounter (Signed)
Melissa called daughter and left message for her to call [facility] to move appt up with Dr Bernardo Heater.

## 2018-08-24 ENCOUNTER — Encounter: Payer: Self-pay | Admitting: Internal Medicine

## 2018-08-24 NOTE — Assessment & Plan Note (Signed)
Blood pressure under good control.  Continue same medication regimen.  Follow pressures.  Follow metabolic panel.   

## 2018-08-24 NOTE — Assessment & Plan Note (Signed)
Found on previous CT.  Was scheduled appt with vascular surgery for f/u.  Has not kept appt previously.  Had been reluctant for further tests.  Will need f/u.

## 2018-08-24 NOTE — Assessment & Plan Note (Signed)
Follow lipid panel.  Not on cholesterol medication.

## 2018-08-24 NOTE — Assessment & Plan Note (Signed)
Noted on previous CT scan.  No nausea or vomiting. Eating.  Will need f/u.

## 2018-08-24 NOTE — Assessment & Plan Note (Signed)
07/02/18 platelet count wnl.

## 2018-08-24 NOTE — Assessment & Plan Note (Signed)
Saw neurology.  Started on aricept.  Doing better since in the facility.  Continue.  Eating well.

## 2018-08-24 NOTE — Assessment & Plan Note (Signed)
Previously followed by Dr Jacqlyn Larsen (urology).

## 2018-08-27 ENCOUNTER — Encounter: Payer: Self-pay | Admitting: Internal Medicine

## 2018-08-27 DIAGNOSIS — G479 Sleep disorder, unspecified: Secondary | ICD-10-CM | POA: Diagnosis not present

## 2018-08-27 DIAGNOSIS — R350 Frequency of micturition: Secondary | ICD-10-CM | POA: Diagnosis not present

## 2018-08-27 DIAGNOSIS — F419 Anxiety disorder, unspecified: Secondary | ICD-10-CM | POA: Diagnosis not present

## 2018-08-27 DIAGNOSIS — R3989 Other symptoms and signs involving the genitourinary system: Secondary | ICD-10-CM | POA: Diagnosis not present

## 2018-08-29 ENCOUNTER — Telehealth: Payer: Self-pay | Admitting: Internal Medicine

## 2018-08-29 NOTE — Telephone Encounter (Signed)
Copied from [place] [id number]. Topic: Quick Communication - See Telephone Encounter >> Aug 29, 2018 10:39 AM Blase Mess A wrote: CRM for notification. See Telephone encounter for: 08/29/18. Linda, [place] is calling for the results of the UA from 07/04/18 Wanting to know if the patient has a UTI Please advise [phone]

## 2018-09-01 NOTE — Telephone Encounter (Signed)
Called [facility] to let them know that we do not have the results as of now, called [facility] and requested results be faxed over to our office.

## 2018-09-01 NOTE — Telephone Encounter (Signed)
UA results received. Given to Dr. Nicki Reaper for review.

## 2018-09-01 NOTE — Telephone Encounter (Unsigned)
Copied from [place] [id number]. Topic: Quick Communication - See Telephone Encounter >> Aug 29, 2018 10:39 AM Blase Mess A wrote: CRM for notification. See Telephone encounter for: 08/29/18. [place] is calling for the results of the UA from 07/04/18 Wanting to know if the patient has a UTI Please advise [phone] >> Sep 01, 2018  9:29 AM Alfredia Ferguson R wrote: Carlos Hayden is calling in wanting to know if the results have been received for her fathers UA  CB# [phone]

## 2018-09-01 NOTE — Telephone Encounter (Signed)
Faxed script to [facility]. Vaughan Basta is aware

## 2018-09-01 NOTE — Telephone Encounter (Signed)
Reviewed.  Placed in box.  Positive UTI.  Confirm nkda.  If no, then macrobid 100mg  bid x 1 week.

## 2018-09-17 ENCOUNTER — Other Ambulatory Visit: Payer: Self-pay | Admitting: Internal Medicine

## 2018-09-18 ENCOUNTER — Ambulatory Visit: Payer: Self-pay | Admitting: Urology

## 2018-09-18 ENCOUNTER — Encounter: Payer: Self-pay | Admitting: Urology

## 2018-12-04 ENCOUNTER — Telehealth: Payer: Self-pay

## 2018-12-04 NOTE — Telephone Encounter (Signed)
Spoke with Vaughan Basta at [facility] and scheduled appt for tomorrow

## 2018-12-04 NOTE — Telephone Encounter (Signed)
Received fax from [facility] about patient having increased agitation and being combative with staff. Spoke with Vaughan Basta. Over the last 2 months, the patient has gradually gotten more confused. He has been combative with staff and verbally aggressive with wife. Vaughan Basta stated no other symptoms. He is fine when he is left alone but when they are trying to care for him or do things for him and his wife in the room he gets agitated and aggressive. Vaughan Basta stated that they have tried to reach out to their daughter but cannot get her to answer the phone and has not been there in 2 weeks. He did have a fall yesterday. Vaughan Basta confirmed that there were no injuries and he did not hit his head. He is still taking all of his medication and his appetite varies. Some days he eats well but others they cannot get him to eat. He has lost about 5 pounds within the last month. Vaughan Basta was wondering if there was something that we could do or give him to help with the agitation.

## 2018-12-04 NOTE — Telephone Encounter (Signed)
If increased agitation and falls, needs to be evaluated.  Need to confirm nothing more acute going on.  He has seen neurology previously.  Was started on aricept, but if acute change and more combative, needs to be evaluated to confirm no infection, etc.

## 2018-12-05 ENCOUNTER — Ambulatory Visit (INDEPENDENT_AMBULATORY_CARE_PROVIDER_SITE_OTHER): Payer: Medicare PPO | Admitting: Family Medicine

## 2018-12-05 ENCOUNTER — Encounter: Payer: Self-pay | Admitting: Family Medicine

## 2018-12-05 VITALS — BP 110/58 | HR 55 | Temp 97.5°F | Ht 73.0 in | Wt 169.2 lb

## 2018-12-05 DIAGNOSIS — R413 Other amnesia: Secondary | ICD-10-CM | POA: Diagnosis not present

## 2018-12-05 NOTE — Patient Instructions (Signed)
We'll contact you with your lab report.  Don't change your meds for now. I'll update Dr. Nicki Reaper.

## 2018-12-05 NOTE — Progress Notes (Signed)
I have never seen this patient before today.  History reviewed.  He is already on donepezil.  No recent med changes.  He is in [facility], he has been in memory care for about 1 year.  More agitation in the last ~2 months.  More verbally aggressive, he is less compliant and more agitated.  This is not an acute change, it has reportedly been going on for the last few months.  No fevers, not SOB.  No pain.  No new meds.  [place] recently but no injury, this appeared to be a single event.  He did not hit his head.  No known dysuria.  No specific complaints from the patient.    He wanted to know why he was being checked out if I was associated with any federal Event organiser agencies.  I told him that I was a family doctor and he was here for a medical exam.  At the office visit with his wife and with staff from the facility who provide history.  PMH and SH reviewed  ROS: Per HPI unless specifically indicated in ROS section   Meds, vitals, and allergies reviewed.   GEN: nad, alert but not oriented.  He does not know the year.  He is pleasant in conversation. HEENT: mucous membranes moist, OP wnl NECK: supple w/o LA CV: rrr. PULM: ctab, no inc wob ABD: soft, +bs, not tender to palpation EXT: no edema SKIN: no acute rash Moves all extremities x4.  Can follow basic commands.  He is not combative or aggressive at the office visit.

## 2018-12-06 LAB — CBC WITH DIFFERENTIAL/PLATELET
Absolute Monocytes: 528 cells/uL (ref 200–950)
Basophils Absolute: 48 cells/uL (ref 0–200)
Basophils Relative: 0.8 %
EOS PCT: 5.5 %
Eosinophils Absolute: 330 cells/uL (ref 15–500)
HCT: 41.4 % (ref 38.5–50.0)
Hemoglobin: 14.7 g/dL (ref 13.2–17.1)
Lymphs Abs: 1470 cells/uL (ref 850–3900)
MCH: 33 pg (ref 27.0–33.0)
MCHC: 35.5 g/dL (ref 32.0–36.0)
MCV: 93 fL (ref 80.0–100.0)
MPV: 11 fL (ref 7.5–12.5)
Monocytes Relative: 8.8 %
NEUTROS PCT: 60.4 %
Neutro Abs: 3624 cells/uL (ref 1500–7800)
PLATELETS: 165 10*3/uL (ref 140–400)
RBC: 4.45 10*6/uL (ref 4.20–5.80)
RDW: 12.2 % (ref 11.0–15.0)
TOTAL LYMPHOCYTE: 24.5 %
WBC: 6 10*3/uL (ref 3.8–10.8)

## 2018-12-06 LAB — TSH: TSH: 3.1 m[IU]/L (ref 0.40–4.50)

## 2018-12-06 LAB — COMPREHENSIVE METABOLIC PANEL
AG Ratio: 1.7 (calc) (ref 1.0–2.5)
ALT: 9 U/L (ref 9–46)
AST: 12 U/L (ref 10–35)
Albumin: 3.8 g/dL (ref 3.6–5.1)
Alkaline phosphatase (APISO): 63 U/L (ref 35–144)
BUN / CREAT RATIO: 42 (calc) — AB (ref 6–22)
BUN: 31 mg/dL — AB (ref 7–25)
CALCIUM: 8.6 mg/dL (ref 8.6–10.3)
CO2: 25 mmol/L (ref 20–32)
Chloride: 104 mmol/L (ref 98–110)
Creat: 0.74 mg/dL (ref 0.70–1.11)
GLUCOSE: 93 mg/dL (ref 65–99)
Globulin: 2.2 g/dL (calc) (ref 1.9–3.7)
Potassium: 4.3 mmol/L (ref 3.5–5.3)
SODIUM: 140 mmol/L (ref 135–146)
TOTAL PROTEIN: 6 g/dL — AB (ref 6.1–8.1)
Total Bilirubin: 0.4 mg/dL (ref 0.2–1.2)

## 2018-12-06 LAB — VITAMIN B12: Vitamin B-12: 314 pg/mL (ref 200–1100)

## 2018-12-07 ENCOUNTER — Encounter: Payer: Self-pay | Admitting: Family Medicine

## 2018-12-07 NOTE — Assessment & Plan Note (Signed)
He has known memory loss previously treated with donepezil.  No acute changes in his status but gradually worsening agitation and compliance in the last few months.  There is no obvious focal change on physical exam, no rales, no pain in the abdomen, etc.  We talked about the rationale for evaluation and plan at this point.  I think it makes sense to check basic labs at this point.  I will review those, provide input and then route back to the primary for her input.  I appreciate the help of all involved.  >25 minutes spent in face to face time with patient, >50% spent in counselling or coordination of care

## 2018-12-09 ENCOUNTER — Telehealth: Payer: Self-pay | Admitting: Family Medicine

## 2018-12-09 NOTE — Telephone Encounter (Signed)
I phoned the daughter back and only got her VM again.  I reiterated my message from earlier and explained why we had seen her father last Friday rather than Dr. Nicki Reaper.  I hope this answers her questions.

## 2018-12-09 NOTE — Telephone Encounter (Signed)
Pt's daughter Tharon Aquas is calling to speak to nurse concerning her father.

## 2018-12-10 ENCOUNTER — Telehealth: Payer: Self-pay

## 2018-12-10 NOTE — Telephone Encounter (Signed)
Patient has been scheduled for an acute with Lauren on Friday. Advised that Dr. Nicki Reaper does not have a MWF appt until March 30 and [facility] does not want to wait. They were wanting something called in for his agitation, advised he will need to be evaluated.

## 2018-12-10 NOTE — Telephone Encounter (Signed)
Copied from [place] [id number]. Topic: Appointment Scheduling - Scheduling Inquiry for Clinic >> Dec 10, 2018 11:31 AM Margot Ables wrote: Reason for CRM: Dementia progression per [facility] w/[facility] [place]. She is requesting an appt on Monday, Wednesday, or Friday in the morning after 9:30am. This is when the pt does best. In the afternoons pt dementia gets him agitated. Please advise on getting pt in for appt relatively soon. Anderson Malta noted pt was negative for UTI.

## 2018-12-12 ENCOUNTER — Other Ambulatory Visit: Payer: Self-pay | Admitting: Internal Medicine

## 2018-12-12 ENCOUNTER — Telehealth: Payer: Self-pay | Admitting: Lab

## 2018-12-12 ENCOUNTER — Ambulatory Visit: Payer: Medicare PPO | Admitting: Family Medicine

## 2018-12-12 MED ORDER — QUETIAPINE FUMARATE 25 MG PO TABS: 25.0000 mg | ORAL_TABLET | Freq: Every day | ORAL | 1 refills | Status: DC

## 2018-12-12 NOTE — Progress Notes (Signed)
rx sent in for seroquel 25mg  q hs #30 with one refill.

## 2018-12-12 NOTE — Telephone Encounter (Signed)
Ideally I would like to evaluate him, but if concern regarding coming in to the office, we can try seroquel 25mg  q hs and see if this helps.  Also, can they collect a urine and bring in to the office.  See me if questions.

## 2018-12-12 NOTE — Telephone Encounter (Signed)
I spoke with Vaughan Basta at [facility] and clarified that we had changed appt from 3/13 with Lauren to 3/16 with Dr. Nicki Reaper. [facility] is okay with appointment but is wondering now if you feel that it is safe for pt to come in to office since he does live in a facility. Advised that we have not changed anything with our schedule as of now and you were wanting to see him, but I will forward message to you.

## 2018-12-12 NOTE — Telephone Encounter (Signed)
Called [facility] to see if Pt could be reschedule due to having other  Pt's  Coming  Into our practice  with fevers and possible Corona Virus.  I spoke to [employer] of the facility. Cathren Laine agrees on cancelling his appt for today, and rescheduling. She would like to know if something could be prescribed for the Pt to take the edge off, The Pt has been agitated and violent she stated.

## 2018-12-12 NOTE — Telephone Encounter (Signed)
I will forward to Dr Nicki Reaper due to not knowing this patient well  Had recent labs with Doctor at [place] on 12/05/2018

## 2018-12-12 NOTE — Telephone Encounter (Signed)
rx sent in for seroquel 

## 2018-12-12 NOTE — Telephone Encounter (Signed)
The facility has canceled all activities and transport. They are not allowing visitors. I have advised Carlos Hayden to start seroquel 25 mg q hs and monitor. Advised to call with update. If pt starts having increased sedation or if medication does not work, advised to let us know. Pharmacy is [facility]. I have written order to send to [facility]

## 2018-12-15 ENCOUNTER — Ambulatory Visit: Payer: Medicare PPO | Admitting: Internal Medicine

## 2018-12-17 ENCOUNTER — Telehealth: Payer: Self-pay | Admitting: *Deleted

## 2018-12-17 NOTE — Telephone Encounter (Signed)
Copied from [place] [id number]. Topic: General - Other >> Dec 17, 2018 12:08 PM Leward Quan A wrote: Reason for CRM: [facility] department called to ask for a medical assistant to answer some clinical questions. Can be reached at Ph# [phone] provide Ref# [id number]

## 2018-12-17 NOTE — Telephone Encounter (Signed)
PA sent to plan and Anderson Malta is aware

## 2018-12-17 NOTE — Telephone Encounter (Signed)
Spoke with [facility] and gave additional info needed. PA resubmitted and should have an outcome in 24 hours

## 2018-12-17 NOTE — Telephone Encounter (Signed)
Anderson Malta with [facility] calling and states that she spoke with the pharmacy and they cannot fill the prescription because patient's insurance is needing a PA. Please advise. Would like this done asap due to behaviors of patient

## 2018-12-29 DIAGNOSIS — F419 Anxiety disorder, unspecified: Secondary | ICD-10-CM | POA: Diagnosis not present

## 2018-12-29 DIAGNOSIS — Z66 Do not resuscitate: Secondary | ICD-10-CM | POA: Diagnosis not present

## 2018-12-29 DIAGNOSIS — G309 Alzheimer's disease, unspecified: Secondary | ICD-10-CM | POA: Diagnosis not present

## 2018-12-29 DIAGNOSIS — I10 Essential (primary) hypertension: Secondary | ICD-10-CM | POA: Diagnosis not present

## 2019-01-01 DIAGNOSIS — Z79899 Other long term (current) drug therapy: Secondary | ICD-10-CM | POA: Diagnosis not present

## 2019-01-02 ENCOUNTER — Ambulatory Visit: Payer: Medicare PPO | Admitting: Internal Medicine

## 2019-01-12 DIAGNOSIS — D696 Thrombocytopenia, unspecified: Secondary | ICD-10-CM | POA: Diagnosis not present

## 2019-01-12 DIAGNOSIS — R2681 Unsteadiness on feet: Secondary | ICD-10-CM | POA: Diagnosis not present

## 2019-01-12 DIAGNOSIS — I10 Essential (primary) hypertension: Secondary | ICD-10-CM | POA: Diagnosis not present

## 2019-01-12 DIAGNOSIS — G309 Alzheimer's disease, unspecified: Secondary | ICD-10-CM | POA: Diagnosis not present

## 2019-01-12 DIAGNOSIS — E538 Deficiency of other specified B group vitamins: Secondary | ICD-10-CM | POA: Diagnosis not present

## 2019-01-12 DIAGNOSIS — F419 Anxiety disorder, unspecified: Secondary | ICD-10-CM | POA: Diagnosis not present

## 2019-01-16 DIAGNOSIS — Z79899 Other long term (current) drug therapy: Secondary | ICD-10-CM | POA: Diagnosis not present

## 2019-01-16 DIAGNOSIS — R4182 Altered mental status, unspecified: Secondary | ICD-10-CM | POA: Diagnosis not present

## 2019-01-19 DIAGNOSIS — R2681 Unsteadiness on feet: Secondary | ICD-10-CM | POA: Diagnosis not present

## 2019-01-19 DIAGNOSIS — G301 Alzheimer's disease with late onset: Secondary | ICD-10-CM | POA: Diagnosis not present

## 2019-01-19 DIAGNOSIS — F419 Anxiety disorder, unspecified: Secondary | ICD-10-CM | POA: Diagnosis not present

## 2019-01-19 DIAGNOSIS — I10 Essential (primary) hypertension: Secondary | ICD-10-CM | POA: Diagnosis not present

## 2019-01-19 DIAGNOSIS — R634 Abnormal weight loss: Secondary | ICD-10-CM | POA: Diagnosis not present

## 2019-01-26 DIAGNOSIS — I10 Essential (primary) hypertension: Secondary | ICD-10-CM | POA: Diagnosis not present

## 2019-01-26 DIAGNOSIS — F419 Anxiety disorder, unspecified: Secondary | ICD-10-CM | POA: Diagnosis not present

## 2019-01-26 DIAGNOSIS — G301 Alzheimer's disease with late onset: Secondary | ICD-10-CM | POA: Diagnosis not present

## 2019-02-02 DIAGNOSIS — G301 Alzheimer's disease with late onset: Secondary | ICD-10-CM | POA: Diagnosis not present

## 2019-02-02 DIAGNOSIS — R4182 Altered mental status, unspecified: Secondary | ICD-10-CM | POA: Diagnosis not present

## 2019-02-02 DIAGNOSIS — F419 Anxiety disorder, unspecified: Secondary | ICD-10-CM | POA: Diagnosis not present

## 2019-02-09 DIAGNOSIS — R2681 Unsteadiness on feet: Secondary | ICD-10-CM | POA: Diagnosis not present

## 2019-02-09 DIAGNOSIS — I10 Essential (primary) hypertension: Secondary | ICD-10-CM | POA: Diagnosis not present

## 2019-02-09 DIAGNOSIS — R001 Bradycardia, unspecified: Secondary | ICD-10-CM | POA: Diagnosis not present

## 2019-02-09 DIAGNOSIS — R0989 Other specified symptoms and signs involving the circulatory and respiratory systems: Secondary | ICD-10-CM | POA: Diagnosis not present

## 2019-02-10 DIAGNOSIS — I10 Essential (primary) hypertension: Secondary | ICD-10-CM | POA: Diagnosis not present

## 2019-02-15 DIAGNOSIS — R451 Restlessness and agitation: Secondary | ICD-10-CM | POA: Diagnosis not present

## 2019-02-16 DIAGNOSIS — G301 Alzheimer's disease with late onset: Secondary | ICD-10-CM | POA: Diagnosis not present

## 2019-02-16 DIAGNOSIS — G4701 Insomnia due to medical condition: Secondary | ICD-10-CM | POA: Diagnosis not present

## 2019-02-16 DIAGNOSIS — R001 Bradycardia, unspecified: Secondary | ICD-10-CM | POA: Diagnosis not present

## 2019-02-16 DIAGNOSIS — F419 Anxiety disorder, unspecified: Secondary | ICD-10-CM | POA: Diagnosis not present

## 2019-02-16 DIAGNOSIS — I10 Essential (primary) hypertension: Secondary | ICD-10-CM | POA: Diagnosis not present

## 2019-02-25 DIAGNOSIS — G301 Alzheimer's disease with late onset: Secondary | ICD-10-CM | POA: Diagnosis not present

## 2019-02-25 DIAGNOSIS — R001 Bradycardia, unspecified: Secondary | ICD-10-CM | POA: Diagnosis not present

## 2019-02-25 DIAGNOSIS — S0081XA Abrasion of other part of head, initial encounter: Secondary | ICD-10-CM | POA: Diagnosis not present

## 2019-02-25 DIAGNOSIS — R2681 Unsteadiness on feet: Secondary | ICD-10-CM | POA: Diagnosis not present

## 2019-02-25 DIAGNOSIS — F419 Anxiety disorder, unspecified: Secondary | ICD-10-CM | POA: Diagnosis not present

## 2019-02-25 DIAGNOSIS — I10 Essential (primary) hypertension: Secondary | ICD-10-CM | POA: Diagnosis not present

## 2019-03-23 DIAGNOSIS — G301 Alzheimer's disease with late onset: Secondary | ICD-10-CM | POA: Diagnosis not present

## 2019-03-23 DIAGNOSIS — R001 Bradycardia, unspecified: Secondary | ICD-10-CM | POA: Diagnosis not present

## 2019-03-23 DIAGNOSIS — F419 Anxiety disorder, unspecified: Secondary | ICD-10-CM | POA: Diagnosis not present

## 2019-03-23 DIAGNOSIS — R2681 Unsteadiness on feet: Secondary | ICD-10-CM | POA: Diagnosis not present

## 2019-03-23 DIAGNOSIS — R42 Dizziness and giddiness: Secondary | ICD-10-CM | POA: Diagnosis not present

## 2019-03-23 DIAGNOSIS — I10 Essential (primary) hypertension: Secondary | ICD-10-CM | POA: Diagnosis not present

## 2019-04-02 ENCOUNTER — Other Ambulatory Visit: Payer: Self-pay | Admitting: Internal Medicine

## 2019-04-13 DIAGNOSIS — F05 Delirium due to known physiological condition: Secondary | ICD-10-CM | POA: Diagnosis not present

## 2019-04-13 DIAGNOSIS — R634 Abnormal weight loss: Secondary | ICD-10-CM | POA: Diagnosis not present

## 2019-04-13 DIAGNOSIS — G301 Alzheimer's disease with late onset: Secondary | ICD-10-CM | POA: Diagnosis not present

## 2019-04-13 DIAGNOSIS — I10 Essential (primary) hypertension: Secondary | ICD-10-CM | POA: Diagnosis not present

## 2019-04-13 DIAGNOSIS — R5383 Other fatigue: Secondary | ICD-10-CM | POA: Diagnosis not present

## 2019-04-22 ENCOUNTER — Emergency Department
Admission: EM | Admit: 2019-04-22 | Discharge: 2019-04-23 | Disposition: A | Discharge status: Skilled Nursing Facility | Payer: Medicare PPO | Attending: Emergency Medicine | Admitting: Emergency Medicine

## 2019-04-22 ENCOUNTER — Other Ambulatory Visit: Payer: Self-pay

## 2019-04-22 ENCOUNTER — Encounter: Payer: Self-pay | Admitting: Emergency Medicine

## 2019-04-22 ENCOUNTER — Emergency Department: Payer: Medicare PPO

## 2019-04-22 DIAGNOSIS — Z8546 Personal history of malignant neoplasm of prostate: Secondary | ICD-10-CM | POA: Insufficient documentation

## 2019-04-22 DIAGNOSIS — Y939 Activity, unspecified: Secondary | ICD-10-CM | POA: Insufficient documentation

## 2019-04-22 DIAGNOSIS — N3001 Acute cystitis with hematuria: Secondary | ICD-10-CM | POA: Insufficient documentation

## 2019-04-22 DIAGNOSIS — I10 Essential (primary) hypertension: Secondary | ICD-10-CM | POA: Diagnosis not present

## 2019-04-22 DIAGNOSIS — S0101XA Laceration without foreign body of scalp, initial encounter: Secondary | ICD-10-CM | POA: Diagnosis not present

## 2019-04-22 DIAGNOSIS — Y929 Unspecified place or not applicable: Secondary | ICD-10-CM | POA: Insufficient documentation

## 2019-04-22 DIAGNOSIS — Z87891 Personal history of nicotine dependence: Secondary | ICD-10-CM | POA: Insufficient documentation

## 2019-04-22 DIAGNOSIS — W19XXXA Unspecified fall, initial encounter: Secondary | ICD-10-CM | POA: Diagnosis not present

## 2019-04-22 DIAGNOSIS — S0990XA Unspecified injury of head, initial encounter: Secondary | ICD-10-CM | POA: Diagnosis not present

## 2019-04-22 DIAGNOSIS — Y999 Unspecified external cause status: Secondary | ICD-10-CM | POA: Insufficient documentation

## 2019-04-22 DIAGNOSIS — Z96651 Presence of right artificial knee joint: Secondary | ICD-10-CM | POA: Insufficient documentation

## 2019-04-22 DIAGNOSIS — W010XXA Fall on same level from slipping, tripping and stumbling without subsequent striking against object, initial encounter: Secondary | ICD-10-CM | POA: Diagnosis not present

## 2019-04-22 DIAGNOSIS — S199XXA Unspecified injury of neck, initial encounter: Secondary | ICD-10-CM | POA: Diagnosis not present

## 2019-04-22 DIAGNOSIS — S0003XA Contusion of scalp, initial encounter: Secondary | ICD-10-CM | POA: Diagnosis not present

## 2019-04-22 DIAGNOSIS — R5381 Other malaise: Secondary | ICD-10-CM | POA: Diagnosis not present

## 2019-04-22 DIAGNOSIS — R9431 Abnormal electrocardiogram [ECG] [EKG]: Secondary | ICD-10-CM | POA: Diagnosis not present

## 2019-04-22 LAB — CBC WITH DIFFERENTIAL/PLATELET
Abs Immature Granulocytes: 0.02 10*3/uL (ref 0.00–0.07)
Basophils Absolute: 0 10*3/uL (ref 0.0–0.1)
Basophils Relative: 1 %
Eosinophils Absolute: 0.2 10*3/uL (ref 0.0–0.5)
Eosinophils Relative: 4 %
HCT: 40.7 % (ref 39.0–52.0)
Hemoglobin: 14.1 g/dL (ref 13.0–17.0)
Immature Granulocytes: 0 %
Lymphocytes Relative: 16 %
Lymphs Abs: 1 10*3/uL (ref 0.7–4.0)
MCH: 32.8 pg (ref 26.0–34.0)
MCHC: 34.6 g/dL (ref 30.0–36.0)
MCV: 94.7 fL (ref 80.0–100.0)
Monocytes Absolute: 0.5 10*3/uL (ref 0.1–1.0)
Monocytes Relative: 9 %
Neutro Abs: 4.4 10*3/uL (ref 1.7–7.7)
Neutrophils Relative %: 70 %
Platelets: 153 10*3/uL (ref 150–400)
RBC: 4.3 MIL/uL (ref 4.22–5.81)
RDW: 12.2 % (ref 11.5–15.5)
WBC: 6.3 10*3/uL (ref 4.0–10.5)
nRBC: 0 % (ref 0.0–0.2)

## 2019-04-22 LAB — BASIC METABOLIC PANEL
Anion gap: 6 (ref 5–15)
BUN: 27 mg/dL — ABNORMAL HIGH (ref 8–23)
CO2: 28 mmol/L (ref 22–32)
Calcium: 8.5 mg/dL — ABNORMAL LOW (ref 8.9–10.3)
Chloride: 107 mmol/L (ref 98–111)
Creatinine, Ser: 0.65 mg/dL (ref 0.61–1.24)
GFR calc Af Amer: >60 mL/min (ref >60)
GFR calc non Af Amer: >60 mL/min (ref >60)
Glucose, Bld: 101 mg/dL — ABNORMAL HIGH (ref 70–99)
Potassium: 3.8 mmol/L (ref 3.5–5.1)
Sodium: 141 mmol/L (ref 135–145)

## 2019-04-22 LAB — TROPONIN I (HIGH SENSITIVITY): Troponin I (High Sensitivity): 2 ng/L (ref <18)

## 2019-04-22 NOTE — ED Triage Notes (Signed)
Patient coming from home via [facility] for unwitnessed fall but patient states he got tangled in the rocking chair and fell into the wall. Patient has laceration on right side of head. Patient has hx of dementia but patient is alert and oriented to self, place, situation but not time. Patient not complaining of any pain.

## 2019-04-22 NOTE — ED Provider Notes (Signed)
-----------------------------------------   11:58 PM on 04/22/2019 -----------------------------------------   Blood pressure (!) 147/87, pulse (!) 52, temperature 98.2 F (36.8 C), temperature source Oral, resp. rate 16, height 6\' 2"  (1.88 m), SpO2 99 %.  Assuming care from Dr. Archie Balboa of [facility]. is a 85 y.o. male with a chief complaint of Fall and Laceration .    Please refer to H&P by previous MD for further details.  The current plan of care is to f/u UA.  _________________________ 1:57 AM on 04/23/2019 -----------------------------------------  UA positive for UTI.  No signs of sepsis with no fever, tachycardia, or leukocytosis.  Culture has been sent.  Patient started on Keflex.  Patient will be discharged home with my standard return precautions.        Alfred Levins, [place], MD 04/23/19 0157

## 2019-04-22 NOTE — Discharge Instructions (Addendum)
Please seek medical attention for any high fevers, chest pain, shortness of breath, change in behavior, persistent vomiting, bloody stool or any other new or concerning symptoms.  

## 2019-04-23 LAB — URINALYSIS, COMPLETE (UACMP) WITH MICROSCOPIC
Bilirubin Urine: NEGATIVE
Glucose, UA: NEGATIVE mg/dL
Ketones, ur: NEGATIVE mg/dL
Nitrite: NEGATIVE
Protein, ur: NEGATIVE mg/dL
Specific Gravity, Urine: 1.02 (ref 1.005–1.030)
WBC, UA: >50 WBC/hpf — ABNORMAL HIGH (ref 0–5)
pH: 6 (ref 5.0–8.0)

## 2019-04-23 MED ORDER — CEPHALEXIN 500 MG PO CAPS
500.0000 mg | ORAL_CAPSULE | Freq: Once | ORAL | Status: AC
  Administered 2019-04-23: 500 mg via ORAL
  Filled 2019-04-23: qty 1

## 2019-04-23 MED ORDER — CEPHALEXIN 500 MG PO CAPS: 500.0000 mg | ORAL_CAPSULE | Freq: Three times a day (TID) | ORAL | 0 refills | Status: AC

## 2019-04-23 NOTE — ED Notes (Signed)
[facility] notified with an attempted phone call by nurse and then [facility] the grandson of the patient and son of legal guardian texted his mother to notify her and he is bringing his grandfather back to [facility].

## 2019-04-24 LAB — URINE CULTURE: Culture: NO GROWTH

## 2019-04-27 DIAGNOSIS — Z20828 Contact with and (suspected) exposure to other viral communicable diseases: Secondary | ICD-10-CM | POA: Diagnosis not present

## 2019-04-27 NOTE — ED Provider Notes (Signed)
[facility] Emergency Department Provider Note   ____________________________________________   I have reviewed the triage vital signs and the nursing notes.   HISTORY  Chief Complaint Fall and Laceration   History limited by and level 5 caveat due to: Dementia   HPI Carlos Hayden. is a 85 y.o. male who presents to the emergency department today because of concern for fall and laceration. Patient apparently got tangled in some furniture. Suffered a laceration to the right side of his head. Denies any significant pain. Denies any recent illness.   Records reviewed. Per medical record review patient has a history of HTN  Past Medical History:  Diagnosis Date  . Cancer ([place]) 11/28/10   Prostate  . Hypertension     Patient Active Problem List   Diagnosis Date Noted  . Change in mental status 07/02/2018  . Acute viral syndrome 10/08/2017  . Thrombocytopenia ([place]) 07/07/2017  . Pancreatic mass 07/06/2017  . Aneurysm of infrarenal abdominal aorta (HCC) 07/06/2017  . Memory change 03/15/2017  . Unsteady gait 07/11/2014  . Essential hypertension 05/25/2008  . Hypercholesterolemia 04/14/2008  . History of prostate cancer 04/14/2008  . VASOVAGAL SYNCOPE 08/05/2007    Past Surgical History:  Procedure Laterality Date  . TONSILLECTOMY  1930's  . TOTAL KNEE ARTHROPLASTY     right    Prior to Admission medications   Medication Sig Start Date End Date Taking? Authorizing Provider  donepezil (ARICEPT) 5 MG tablet TAKE 1 TABLET BY MOUTH ONCE DAILY AT BEDTIME Patient taking differently: Take 5 mg by mouth at bedtime.  04/02/19  Yes Einar Pheasant, MD  lisinopril (PRINIVIL,ZESTRIL) 2.5 MG tablet TAKE 1 TABLET BY MOUTH ONCE PER DAY Patient taking differently: Take 2.5 mg by mouth daily. TAKE 1 TABLET BY MOUTH ONCE PER DAY 05/26/18  Yes Einar Pheasant, MD  QUEtiapine (SEROQUEL) 25 MG tablet Take 1 tablet (25 mg total) by mouth at bedtime. 12/12/18   Yes Einar Pheasant, MD  sertraline (ZOLOFT) 50 MG tablet Take 50 mg by mouth daily. 03/30/19  Yes [provider]  cephALEXin (KEFLEX) 500 MG capsule Take 1 capsule (500 mg total) by mouth 3 (three) times daily for 7 days. 04/23/19 04/30/19  Rudene Re, MD    Allergies Patient has no known allergies.  Family History  Problem Relation Age of Onset  . Prostate cancer Father   . Lung cancer Mother   . Cancer Daughter   . Stroke Daughter   . Heart attack Daughter     Social History Social History   Tobacco Use  . Smoking status: Former Research scientist (life sciences)  . Smokeless tobacco: Never Used  . Tobacco comment: QUIT SMOKING IN THE EARLY 50'S  Substance Use Topics  . Alcohol use: No    Alcohol/week: 0.0 standard drinks  . Drug use: No    Review of Systems Constitutional: No fever/chills Eyes: No visual changes. ENT: No sore throat. Cardiovascular: Denies chest pain. Respiratory: Denies shortness of breath. Gastrointestinal: No abdominal pain.  No nausea, no vomiting.  No diarrhea.   Genitourinary: Negative for dysuria. Musculoskeletal: Negative for back pain. Skin: Positive for laceration to right scalp.  Neurological: Negative for headaches, focal weakness or numbness.  ____________________________________________   PHYSICAL EXAM:  VITAL SIGNS: ED Triage Vitals  Enc Vitals Group     BP 04/22/19 1918 (!) 147/87     Pulse Rate 04/22/19 1918 (!) 52     Resp 04/22/19 1918 16     Temp 04/22/19 1918 98.2  F (36.8 C)     Temp Source 04/22/19 1918 Oral     SpO2 04/22/19 1918 99 %     Weight --      Height 04/22/19 1919 6\' 2"  (1.88 m)     Head Circumference --      Peak Flow --      Pain Score 04/22/19 1919 0    Constitutional: Alert and oriented.  Eyes: Conjunctivae are normal.  ENT      Head: Normocephalic. Skin tear type lesion to the right vertex.       Nose: No congestion/rhinnorhea.      Mouth/Throat: Mucous membranes are moist.      Neck: No  stridor. Hematological/Lymphatic/Immunilogical: No cervical lymphadenopathy. Cardiovascular: Normal rate, regular rhythm.  No murmurs, rubs, or gallops.  Respiratory: Normal respiratory effort without tachypnea nor retractions. Breath sounds are clear and equal bilaterally. No wheezes/rales/rhonchi. Gastrointestinal: Soft and non tender. No rebound. No guarding.  Genitourinary: Deferred Musculoskeletal: Normal range of motion in all extremities. No lower extremity edema. Neurologic:  Not completely oriented to events. Moving all extremities.  Skin:  Skin tear to scalp.  ____________________________________________    LABS (pertinent positives/negatives)  Trop hs 2 BMP wnl except glu 101, BUN 27, ca 8.5 CBC wbc 6.3, hgb 14.1, plt 153 UA pending ____________________________________________   EKG  I, Nance Pear, attending physician, personally viewed and interpreted this EKG  EKG Time: 1916 Rate: 55 Rhythm: sinus bradycardia Axis: normal Intervals: qtc 412 QRS: narrow, q waves v1 ST changes: no st elevation Impression: abnormal ekg   ____________________________________________    RADIOLOGY  CT head/cervical spine No intracranial bleed. No fracture.  ____________________________________________   PROCEDURES  Procedures  ____________________________________________   INITIAL IMPRESSION / ASSESSMENT AND PLAN / ED COURSE  Pertinent labs & imaging results that were available during my care of the patient were reviewed by me and considered in my medical decision making (see chart for details).   Patient presented to the emergency department today after a fall. Did suffer a skin tear type lesion to his scalp. This was closed with steristrips. Ct head and cervical spine without concerning traumatic abnormality. Blood work without concerning abnormality. Awaiting urine at time of sign out.    ____________________________________________   FINAL CLINICAL  IMPRESSION(S) / ED DIAGNOSES  Fall Laceration  Note: This dictation was prepared with Dragon dictation. Any transcriptional errors that result from this process are unintentional     Nance Pear, MD 04/27/19 1515

## 2019-04-29 ENCOUNTER — Telehealth: Payer: Self-pay

## 2019-04-29 NOTE — Telephone Encounter (Signed)
Copied from [place] [id number]. Topic: General - Other >> Apr 29, 2019  3:47 PM Leward Quan A wrote: Reason for CRM: Netta Corrigan patients daughter called said she had a missed call no notes in system. Augusto Garbe can be reached at Ph#   [phone]

## 2019-05-05 NOTE — Telephone Encounter (Signed)
I called patient's daughter & stated that I did not see a reason for the call. I asked that she disregard & could call back if anything was needed for her father.

## 2019-05-18 DIAGNOSIS — K5909 Other constipation: Secondary | ICD-10-CM | POA: Diagnosis not present

## 2019-05-18 DIAGNOSIS — L750 Bromhidrosis: Secondary | ICD-10-CM | POA: Diagnosis not present

## 2019-05-18 DIAGNOSIS — R32 Unspecified urinary incontinence: Secondary | ICD-10-CM | POA: Diagnosis not present

## 2019-05-18 DIAGNOSIS — R109 Unspecified abdominal pain: Secondary | ICD-10-CM | POA: Diagnosis not present

## 2019-05-18 DIAGNOSIS — F0390 Unspecified dementia without behavioral disturbance: Secondary | ICD-10-CM | POA: Diagnosis not present

## 2019-05-21 DIAGNOSIS — Z20828 Contact with and (suspected) exposure to other viral communicable diseases: Secondary | ICD-10-CM | POA: Diagnosis not present

## 2019-05-25 DIAGNOSIS — K5900 Constipation, unspecified: Secondary | ICD-10-CM | POA: Diagnosis not present

## 2019-05-25 DIAGNOSIS — R410 Disorientation, unspecified: Secondary | ICD-10-CM | POA: Diagnosis not present

## 2019-05-25 DIAGNOSIS — N390 Urinary tract infection, site not specified: Secondary | ICD-10-CM | POA: Diagnosis not present

## 2019-05-25 DIAGNOSIS — E441 Mild protein-calorie malnutrition: Secondary | ICD-10-CM | POA: Diagnosis not present

## 2019-05-25 DIAGNOSIS — R269 Unspecified abnormalities of gait and mobility: Secondary | ICD-10-CM | POA: Diagnosis not present

## 2019-06-10 DIAGNOSIS — N390 Urinary tract infection, site not specified: Secondary | ICD-10-CM | POA: Diagnosis not present

## 2019-06-10 DIAGNOSIS — R269 Unspecified abnormalities of gait and mobility: Secondary | ICD-10-CM | POA: Diagnosis not present

## 2019-06-10 DIAGNOSIS — E441 Mild protein-calorie malnutrition: Secondary | ICD-10-CM | POA: Diagnosis not present

## 2019-06-10 DIAGNOSIS — R410 Disorientation, unspecified: Secondary | ICD-10-CM | POA: Diagnosis not present

## 2019-06-11 DIAGNOSIS — Z9181 History of falling: Secondary | ICD-10-CM | POA: Diagnosis not present

## 2019-06-11 DIAGNOSIS — E785 Hyperlipidemia, unspecified: Secondary | ICD-10-CM | POA: Diagnosis not present

## 2019-06-11 DIAGNOSIS — F0280 Dementia in other diseases classified elsewhere without behavioral disturbance: Secondary | ICD-10-CM | POA: Diagnosis not present

## 2019-06-11 DIAGNOSIS — I10 Essential (primary) hypertension: Secondary | ICD-10-CM | POA: Diagnosis not present

## 2019-06-11 DIAGNOSIS — Z8546 Personal history of malignant neoplasm of prostate: Secondary | ICD-10-CM | POA: Diagnosis not present

## 2019-06-11 DIAGNOSIS — F411 Generalized anxiety disorder: Secondary | ICD-10-CM | POA: Diagnosis not present

## 2019-06-11 DIAGNOSIS — Z96651 Presence of right artificial knee joint: Secondary | ICD-10-CM | POA: Diagnosis not present

## 2019-06-11 DIAGNOSIS — Z87891 Personal history of nicotine dependence: Secondary | ICD-10-CM | POA: Diagnosis not present

## 2019-06-11 DIAGNOSIS — G301 Alzheimer's disease with late onset: Secondary | ICD-10-CM | POA: Diagnosis not present

## 2019-06-15 DIAGNOSIS — F0280 Dementia in other diseases classified elsewhere without behavioral disturbance: Secondary | ICD-10-CM | POA: Diagnosis not present

## 2019-06-15 DIAGNOSIS — Z9181 History of falling: Secondary | ICD-10-CM | POA: Diagnosis not present

## 2019-06-15 DIAGNOSIS — Z96651 Presence of right artificial knee joint: Secondary | ICD-10-CM | POA: Diagnosis not present

## 2019-06-15 DIAGNOSIS — I10 Essential (primary) hypertension: Secondary | ICD-10-CM | POA: Diagnosis not present

## 2019-06-15 DIAGNOSIS — F411 Generalized anxiety disorder: Secondary | ICD-10-CM | POA: Diagnosis not present

## 2019-06-15 DIAGNOSIS — Z87891 Personal history of nicotine dependence: Secondary | ICD-10-CM | POA: Diagnosis not present

## 2019-06-15 DIAGNOSIS — G301 Alzheimer's disease with late onset: Secondary | ICD-10-CM | POA: Diagnosis not present

## 2019-06-15 DIAGNOSIS — E785 Hyperlipidemia, unspecified: Secondary | ICD-10-CM | POA: Diagnosis not present

## 2019-06-15 DIAGNOSIS — Z8546 Personal history of malignant neoplasm of prostate: Secondary | ICD-10-CM | POA: Diagnosis not present

## 2019-06-16 DIAGNOSIS — E785 Hyperlipidemia, unspecified: Secondary | ICD-10-CM | POA: Diagnosis not present

## 2019-06-16 DIAGNOSIS — Z8546 Personal history of malignant neoplasm of prostate: Secondary | ICD-10-CM | POA: Diagnosis not present

## 2019-06-16 DIAGNOSIS — Z9181 History of falling: Secondary | ICD-10-CM | POA: Diagnosis not present

## 2019-06-16 DIAGNOSIS — Z96651 Presence of right artificial knee joint: Secondary | ICD-10-CM | POA: Diagnosis not present

## 2019-06-16 DIAGNOSIS — F0280 Dementia in other diseases classified elsewhere without behavioral disturbance: Secondary | ICD-10-CM | POA: Diagnosis not present

## 2019-06-16 DIAGNOSIS — Z87891 Personal history of nicotine dependence: Secondary | ICD-10-CM | POA: Diagnosis not present

## 2019-06-16 DIAGNOSIS — I10 Essential (primary) hypertension: Secondary | ICD-10-CM | POA: Diagnosis not present

## 2019-06-16 DIAGNOSIS — F411 Generalized anxiety disorder: Secondary | ICD-10-CM | POA: Diagnosis not present

## 2019-06-16 DIAGNOSIS — G301 Alzheimer's disease with late onset: Secondary | ICD-10-CM | POA: Diagnosis not present

## 2019-06-18 DIAGNOSIS — Z20828 Contact with and (suspected) exposure to other viral communicable diseases: Secondary | ICD-10-CM | POA: Diagnosis not present

## 2019-06-23 DIAGNOSIS — F411 Generalized anxiety disorder: Secondary | ICD-10-CM | POA: Diagnosis not present

## 2019-06-23 DIAGNOSIS — F0280 Dementia in other diseases classified elsewhere without behavioral disturbance: Secondary | ICD-10-CM | POA: Diagnosis not present

## 2019-06-23 DIAGNOSIS — I10 Essential (primary) hypertension: Secondary | ICD-10-CM | POA: Diagnosis not present

## 2019-06-23 DIAGNOSIS — Z9181 History of falling: Secondary | ICD-10-CM | POA: Diagnosis not present

## 2019-06-23 DIAGNOSIS — E785 Hyperlipidemia, unspecified: Secondary | ICD-10-CM | POA: Diagnosis not present

## 2019-06-23 DIAGNOSIS — Z8546 Personal history of malignant neoplasm of prostate: Secondary | ICD-10-CM | POA: Diagnosis not present

## 2019-06-23 DIAGNOSIS — G301 Alzheimer's disease with late onset: Secondary | ICD-10-CM | POA: Diagnosis not present

## 2019-06-23 DIAGNOSIS — Z87891 Personal history of nicotine dependence: Secondary | ICD-10-CM | POA: Diagnosis not present

## 2019-06-23 DIAGNOSIS — Z96651 Presence of right artificial knee joint: Secondary | ICD-10-CM | POA: Diagnosis not present

## 2019-07-01 DIAGNOSIS — I10 Essential (primary) hypertension: Secondary | ICD-10-CM | POA: Diagnosis not present

## 2019-07-01 DIAGNOSIS — F411 Generalized anxiety disorder: Secondary | ICD-10-CM | POA: Diagnosis not present

## 2019-07-01 DIAGNOSIS — Z8546 Personal history of malignant neoplasm of prostate: Secondary | ICD-10-CM | POA: Diagnosis not present

## 2019-07-01 DIAGNOSIS — G301 Alzheimer's disease with late onset: Secondary | ICD-10-CM | POA: Diagnosis not present

## 2019-07-01 DIAGNOSIS — Z87891 Personal history of nicotine dependence: Secondary | ICD-10-CM | POA: Diagnosis not present

## 2019-07-01 DIAGNOSIS — Z96651 Presence of right artificial knee joint: Secondary | ICD-10-CM | POA: Diagnosis not present

## 2019-07-01 DIAGNOSIS — F0280 Dementia in other diseases classified elsewhere without behavioral disturbance: Secondary | ICD-10-CM | POA: Diagnosis not present

## 2019-07-01 DIAGNOSIS — E785 Hyperlipidemia, unspecified: Secondary | ICD-10-CM | POA: Diagnosis not present

## 2019-07-01 DIAGNOSIS — Z9181 History of falling: Secondary | ICD-10-CM | POA: Diagnosis not present

## 2019-07-02 DIAGNOSIS — Z87891 Personal history of nicotine dependence: Secondary | ICD-10-CM | POA: Diagnosis not present

## 2019-07-02 DIAGNOSIS — Z96651 Presence of right artificial knee joint: Secondary | ICD-10-CM | POA: Diagnosis not present

## 2019-07-02 DIAGNOSIS — G301 Alzheimer's disease with late onset: Secondary | ICD-10-CM | POA: Diagnosis not present

## 2019-07-02 DIAGNOSIS — Z9181 History of falling: Secondary | ICD-10-CM | POA: Diagnosis not present

## 2019-07-02 DIAGNOSIS — Z20828 Contact with and (suspected) exposure to other viral communicable diseases: Secondary | ICD-10-CM | POA: Diagnosis not present

## 2019-07-02 DIAGNOSIS — Z8546 Personal history of malignant neoplasm of prostate: Secondary | ICD-10-CM | POA: Diagnosis not present

## 2019-07-02 DIAGNOSIS — E785 Hyperlipidemia, unspecified: Secondary | ICD-10-CM | POA: Diagnosis not present

## 2019-07-02 DIAGNOSIS — F0280 Dementia in other diseases classified elsewhere without behavioral disturbance: Secondary | ICD-10-CM | POA: Diagnosis not present

## 2019-07-02 DIAGNOSIS — I10 Essential (primary) hypertension: Secondary | ICD-10-CM | POA: Diagnosis not present

## 2019-07-02 DIAGNOSIS — F411 Generalized anxiety disorder: Secondary | ICD-10-CM | POA: Diagnosis not present

## 2019-07-08 DIAGNOSIS — Z20828 Contact with and (suspected) exposure to other viral communicable diseases: Secondary | ICD-10-CM | POA: Diagnosis not present

## 2019-07-10 DIAGNOSIS — F0280 Dementia in other diseases classified elsewhere without behavioral disturbance: Secondary | ICD-10-CM | POA: Diagnosis not present

## 2019-07-10 DIAGNOSIS — F411 Generalized anxiety disorder: Secondary | ICD-10-CM | POA: Diagnosis not present

## 2019-07-10 DIAGNOSIS — Z96651 Presence of right artificial knee joint: Secondary | ICD-10-CM | POA: Diagnosis not present

## 2019-07-10 DIAGNOSIS — Z9181 History of falling: Secondary | ICD-10-CM | POA: Diagnosis not present

## 2019-07-10 DIAGNOSIS — E785 Hyperlipidemia, unspecified: Secondary | ICD-10-CM | POA: Diagnosis not present

## 2019-07-10 DIAGNOSIS — Z8546 Personal history of malignant neoplasm of prostate: Secondary | ICD-10-CM | POA: Diagnosis not present

## 2019-07-10 DIAGNOSIS — Z87891 Personal history of nicotine dependence: Secondary | ICD-10-CM | POA: Diagnosis not present

## 2019-07-10 DIAGNOSIS — G301 Alzheimer's disease with late onset: Secondary | ICD-10-CM | POA: Diagnosis not present

## 2019-07-10 DIAGNOSIS — I10 Essential (primary) hypertension: Secondary | ICD-10-CM | POA: Diagnosis not present

## 2019-07-13 DIAGNOSIS — Z87891 Personal history of nicotine dependence: Secondary | ICD-10-CM | POA: Diagnosis not present

## 2019-07-13 DIAGNOSIS — Z8546 Personal history of malignant neoplasm of prostate: Secondary | ICD-10-CM | POA: Diagnosis not present

## 2019-07-13 DIAGNOSIS — E785 Hyperlipidemia, unspecified: Secondary | ICD-10-CM | POA: Diagnosis not present

## 2019-07-13 DIAGNOSIS — F0280 Dementia in other diseases classified elsewhere without behavioral disturbance: Secondary | ICD-10-CM | POA: Diagnosis not present

## 2019-07-13 DIAGNOSIS — F411 Generalized anxiety disorder: Secondary | ICD-10-CM | POA: Diagnosis not present

## 2019-07-13 DIAGNOSIS — Z9181 History of falling: Secondary | ICD-10-CM | POA: Diagnosis not present

## 2019-07-13 DIAGNOSIS — Z96651 Presence of right artificial knee joint: Secondary | ICD-10-CM | POA: Diagnosis not present

## 2019-07-13 DIAGNOSIS — G309 Alzheimer's disease, unspecified: Secondary | ICD-10-CM | POA: Diagnosis not present

## 2019-07-13 DIAGNOSIS — G4701 Insomnia due to medical condition: Secondary | ICD-10-CM | POA: Diagnosis not present

## 2019-07-13 DIAGNOSIS — I10 Essential (primary) hypertension: Secondary | ICD-10-CM | POA: Diagnosis not present

## 2019-07-13 DIAGNOSIS — R634 Abnormal weight loss: Secondary | ICD-10-CM | POA: Diagnosis not present

## 2019-07-13 DIAGNOSIS — R2681 Unsteadiness on feet: Secondary | ICD-10-CM | POA: Diagnosis not present

## 2019-07-13 DIAGNOSIS — G301 Alzheimer's disease with late onset: Secondary | ICD-10-CM | POA: Diagnosis not present

## 2019-07-14 DIAGNOSIS — F411 Generalized anxiety disorder: Secondary | ICD-10-CM | POA: Diagnosis not present

## 2019-07-14 DIAGNOSIS — Z96651 Presence of right artificial knee joint: Secondary | ICD-10-CM | POA: Diagnosis not present

## 2019-07-14 DIAGNOSIS — Z8546 Personal history of malignant neoplasm of prostate: Secondary | ICD-10-CM | POA: Diagnosis not present

## 2019-07-14 DIAGNOSIS — Z87891 Personal history of nicotine dependence: Secondary | ICD-10-CM | POA: Diagnosis not present

## 2019-07-14 DIAGNOSIS — E785 Hyperlipidemia, unspecified: Secondary | ICD-10-CM | POA: Diagnosis not present

## 2019-07-14 DIAGNOSIS — I10 Essential (primary) hypertension: Secondary | ICD-10-CM | POA: Diagnosis not present

## 2019-07-14 DIAGNOSIS — F0280 Dementia in other diseases classified elsewhere without behavioral disturbance: Secondary | ICD-10-CM | POA: Diagnosis not present

## 2019-07-14 DIAGNOSIS — Z9181 History of falling: Secondary | ICD-10-CM | POA: Diagnosis not present

## 2019-07-14 DIAGNOSIS — G301 Alzheimer's disease with late onset: Secondary | ICD-10-CM | POA: Diagnosis not present

## 2019-07-16 DIAGNOSIS — R001 Bradycardia, unspecified: Secondary | ICD-10-CM | POA: Diagnosis not present

## 2019-07-16 DIAGNOSIS — Z79899 Other long term (current) drug therapy: Secondary | ICD-10-CM | POA: Diagnosis not present

## 2019-07-20 DIAGNOSIS — D696 Thrombocytopenia, unspecified: Secondary | ICD-10-CM | POA: Diagnosis not present

## 2019-07-20 DIAGNOSIS — R2681 Unsteadiness on feet: Secondary | ICD-10-CM | POA: Diagnosis not present

## 2019-07-20 DIAGNOSIS — I959 Hypotension, unspecified: Secondary | ICD-10-CM | POA: Diagnosis not present

## 2019-07-20 DIAGNOSIS — R001 Bradycardia, unspecified: Secondary | ICD-10-CM | POA: Diagnosis not present

## 2019-07-20 DIAGNOSIS — G309 Alzheimer's disease, unspecified: Secondary | ICD-10-CM | POA: Diagnosis not present

## 2019-07-20 DIAGNOSIS — Z79899 Other long term (current) drug therapy: Secondary | ICD-10-CM | POA: Diagnosis not present

## 2019-07-20 DIAGNOSIS — R42 Dizziness and giddiness: Secondary | ICD-10-CM | POA: Diagnosis not present

## 2019-07-20 DIAGNOSIS — I10 Essential (primary) hypertension: Secondary | ICD-10-CM | POA: Diagnosis not present

## 2019-07-23 DIAGNOSIS — F0280 Dementia in other diseases classified elsewhere without behavioral disturbance: Secondary | ICD-10-CM | POA: Diagnosis not present

## 2019-07-23 DIAGNOSIS — E785 Hyperlipidemia, unspecified: Secondary | ICD-10-CM | POA: Diagnosis not present

## 2019-07-23 DIAGNOSIS — F411 Generalized anxiety disorder: Secondary | ICD-10-CM | POA: Diagnosis not present

## 2019-07-23 DIAGNOSIS — I10 Essential (primary) hypertension: Secondary | ICD-10-CM | POA: Diagnosis not present

## 2019-07-23 DIAGNOSIS — G301 Alzheimer's disease with late onset: Secondary | ICD-10-CM | POA: Diagnosis not present

## 2019-07-23 DIAGNOSIS — Z8546 Personal history of malignant neoplasm of prostate: Secondary | ICD-10-CM | POA: Diagnosis not present

## 2019-07-23 DIAGNOSIS — Z9181 History of falling: Secondary | ICD-10-CM | POA: Diagnosis not present

## 2019-07-23 DIAGNOSIS — Z96651 Presence of right artificial knee joint: Secondary | ICD-10-CM | POA: Diagnosis not present

## 2019-07-23 DIAGNOSIS — Z87891 Personal history of nicotine dependence: Secondary | ICD-10-CM | POA: Diagnosis not present

## 2019-07-29 DIAGNOSIS — G301 Alzheimer's disease with late onset: Secondary | ICD-10-CM | POA: Diagnosis not present

## 2019-07-29 DIAGNOSIS — Z9181 History of falling: Secondary | ICD-10-CM | POA: Diagnosis not present

## 2019-07-29 DIAGNOSIS — Z87891 Personal history of nicotine dependence: Secondary | ICD-10-CM | POA: Diagnosis not present

## 2019-07-29 DIAGNOSIS — F0280 Dementia in other diseases classified elsewhere without behavioral disturbance: Secondary | ICD-10-CM | POA: Diagnosis not present

## 2019-07-29 DIAGNOSIS — Z96651 Presence of right artificial knee joint: Secondary | ICD-10-CM | POA: Diagnosis not present

## 2019-07-29 DIAGNOSIS — F411 Generalized anxiety disorder: Secondary | ICD-10-CM | POA: Diagnosis not present

## 2019-07-29 DIAGNOSIS — E785 Hyperlipidemia, unspecified: Secondary | ICD-10-CM | POA: Diagnosis not present

## 2019-07-29 DIAGNOSIS — Z8546 Personal history of malignant neoplasm of prostate: Secondary | ICD-10-CM | POA: Diagnosis not present

## 2019-07-29 DIAGNOSIS — I10 Essential (primary) hypertension: Secondary | ICD-10-CM | POA: Diagnosis not present

## 2019-07-30 ENCOUNTER — Ambulatory Visit: Payer: Medicare PPO | Admitting: Cardiology

## 2019-07-31 DIAGNOSIS — G301 Alzheimer's disease with late onset: Secondary | ICD-10-CM | POA: Diagnosis not present

## 2019-07-31 DIAGNOSIS — Z96651 Presence of right artificial knee joint: Secondary | ICD-10-CM | POA: Diagnosis not present

## 2019-07-31 DIAGNOSIS — Z87891 Personal history of nicotine dependence: Secondary | ICD-10-CM | POA: Diagnosis not present

## 2019-07-31 DIAGNOSIS — Z9181 History of falling: Secondary | ICD-10-CM | POA: Diagnosis not present

## 2019-07-31 DIAGNOSIS — F411 Generalized anxiety disorder: Secondary | ICD-10-CM | POA: Diagnosis not present

## 2019-07-31 DIAGNOSIS — F0280 Dementia in other diseases classified elsewhere without behavioral disturbance: Secondary | ICD-10-CM | POA: Diagnosis not present

## 2019-07-31 DIAGNOSIS — E785 Hyperlipidemia, unspecified: Secondary | ICD-10-CM | POA: Diagnosis not present

## 2019-07-31 DIAGNOSIS — Z8546 Personal history of malignant neoplasm of prostate: Secondary | ICD-10-CM | POA: Diagnosis not present

## 2019-07-31 DIAGNOSIS — I10 Essential (primary) hypertension: Secondary | ICD-10-CM | POA: Diagnosis not present

## 2019-08-03 ENCOUNTER — Ambulatory Visit: Payer: Medicare PPO | Admitting: Cardiology

## 2019-08-03 DIAGNOSIS — G309 Alzheimer's disease, unspecified: Secondary | ICD-10-CM | POA: Diagnosis not present

## 2019-08-03 DIAGNOSIS — R2681 Unsteadiness on feet: Secondary | ICD-10-CM | POA: Diagnosis not present

## 2019-08-03 DIAGNOSIS — I959 Hypotension, unspecified: Secondary | ICD-10-CM | POA: Diagnosis not present

## 2019-08-03 DIAGNOSIS — R296 Repeated falls: Secondary | ICD-10-CM | POA: Diagnosis not present

## 2019-08-03 DIAGNOSIS — R001 Bradycardia, unspecified: Secondary | ICD-10-CM | POA: Diagnosis not present

## 2019-08-04 ENCOUNTER — Ambulatory Visit: Payer: Medicare PPO | Admitting: Cardiology

## 2019-08-04 DIAGNOSIS — I10 Essential (primary) hypertension: Secondary | ICD-10-CM | POA: Diagnosis not present

## 2019-08-04 DIAGNOSIS — Z96651 Presence of right artificial knee joint: Secondary | ICD-10-CM | POA: Diagnosis not present

## 2019-08-04 DIAGNOSIS — Z8546 Personal history of malignant neoplasm of prostate: Secondary | ICD-10-CM | POA: Diagnosis not present

## 2019-08-04 DIAGNOSIS — Z87891 Personal history of nicotine dependence: Secondary | ICD-10-CM | POA: Diagnosis not present

## 2019-08-04 DIAGNOSIS — F0280 Dementia in other diseases classified elsewhere without behavioral disturbance: Secondary | ICD-10-CM | POA: Diagnosis not present

## 2019-08-04 DIAGNOSIS — Z9181 History of falling: Secondary | ICD-10-CM | POA: Diagnosis not present

## 2019-08-04 DIAGNOSIS — G301 Alzheimer's disease with late onset: Secondary | ICD-10-CM | POA: Diagnosis not present

## 2019-08-04 DIAGNOSIS — F411 Generalized anxiety disorder: Secondary | ICD-10-CM | POA: Diagnosis not present

## 2019-08-04 DIAGNOSIS — E785 Hyperlipidemia, unspecified: Secondary | ICD-10-CM | POA: Diagnosis not present

## 2019-08-06 DIAGNOSIS — Z20828 Contact with and (suspected) exposure to other viral communicable diseases: Secondary | ICD-10-CM | POA: Diagnosis not present

## 2019-08-10 ENCOUNTER — Encounter: Payer: Self-pay | Admitting: Cardiology

## 2019-08-10 ENCOUNTER — Ambulatory Visit (INDEPENDENT_AMBULATORY_CARE_PROVIDER_SITE_OTHER): Payer: Medicare PPO | Admitting: Cardiology

## 2019-08-10 ENCOUNTER — Other Ambulatory Visit: Payer: Self-pay

## 2019-08-10 VITALS — BP 110/70 | HR 52 | Temp 97.3°F | Ht 74.0 in | Wt 151.5 lb

## 2019-08-10 DIAGNOSIS — R001 Bradycardia, unspecified: Secondary | ICD-10-CM

## 2019-08-10 DIAGNOSIS — I959 Hypotension, unspecified: Secondary | ICD-10-CM | POA: Diagnosis not present

## 2019-08-10 DIAGNOSIS — G309 Alzheimer's disease, unspecified: Secondary | ICD-10-CM | POA: Diagnosis not present

## 2019-08-10 DIAGNOSIS — R2681 Unsteadiness on feet: Secondary | ICD-10-CM | POA: Diagnosis not present

## 2019-08-10 NOTE — Progress Notes (Signed)
Cardiology Office Note:    Date:  08/10/2019   ID:  Carlos Clark., DOB 1926-01-25, MRN [id number]  PCP:  Einar Pheasant, MD  Cardiologist:  Kate Sable, MD  Electrophysiologist:  None   Referring MD: Einar Pheasant, MD   Chief Complaint  Patient presents with  . New Patient (Initial Visit)    Bradycardia; Meds verbally reviewed with patient.    History of Present Illness:    Carlos Hayden. is a 85 y.o. male with a hx of Alzheimer's, prostate cancer, hypertension who presents due to bradycardia.  Patient lives in an assisted living facility.  The rounding physician noticed his heart rate to be bradycardic and wanted him to get checked out.    Patient in the room with aide due to his condition.  He denies any history of syncope, chest pain or shortness of breath.  He walks around in the facility with no assistance and no symptoms.  Past Medical History:  Diagnosis Date  . Cancer ([place]) 11/28/10   Prostate  . Hypertension     Past Surgical History:  Procedure Laterality Date  . TONSILLECTOMY  1930's  . TOTAL KNEE ARTHROPLASTY     right    Current Medications: Current Meds  Medication Sig  . cyanocobalamin 1000 MCG tablet Take 1,000 mcg by mouth daily.  Marland Kitchen donepezil (ARICEPT) 5 MG tablet TAKE 1 TABLET BY MOUTH ONCE DAILY AT BEDTIME (Patient taking differently: Take 5 mg by mouth at bedtime. )  . Melatonin 3 MG TABS Take 3 mg by mouth at bedtime.  Marland Kitchen QUEtiapine (SEROQUEL) 25 MG tablet Take 1 tablet (25 mg total) by mouth at bedtime. (Patient taking differently: Take 50 mg by mouth at bedtime. )  . senna-docusate (SENOKOT S) 8.6-50 MG tablet Take 2 tablets by mouth daily.  . sertraline (ZOLOFT) 50 MG tablet Take 50 mg by mouth daily.     Allergies:   Patient has no known allergies.   Social History   Socioeconomic History  . Marital status: Married    Spouse name: Not on file  . Number of children: Not on file  . Years of education:  Not on file  . Highest education level: Not on file  Occupational History  . Not on file  Social Needs  . Financial resource strain: Not on file  . Food insecurity    Worry: Not on file    Inability: Not on file  . Transportation needs    Medical: Not on file    Non-medical: Not on file  Tobacco Use  . Smoking status: Former Research scientist (life sciences)  . Smokeless tobacco: Never Used  . Tobacco comment: QUIT SMOKING IN THE EARLY 50'S  Substance and Sexual Activity  . Alcohol use: No    Alcohol/week: 0.0 standard drinks  . Drug use: No  . Sexual activity: Not on file  Lifestyle  . Physical activity    Days per week: Not on file    Minutes per session: Not on file  . Stress: Not on file  Relationships  . Social Herbalist on phone: Not on file    Gets together: Not on file    Attends religious service: Not on file    Active member of club or organization: Not on file    Attends meetings of clubs or organizations: Not on file    Relationship status: Not on file  Other Topics Concern  . Not on file  Social History Narrative  .  Not on file     Family History: The patient's family history includes Cancer in his daughter; Heart attack in his daughter; Lung cancer in his mother; Prostate cancer in his father; Stroke in his daughter.  ROS:   Please see the history of present illness.     All other systems reviewed and are negative.  EKGs/Labs/Other Studies Reviewed:    The following studies were reviewed today:   EKG:  EKG is  ordered today.  The ekg ordered today demonstrates sinus bradycardia, heart rate 52, otherwise normal ECG.  Recent Labs: 12/05/2018: ALT 9; TSH 3.10 04/22/2019: BUN 27; Creatinine, Ser 0.65; Hemoglobin 14.1; Platelets 153; Potassium 3.8; Sodium 141  Recent Lipid Panel    Component Value Date/Time   CHOL 162 07/02/2018 1025   TRIG 178.0 (H) 07/02/2018 1025   HDL 32.70 (L) 07/02/2018 1025   CHOLHDL 5 07/02/2018 1025   VLDL 35.6 07/02/2018 1025   LDLCALC  93 07/02/2018 1025    Physical Exam:    VS:  BP 110/70 (BP Location: Right Arm, Patient Position: Sitting, Cuff Size: Normal)   Pulse (!) 52   Temp (!) 97.3 F (36.3 C)   Ht 6\' 2"  (1.88 m)   Wt 151 lb 8 oz (68.7 kg)   BMI 19.45 kg/m     Wt Readings from Last 3 Encounters:  08/10/19 151 lb 8 oz (68.7 kg)  12/05/18 169 lb 3 oz (76.7 kg)  08/18/18 172 lb 3.2 oz (78.1 kg)     GEN:  Well nourished, well developed in no acute distress HEENT: Normal NECK: No JVD; No carotid bruits LYMPHATICS: No lymphadenopathy CARDIAC: Bradycardic, regular, no murmurs, rubs, gallops RESPIRATORY:  Clear to auscultation without rales, wheezing or rhonchi  ABDOMEN: Soft, non-tender, non-distended  MUSCULOSKELETAL:  No edema; No deformity  SKIN: Warm and dry NEUROLOGIC:  Alert and oriented to person and place PSYCHIATRIC:  Normal affect   ASSESSMENT:   Review of prior EKGs show patient has been in sinus bradycardia for over 10 years now.  He does not seem to have any symptoms associated with the bradycardia.  Current EKG in the office shows sinus bradycardia with heart rate 52. 1. Slow heart rate      PLAN:    In order of problems listed above:  1. Asymptomatic sinus bradycardia.  No cardiac testing and or intervention indicated at this point.  Total encounter time more than 35 minutes  Greater than 50% was spent in counseling and coordination of care with the patient  This note was generated in part or whole with voice recognition software. Voice recognition is usually quite accurate but there are transcription errors that can and very often do occur. I apologize for any typographical errors that were not detected and corrected.  Medication Adjustments/Labs and Tests Ordered: Current medicines are reviewed at length with the patient today.  Concerns regarding medicines are outlined above.  Orders Placed This Encounter  Procedures  . EKG 12-Lead   No orders of the defined types were placed  in this encounter.   Patient Instructions  Medication Instructions:  Your physician recommends that you continue on your current medications as directed. Please refer to the Current Medication list given to you today.  *If you need a refill on your cardiac medications before your next appointment, please call your pharmacy*  Lab Work: none If you have labs (blood work) drawn today and your tests are completely normal, you will receive your results only by: Marland Kitchen MyChart  Message (if you have MyChart) OR . A paper copy in the mail If you have any lab test that is abnormal or we need to change your treatment, we will call you to review the results.  Testing/Procedures: none  Follow-Up: At [facility], you and your health needs are our priority.  As part of our continuing mission to provide you with exceptional heart care, we have created designated Provider Care Teams.  These Care Teams include your primary Cardiologist (physician) and Advanced Practice Providers (APPs -  Physician Assistants and Nurse Practitioners) who all work together to provide you with the care you need, when you need it.  Your next appointment:   as needed.  The format for your next appointment:   In Person  Provider:    You may see Kate Sable, MD or one of the following Advanced Practice Providers on your designated Care Team:    Murray Hodgkins, NP  Christell Faith, PA-C  Marrianne Mood, PA-C     Signed, Kate Sable, MD  08/10/2019 2:54 PM    [place]

## 2019-08-10 NOTE — Patient Instructions (Signed)
Medication Instructions:  Your physician recommends that you continue on your current medications as directed. Please refer to the Current Medication list given to you today.  *If you need a refill on your cardiac medications before your next appointment, please call your pharmacy*  Lab Work: none If you have labs (blood work) drawn today and your tests are completely normal, you will receive your results only by: Marland Kitchen MyChart Message (if you have MyChart) OR . A paper copy in the mail If you have any lab test that is abnormal or we need to change your treatment, we will call you to review the results.  Testing/Procedures: none  Follow-Up: At [facility], you and your health needs are our priority.  As part of our continuing mission to provide you with exceptional heart care, we have created designated Provider Care Teams.  These Care Teams include your primary Cardiologist (physician) and Advanced Practice Providers (APPs -  Physician Assistants and Nurse Practitioners) who all work together to provide you with the care you need, when you need it.  Your next appointment:   as needed.  The format for your next appointment:   In Person  Provider:    You may see Kate Sable, MD or one of the following Advanced Practice Providers on your designated Care Team:    Murray Hodgkins, NP  Christell Faith, PA-C  Marrianne Mood, PA-C

## 2019-08-12 DIAGNOSIS — Z79899 Other long term (current) drug therapy: Secondary | ICD-10-CM | POA: Diagnosis not present

## 2019-08-12 DIAGNOSIS — R001 Bradycardia, unspecified: Secondary | ICD-10-CM | POA: Diagnosis not present

## 2019-08-13 DIAGNOSIS — R001 Bradycardia, unspecified: Secondary | ICD-10-CM | POA: Diagnosis not present

## 2019-08-13 DIAGNOSIS — Z79899 Other long term (current) drug therapy: Secondary | ICD-10-CM | POA: Diagnosis not present

## 2019-08-15 ENCOUNTER — Emergency Department: Payer: Medicare PPO

## 2019-08-15 ENCOUNTER — Emergency Department
Admission: EM | Admit: 2019-08-15 | Discharge: 2019-08-15 | Disposition: A | Discharge status: Skilled Nursing Facility | Payer: Medicare PPO | Attending: Emergency Medicine | Admitting: Emergency Medicine

## 2019-08-15 ENCOUNTER — Encounter: Payer: Self-pay | Admitting: Emergency Medicine

## 2019-08-15 ENCOUNTER — Other Ambulatory Visit: Payer: Self-pay

## 2019-08-15 DIAGNOSIS — R0902 Hypoxemia: Secondary | ICD-10-CM | POA: Diagnosis not present

## 2019-08-15 DIAGNOSIS — Y939 Activity, unspecified: Secondary | ICD-10-CM | POA: Diagnosis not present

## 2019-08-15 DIAGNOSIS — R001 Bradycardia, unspecified: Secondary | ICD-10-CM | POA: Diagnosis not present

## 2019-08-15 DIAGNOSIS — F0390 Unspecified dementia without behavioral disturbance: Secondary | ICD-10-CM | POA: Diagnosis not present

## 2019-08-15 DIAGNOSIS — Z87891 Personal history of nicotine dependence: Secondary | ICD-10-CM | POA: Diagnosis not present

## 2019-08-15 DIAGNOSIS — Y999 Unspecified external cause status: Secondary | ICD-10-CM | POA: Diagnosis not present

## 2019-08-15 DIAGNOSIS — I10 Essential (primary) hypertension: Secondary | ICD-10-CM | POA: Diagnosis not present

## 2019-08-15 DIAGNOSIS — S80212A Abrasion, left knee, initial encounter: Secondary | ICD-10-CM | POA: Diagnosis not present

## 2019-08-15 DIAGNOSIS — Z79899 Other long term (current) drug therapy: Secondary | ICD-10-CM | POA: Diagnosis not present

## 2019-08-15 DIAGNOSIS — W19XXXA Unspecified fall, initial encounter: Secondary | ICD-10-CM | POA: Insufficient documentation

## 2019-08-15 DIAGNOSIS — S199XXA Unspecified injury of neck, initial encounter: Secondary | ICD-10-CM | POA: Diagnosis not present

## 2019-08-15 DIAGNOSIS — R5381 Other malaise: Secondary | ICD-10-CM | POA: Diagnosis not present

## 2019-08-15 DIAGNOSIS — S0990XA Unspecified injury of head, initial encounter: Secondary | ICD-10-CM | POA: Diagnosis not present

## 2019-08-15 DIAGNOSIS — Z743 Need for continuous supervision: Secondary | ICD-10-CM | POA: Diagnosis not present

## 2019-08-15 DIAGNOSIS — Y92129 Unspecified place in nursing home as the place of occurrence of the external cause: Secondary | ICD-10-CM | POA: Diagnosis not present

## 2019-08-15 DIAGNOSIS — S0001XA Abrasion of scalp, initial encounter: Secondary | ICD-10-CM | POA: Diagnosis not present

## 2019-08-15 DIAGNOSIS — S0081XA Abrasion of other part of head, initial encounter: Secondary | ICD-10-CM | POA: Insufficient documentation

## 2019-08-15 DIAGNOSIS — R279 Unspecified lack of coordination: Secondary | ICD-10-CM | POA: Diagnosis not present

## 2019-08-15 DIAGNOSIS — I959 Hypotension, unspecified: Secondary | ICD-10-CM | POA: Diagnosis not present

## 2019-08-15 DIAGNOSIS — R410 Disorientation, unspecified: Secondary | ICD-10-CM | POA: Diagnosis not present

## 2019-08-15 HISTORY — DX: Unspecified dementia, unspecified severity, without behavioral disturbance, psychotic disturbance, mood disturbance, and anxiety: F03.90

## 2019-08-15 LAB — CBC WITH DIFFERENTIAL/PLATELET
Abs Immature Granulocytes: 0.01 10*3/uL (ref 0.00–0.07)
Basophils Absolute: 0 10*3/uL (ref 0.0–0.1)
Basophils Relative: 1 %
Eosinophils Absolute: 0.2 10*3/uL (ref 0.0–0.5)
Eosinophils Relative: 4 %
HCT: 39.8 % (ref 39.0–52.0)
Hemoglobin: 13.4 g/dL (ref 13.0–17.0)
Immature Granulocytes: 0 %
Lymphocytes Relative: 25 %
Lymphs Abs: 1.4 10*3/uL (ref 0.7–4.0)
MCH: 31.9 pg (ref 26.0–34.0)
MCHC: 33.7 g/dL (ref 30.0–36.0)
MCV: 94.8 fL (ref 80.0–100.0)
Monocytes Absolute: 0.5 10*3/uL (ref 0.1–1.0)
Monocytes Relative: 9 %
Neutro Abs: 3.2 10*3/uL (ref 1.7–7.7)
Neutrophils Relative %: 61 %
Platelets: 155 10*3/uL (ref 150–400)
RBC: 4.2 MIL/uL — ABNORMAL LOW (ref 4.22–5.81)
RDW: 12.2 % (ref 11.5–15.5)
WBC: 5.4 10*3/uL (ref 4.0–10.5)
nRBC: 0 % (ref 0.0–0.2)

## 2019-08-15 LAB — COMPREHENSIVE METABOLIC PANEL
ALT: 12 U/L (ref 0–44)
AST: 16 U/L (ref 15–41)
Albumin: 3.8 g/dL (ref 3.5–5.0)
Alkaline Phosphatase: 65 U/L (ref 38–126)
Anion gap: 9 (ref 5–15)
BUN: 24 mg/dL — ABNORMAL HIGH (ref 8–23)
CO2: 26 mmol/L (ref 22–32)
Calcium: 8.7 mg/dL — ABNORMAL LOW (ref 8.9–10.3)
Chloride: 105 mmol/L (ref 98–111)
Creatinine, Ser: 0.78 mg/dL (ref 0.61–1.24)
GFR calc Af Amer: >60 mL/min (ref >60)
GFR calc non Af Amer: >60 mL/min (ref >60)
Glucose, Bld: 107 mg/dL — ABNORMAL HIGH (ref 70–99)
Potassium: 4.2 mmol/L (ref 3.5–5.1)
Sodium: 140 mmol/L (ref 135–145)
Total Bilirubin: 0.5 mg/dL (ref 0.3–1.2)
Total Protein: 6.6 g/dL (ref 6.5–8.1)

## 2019-08-15 LAB — TROPONIN I (HIGH SENSITIVITY): Troponin I (High Sensitivity): 4 ng/L (ref <18)

## 2019-08-15 NOTE — ED Notes (Signed)
Unable to obtain e-signature due to patient's mental status.

## 2019-08-15 NOTE — ED Provider Notes (Signed)
[facility] Emergency Department Provider Note   ____________________________________________   First MD Initiated Contact with Patient 08/15/19 1900     (approximate)  I have reviewed the triage vital signs and the nursing notes.   HISTORY  Chief Complaint Fall    HPI Carlos Hayden. is a 85 y.o. male who fell.  He does not remember why he fell.  He is not having any complaints of anything at this time.  He says he feels well.         Past Medical History:  Diagnosis Date  . Cancer ([place]) 11/28/10   Prostate  . Dementia ([place])   . Hypertension     Patient Active Problem List   Diagnosis Date Noted  . Change in mental status 07/02/2018  . Acute viral syndrome 10/08/2017  . Thrombocytopenia ([place]) 07/07/2017  . Pancreatic mass 07/06/2017  . Aneurysm of infrarenal abdominal aorta (HCC) 07/06/2017  . Memory change 03/15/2017  . Unsteady gait 07/11/2014  . Essential hypertension 05/25/2008  . Hypercholesterolemia 04/14/2008  . History of prostate cancer 04/14/2008  . VASOVAGAL SYNCOPE 08/05/2007    Past Surgical History:  Procedure Laterality Date  . TONSILLECTOMY  1930's  . TOTAL KNEE ARTHROPLASTY     right    Prior to Admission medications   Medication Sig Start Date End Date Taking? Authorizing Provider  cyanocobalamin 1000 MCG tablet Take 1,000 mcg by mouth daily.    [provider]  donepezil (ARICEPT) 5 MG tablet TAKE 1 TABLET BY MOUTH ONCE DAILY AT BEDTIME Patient taking differently: Take 5 mg by mouth at bedtime.  04/02/19   Einar Pheasant, MD  lisinopril (PRINIVIL,ZESTRIL) 2.5 MG tablet TAKE 1 TABLET BY MOUTH ONCE PER DAY Patient not taking: Reported on 08/10/2019 05/26/18   Einar Pheasant, MD  Melatonin 3 MG TABS Take 3 mg by mouth at bedtime.    [provider]  QUEtiapine (SEROQUEL) 25 MG tablet Take 1 tablet (25 mg total) by mouth at bedtime. Patient taking differently: Take 50 mg by mouth at  bedtime.  12/12/18   Einar Pheasant, MD  senna-docusate (SENOKOT S) 8.6-50 MG tablet Take 2 tablets by mouth daily.    [provider]  sertraline (ZOLOFT) 50 MG tablet Take 50 mg by mouth daily. 03/30/19   [provider]    Allergies Patient has no known allergies.  Family History  Problem Relation Age of Onset  . Prostate cancer Father   . Lung cancer Mother   . Cancer Daughter   . Stroke Daughter   . Heart attack Daughter     Social History Social History   Tobacco Use  . Smoking status: Former Research scientist (life sciences)  . Smokeless tobacco: Never Used  . Tobacco comment: QUIT SMOKING IN THE EARLY 50'S  Substance Use Topics  . Alcohol use: No    Alcohol/week: 0.0 standard drinks  . Drug use: No    Review of Systems  Constitutional: No fever/chills Eyes: No visual changes. ENT: No sore throat. Cardiovascular: Denies chest pain. Respiratory: Denies shortness of breath. Gastrointestinal: No abdominal pain.  No nausea, no vomiting.  No diarrhea.  No constipation. Genitourinary: Negative for dysuria. Musculoskeletal: Negative for back pain. Skin: Negative for rash. Neurological: Negative for headaches, focal weakness   ____________________________________________   PHYSICAL EXAM:  VITAL SIGNS: ED Triage Vitals  Enc Vitals Group     BP 08/15/19 1838 (!) 143/51     Pulse Rate 08/15/19 1837 (!) 55  Resp 08/15/19 1837 16     Temp 08/15/19 1837 97.9 F (36.6 C)     Temp Source 08/15/19 1837 Oral     SpO2 08/15/19 1837 96 %     Weight 08/15/19 1840 151 lb 7.3 oz (68.7 kg)     Height 08/15/19 1840 5\' 9"  (1.753 m)     Head Circumference --      Peak Flow --      Pain Score --      Pain Loc --      Pain Edu? --      Excl. in [place]? --     Constitutional: Alert and oriented. Well appearing and in no acute distress. Eyes: Conjunctivae are normal.  Head: Atraumatic except for small abrasion on scalp. Nose: No congestion/rhinnorhea. Mouth/Throat: Mucous  membranes are moist.  Oropharynx non-erythematous. Neck: No stridor.  No cervical spine tenderness to palpation. Cardiovascular: Normal rate, regular rhythm. Grossly normal heart sounds.  Good peripheral circulation. Respiratory: Normal respiratory effort.  No retractions. Lungs CTAB. Gastrointestinal: Soft and nontender. No distention. No abdominal bruits. No CVA tenderness. Musculoskeletal: No lower extremity tenderness trace edema.   Neurologic:  Normal speech and language. No gross focal neurologic deficits are appreciated. No gait instability. Skin:  Skin is warm, dry and intact except as noted above. No rash noted.   ____________________________________________   LABS (all labs ordered are listed, but only abnormal results are displayed)  Labs Reviewed  COMPREHENSIVE METABOLIC PANEL  CBC WITH DIFFERENTIAL/PLATELET  TROPONIN I (HIGH SENSITIVITY)   ____________________________________________  EKG  EKG read interpreted by me shows sinus bradycardia rate of 52 normal axis computer is reading ST elevation inferiorly there is not any significant ST elevation inferiorly except possibly in lead aVF.. ____________________________________________  RADIOLOGY  ED MD interpretation: CT of the head and neck are negative.  Official radiology report(s): No results found.  ____________________________________________   PROCEDURES  Procedure(s) performed (including Critical Care):  Procedures   ____________________________________________   INITIAL IMPRESSION / ASSESSMENT AND PLAN / ED COURSE  [name] was evaluated in Emergency Department on 08/15/2019 for the symptoms described in the history of present illness. He was evaluated in the context of the global COVID-19 pandemic, which necessitated consideration that the patient might be at risk for infection with the SARS-CoV-2 virus that causes COVID-19. Institutional protocols and algorithms that pertain to  the evaluation of patients at risk for COVID-19 are in a state of rapid change based on information released by regulatory bodies including the CDC and federal and state organizations. These policies and algorithms were followed during the patient's care in the ED.    Patient feels well.  Everything looks okay.  I will let him go.  He is a little bit bradycardic but not significantly so at this point.  EKG was done while he was resting.  Review of prior EKGs so that this is about his regular rate.          ____________________________________________   FINAL CLINICAL IMPRESSION(S) / ED DIAGNOSES  Final diagnoses:  None     ED Discharge Orders    None       Note:  This document was prepared using Dragon voice recognition software and may include unintentional dictation errors.    Nena Polio, MD 08/15/19 2052

## 2019-08-15 NOTE — ED Notes (Signed)
Attempted to call report to [facility] two times with no answer.

## 2019-08-15 NOTE — ED Triage Notes (Signed)
Arrives via [facility] from [facility]. S/P fall.  Abrasion to left forehead and left knee.  Patient is awake and alert to person, which is baseline.

## 2019-08-15 NOTE — ED Notes (Signed)
Patient transported to CT 

## 2019-08-15 NOTE — ED Notes (Signed)
Patient resting quietly in room with no apparent acute distress at this time.  Oriented to self.  Asked why he was here.  Will continue to monitor.

## 2019-08-15 NOTE — ED Notes (Signed)
Contacted legal guardian Khi Costilla, daughter, at [phone] to give update.

## 2019-08-15 NOTE — Discharge Instructions (Addendum)
Please return for any further problems °

## 2019-08-16 ENCOUNTER — Emergency Department
Admission: EM | Admit: 2019-08-16 | Discharge: 2019-08-16 | Disposition: A | Discharge status: Home / Self Care | Payer: Medicare PPO | Attending: Emergency Medicine | Admitting: Emergency Medicine

## 2019-08-16 ENCOUNTER — Other Ambulatory Visit: Payer: Self-pay

## 2019-08-16 ENCOUNTER — Encounter: Payer: Self-pay | Admitting: Emergency Medicine

## 2019-08-16 DIAGNOSIS — Z87891 Personal history of nicotine dependence: Secondary | ICD-10-CM | POA: Insufficient documentation

## 2019-08-16 DIAGNOSIS — Y9389 Activity, other specified: Secondary | ICD-10-CM | POA: Diagnosis not present

## 2019-08-16 DIAGNOSIS — R404 Transient alteration of awareness: Secondary | ICD-10-CM | POA: Diagnosis not present

## 2019-08-16 DIAGNOSIS — F0390 Unspecified dementia without behavioral disturbance: Secondary | ICD-10-CM | POA: Insufficient documentation

## 2019-08-16 DIAGNOSIS — Z79899 Other long term (current) drug therapy: Secondary | ICD-10-CM | POA: Insufficient documentation

## 2019-08-16 DIAGNOSIS — I10 Essential (primary) hypertension: Secondary | ICD-10-CM | POA: Diagnosis not present

## 2019-08-16 DIAGNOSIS — S0990XA Unspecified injury of head, initial encounter: Secondary | ICD-10-CM

## 2019-08-16 DIAGNOSIS — W19XXXA Unspecified fall, initial encounter: Secondary | ICD-10-CM

## 2019-08-16 DIAGNOSIS — Z743 Need for continuous supervision: Secondary | ICD-10-CM | POA: Diagnosis not present

## 2019-08-16 DIAGNOSIS — Z8546 Personal history of malignant neoplasm of prostate: Secondary | ICD-10-CM | POA: Diagnosis not present

## 2019-08-16 DIAGNOSIS — Y998 Other external cause status: Secondary | ICD-10-CM | POA: Diagnosis not present

## 2019-08-16 DIAGNOSIS — Z96651 Presence of right artificial knee joint: Secondary | ICD-10-CM | POA: Diagnosis not present

## 2019-08-16 DIAGNOSIS — R279 Unspecified lack of coordination: Secondary | ICD-10-CM | POA: Diagnosis not present

## 2019-08-16 DIAGNOSIS — Y929 Unspecified place or not applicable: Secondary | ICD-10-CM | POA: Diagnosis not present

## 2019-08-16 NOTE — ED Notes (Signed)
Pt unable to sign  

## 2019-08-16 NOTE — Discharge Instructions (Addendum)
Patient with hematoma to the scalp, reassuring exam otherwise. Wound dressed. Daily wound care please

## 2019-08-16 NOTE — ED Triage Notes (Signed)
Pt had a fall today.  Has been having multiple falls and fell today. Small abrasion like area to top of head.  Here yesterday for same. Pt alert and agitated with vital signs.

## 2019-08-16 NOTE — ED Provider Notes (Signed)
[facility] Emergency Department Provider Note   ____________________________________________    I have reviewed the triage vital signs and the nursing notes.   HISTORY  Chief Complaint Fall  History limited due to dementia   HPI Carlos Hayden. is a 85 y.o. male with a history of dementia and frequent falls due to unsteady gait who presents with fall.  Patient apparently fell and injured his head.  He denies any other injuries although limited by dementia.  Patient was seen yesterday after a fall and had normal imaging/work-up  Past Medical History:  Diagnosis Date  . Cancer ([place]) 11/28/10   Prostate  . Dementia ([place])   . Hypertension     Patient Active Problem List   Diagnosis Date Noted  . Change in mental status 07/02/2018  . Acute viral syndrome 10/08/2017  . Thrombocytopenia ([place]) 07/07/2017  . Pancreatic mass 07/06/2017  . Aneurysm of infrarenal abdominal aorta (HCC) 07/06/2017  . Memory change 03/15/2017  . Unsteady gait 07/11/2014  . Essential hypertension 05/25/2008  . Hypercholesterolemia 04/14/2008  . History of prostate cancer 04/14/2008  . VASOVAGAL SYNCOPE 08/05/2007    Past Surgical History:  Procedure Laterality Date  . TONSILLECTOMY  1930's  . TOTAL KNEE ARTHROPLASTY     right    Prior to Admission medications   Medication Sig Start Date End Date Taking? Authorizing Provider  cyanocobalamin 1000 MCG tablet Take 1,000 mcg by mouth daily.    [provider]  donepezil (ARICEPT) 5 MG tablet TAKE 1 TABLET BY MOUTH ONCE DAILY AT BEDTIME Patient taking differently: Take 5 mg by mouth at bedtime.  04/02/19   Einar Pheasant, MD  lisinopril (PRINIVIL,ZESTRIL) 2.5 MG tablet TAKE 1 TABLET BY MOUTH ONCE PER DAY Patient not taking: Reported on 08/10/2019 05/26/18   Einar Pheasant, MD  Melatonin 3 MG TABS Take 3 mg by mouth at bedtime.    [provider]  QUEtiapine (SEROQUEL) 50 MG tablet Take  25-50 mg by mouth 2 (two) times daily. Take one-half tablet (25 mg) by mouth in the morning and one tablet (50mg ) at bedtime 07/14/19   [provider]  senna-docusate (SENOKOT S) 8.6-50 MG tablet Take 2 tablets by mouth daily.    [provider]  sertraline (ZOLOFT) 50 MG tablet Take 50 mg by mouth daily. 03/30/19   [provider]     Allergies Patient has no known allergies.  Family History  Problem Relation Age of Onset  . Prostate cancer Father   . Lung cancer Mother   . Cancer Daughter   . Stroke Daughter   . Heart attack Daughter     Social History Social History   Tobacco Use  . Smoking status: Former Research scientist (life sciences)  . Smokeless tobacco: Never Used  . Tobacco comment: QUIT SMOKING IN THE EARLY 50'S  Substance Use Topics  . Alcohol use: No    Alcohol/week: 0.0 standard drinks  . Drug use: No    Review of Systems  Constitutional: Denies dizziness  ENT: No injury noted to the face   Gastrointestinal: Denies abdominal pain  Musculoskeletal: No back pain or extremity pain or chest wall pain Skin: Hematoma to the scalp Neurological: Negative for neuro deficits    ____________________________________________   PHYSICAL EXAM:  VITAL SIGNS: ED Triage Vitals  Enc Vitals Group     BP 08/16/19 1849 (!) 154/58     Pulse Rate 08/16/19 1849 (!) 53     Resp 08/16/19 1849 18  Temp 08/16/19 1849 97.9 F (36.6 C)     Temp Source 08/16/19 1849 Oral     SpO2 08/16/19 1849 99 %     Weight 08/16/19 1844 68.7 kg (151 lb 7.3 oz)     Height 08/16/19 1844 1.753 m (5\' 9" )     Head Circumference --      Peak Flow --      Pain Score --      Pain Loc --      Pain Edu? --      Excl. in [place]? --      Constitutional: Alert  Eyes: Conjunctivae are normal.  Head: Approximately 2 x 2 centimeter hematoma to the crown of the head, bleeding controlled Nose: No swelling or epistaxis Mouth/Throat: Mucous membranes are moist.   Cardiovascular: Normal rate,  regular rhythm.  No chest wall tenderness palpation Respiratory: Normal respiratory effort.  No retractions. Genitourinary: deferred Musculoskeletal: Moves all extremities well, no pain with axial load on both hips.  No tenderness to the pelvis, no vertebral tenderness Neurologic:  Normal speech and language. No gross focal neurologic deficits are appreciated.   Skin:  Skin is warm, dry    ____________________________________________   LABS (all labs ordered are listed, but only abnormal results are displayed)  Labs Reviewed - No data to display ____________________________________________  EKG   ____________________________________________  RADIOLOGY  None ____________________________________________   PROCEDURES  Procedure(s) performed: No  Procedures   Critical Care performed: No ____________________________________________   INITIAL IMPRESSION / ASSESSMENT AND PLAN / ED COURSE  Pertinent labs & imaging results that were available during my care of the patient were reviewed by me and considered in my medical decision making (see chart for details).  Patient with hematoma to the scalp however neuro intact.  Wound dressed.  No other injuries on thorough exam.  Given reassuring exam do not believe that repeat imaging is necessary today.  Wound care recommended, outpatient follow-up   ____________________________________________   FINAL CLINICAL IMPRESSION(S) / ED DIAGNOSES  Final diagnoses:  Fall, initial encounter  Injury of head, initial encounter      NEW MEDICATIONS STARTED DURING THIS VISIT:  Discharge Medication List as of 08/16/2019  6:52 PM       Note:  This document was prepared using Dragon voice recognition software and may include unintentional dictation errors.   Lavonia Drafts, MD 08/16/19 2228

## 2019-08-16 NOTE — ED Notes (Addendum)
Spoke with wesla daughter and informed of visit in ED and plans to return back.   She is ok with this plan.

## 2019-08-17 DIAGNOSIS — R001 Bradycardia, unspecified: Secondary | ICD-10-CM | POA: Diagnosis not present

## 2019-08-17 DIAGNOSIS — G301 Alzheimer's disease with late onset: Secondary | ICD-10-CM | POA: Diagnosis not present

## 2019-08-17 DIAGNOSIS — R634 Abnormal weight loss: Secondary | ICD-10-CM | POA: Diagnosis not present

## 2019-08-17 DIAGNOSIS — S0083XD Contusion of other part of head, subsequent encounter: Secondary | ICD-10-CM | POA: Diagnosis not present

## 2019-08-17 DIAGNOSIS — F419 Anxiety disorder, unspecified: Secondary | ICD-10-CM | POA: Diagnosis not present

## 2019-08-17 DIAGNOSIS — R296 Repeated falls: Secondary | ICD-10-CM | POA: Diagnosis not present

## 2019-08-17 DIAGNOSIS — R269 Unspecified abnormalities of gait and mobility: Secondary | ICD-10-CM | POA: Diagnosis not present

## 2019-08-17 DIAGNOSIS — K08109 Complete loss of teeth, unspecified cause, unspecified class: Secondary | ICD-10-CM | POA: Diagnosis not present

## 2019-08-20 ENCOUNTER — Telehealth: Payer: Self-pay | Admitting: Internal Medicine

## 2019-08-20 NOTE — Telephone Encounter (Signed)
error 

## 2019-08-21 ENCOUNTER — Other Ambulatory Visit: Payer: Self-pay

## 2019-08-21 ENCOUNTER — Inpatient Hospital Stay
Admission: EM | Admit: 2019-08-21 | Discharge: 2019-08-31 | DRG: 884 | Disposition: A | Discharge status: Hospice | Payer: Medicare PPO | Attending: Internal Medicine | Admitting: Internal Medicine

## 2019-08-21 ENCOUNTER — Encounter: Payer: Self-pay | Admitting: Emergency Medicine

## 2019-08-21 ENCOUNTER — Emergency Department: Payer: Medicare PPO

## 2019-08-21 DIAGNOSIS — R1900 Intra-abdominal and pelvic swelling, mass and lump, unspecified site: Secondary | ICD-10-CM | POA: Diagnosis not present

## 2019-08-21 DIAGNOSIS — F0390 Unspecified dementia without behavioral disturbance: Secondary | ICD-10-CM | POA: Diagnosis present

## 2019-08-21 DIAGNOSIS — R531 Weakness: Secondary | ICD-10-CM

## 2019-08-21 DIAGNOSIS — Z136 Encounter for screening for cardiovascular disorders: Secondary | ICD-10-CM | POA: Diagnosis not present

## 2019-08-21 DIAGNOSIS — R000 Tachycardia, unspecified: Secondary | ICD-10-CM | POA: Diagnosis not present

## 2019-08-21 DIAGNOSIS — N3001 Acute cystitis with hematuria: Secondary | ICD-10-CM | POA: Diagnosis not present

## 2019-08-21 DIAGNOSIS — Z20828 Contact with and (suspected) exposure to other viral communicable diseases: Secondary | ICD-10-CM | POA: Diagnosis present

## 2019-08-21 DIAGNOSIS — S81811A Laceration without foreign body, right lower leg, initial encounter: Secondary | ICD-10-CM | POA: Diagnosis present

## 2019-08-21 DIAGNOSIS — I714 Abdominal aortic aneurysm, without rupture, unspecified: Secondary | ICD-10-CM

## 2019-08-21 DIAGNOSIS — Z8249 Family history of ischemic heart disease and other diseases of the circulatory system: Secondary | ICD-10-CM | POA: Diagnosis not present

## 2019-08-21 DIAGNOSIS — Z66 Do not resuscitate: Secondary | ICD-10-CM | POA: Diagnosis present

## 2019-08-21 DIAGNOSIS — Z79890 Hormone replacement therapy (postmenopausal): Secondary | ICD-10-CM

## 2019-08-21 DIAGNOSIS — I7143 Infrarenal abdominal aortic aneurysm, without rupture: Secondary | ICD-10-CM | POA: Diagnosis present

## 2019-08-21 DIAGNOSIS — Z8042 Family history of malignant neoplasm of prostate: Secondary | ICD-10-CM

## 2019-08-21 DIAGNOSIS — Z87891 Personal history of nicotine dependence: Secondary | ICD-10-CM | POA: Diagnosis not present

## 2019-08-21 DIAGNOSIS — Z515 Encounter for palliative care: Secondary | ICD-10-CM | POA: Diagnosis not present

## 2019-08-21 DIAGNOSIS — S0101XA Laceration without foreign body of scalp, initial encounter: Secondary | ICD-10-CM | POA: Diagnosis present

## 2019-08-21 DIAGNOSIS — G9341 Metabolic encephalopathy: Secondary | ICD-10-CM | POA: Diagnosis present

## 2019-08-21 DIAGNOSIS — I10 Essential (primary) hypertension: Secondary | ICD-10-CM | POA: Diagnosis present

## 2019-08-21 DIAGNOSIS — E860 Dehydration: Secondary | ICD-10-CM | POA: Diagnosis present

## 2019-08-21 DIAGNOSIS — Z8546 Personal history of malignant neoplasm of prostate: Secondary | ICD-10-CM | POA: Diagnosis not present

## 2019-08-21 DIAGNOSIS — S81812A Laceration without foreign body, left lower leg, initial encounter: Secondary | ICD-10-CM | POA: Diagnosis present

## 2019-08-21 DIAGNOSIS — Z79899 Other long term (current) drug therapy: Secondary | ICD-10-CM | POA: Diagnosis not present

## 2019-08-21 DIAGNOSIS — R296 Repeated falls: Secondary | ICD-10-CM | POA: Diagnosis present

## 2019-08-21 DIAGNOSIS — J449 Chronic obstructive pulmonary disease, unspecified: Secondary | ICD-10-CM | POA: Diagnosis not present

## 2019-08-21 DIAGNOSIS — L89151 Pressure ulcer of sacral region, stage 1: Secondary | ICD-10-CM | POA: Diagnosis present

## 2019-08-21 DIAGNOSIS — W19XXXA Unspecified fall, initial encounter: Secondary | ICD-10-CM | POA: Diagnosis present

## 2019-08-21 DIAGNOSIS — R4182 Altered mental status, unspecified: Secondary | ICD-10-CM | POA: Diagnosis not present

## 2019-08-21 DIAGNOSIS — R404 Transient alteration of awareness: Secondary | ICD-10-CM | POA: Diagnosis not present

## 2019-08-21 DIAGNOSIS — R410 Disorientation, unspecified: Secondary | ICD-10-CM | POA: Diagnosis not present

## 2019-08-21 DIAGNOSIS — Z7189 Other specified counseling: Secondary | ICD-10-CM | POA: Diagnosis not present

## 2019-08-21 DIAGNOSIS — R4020 Unspecified coma: Secondary | ICD-10-CM | POA: Diagnosis not present

## 2019-08-21 LAB — CBC
HCT: 37.8 % — ABNORMAL LOW (ref 39.0–52.0)
Hemoglobin: 13.5 g/dL (ref 13.0–17.0)
MCH: 32.2 pg (ref 26.0–34.0)
MCHC: 35.7 g/dL (ref 30.0–36.0)
MCV: 90.2 fL (ref 80.0–100.0)
Platelets: 153 10*3/uL (ref 150–400)
RBC: 4.19 MIL/uL — ABNORMAL LOW (ref 4.22–5.81)
RDW: 12 % (ref 11.5–15.5)
WBC: 10 10*3/uL (ref 4.0–10.5)
nRBC: 0 % (ref 0.0–0.2)

## 2019-08-21 LAB — URINALYSIS, COMPLETE (UACMP) WITH MICROSCOPIC
Bacteria, UA: NONE SEEN
Bilirubin Urine: NEGATIVE
Glucose, UA: NEGATIVE mg/dL
Ketones, ur: 5 mg/dL — AB
Nitrite: NEGATIVE
Protein, ur: 30 mg/dL — AB
Specific Gravity, Urine: 1.02 (ref 1.005–1.030)
Squamous Epithelial / HPF: NONE SEEN (ref 0–5)
pH: 6 (ref 5.0–8.0)

## 2019-08-21 LAB — BASIC METABOLIC PANEL
Anion gap: 10 (ref 5–15)
BUN: 26 mg/dL — ABNORMAL HIGH (ref 8–23)
CO2: 23 mmol/L (ref 22–32)
Calcium: 8.5 mg/dL — ABNORMAL LOW (ref 8.9–10.3)
Chloride: 102 mmol/L (ref 98–111)
Creatinine, Ser: 0.62 mg/dL (ref 0.61–1.24)
GFR calc Af Amer: >60 mL/min (ref >60)
GFR calc non Af Amer: >60 mL/min (ref >60)
Glucose, Bld: 126 mg/dL — ABNORMAL HIGH (ref 70–99)
Potassium: 4.1 mmol/L (ref 3.5–5.1)
Sodium: 135 mmol/L (ref 135–145)

## 2019-08-21 LAB — BLOOD GAS, VENOUS
Acid-Base Excess: 3.9 mmol/L — ABNORMAL HIGH (ref 0.0–2.0)
Bicarbonate: 30.2 mmol/L — ABNORMAL HIGH (ref 20.0–28.0)
O2 Saturation: 51.2 %
Patient temperature: 37
pCO2, Ven: 51 mmHg (ref 44.0–60.0)
pH, Ven: 7.38 (ref 7.250–7.430)

## 2019-08-21 MED ORDER — ENOXAPARIN SODIUM 40 MG/0.4ML ~~LOC~~ SOLN
40.0000 mg | SUBCUTANEOUS | Status: DC
  Administered 2019-08-21 – 2019-08-25 (×5): 40 mg via SUBCUTANEOUS
  Filled 2019-08-21 (×5): qty 0.4

## 2019-08-21 MED ORDER — QUETIAPINE FUMARATE 25 MG PO TABS
25.0000 mg | ORAL_TABLET | Freq: Two times a day (BID) | ORAL | Status: DC
  Administered 2019-08-21 – 2019-08-27 (×12): 25 mg via ORAL
  Filled 2019-08-21 (×13): qty 1

## 2019-08-21 MED ORDER — SERTRALINE HCL 50 MG PO TABS
50.0000 mg | ORAL_TABLET | Freq: Every day | ORAL | Status: DC
  Administered 2019-08-23 – 2019-08-27 (×5): 50 mg via ORAL
  Filled 2019-08-21 (×6): qty 1

## 2019-08-21 MED ORDER — DEXTROSE-NACL 5-0.45 % IV SOLN
INTRAVENOUS | Status: DC
  Administered 2019-08-21 – 2019-08-22 (×3): via INTRAVENOUS

## 2019-08-21 MED ORDER — SODIUM CHLORIDE 0.9% FLUSH: 3.0000 mL | Freq: Once | INTRAVENOUS | Status: DC

## 2019-08-21 MED ORDER — SODIUM CHLORIDE 0.9 % IV SOLN
Freq: Once | INTRAVENOUS | Status: AC
  Administered 2019-08-21: 19:00:00 via INTRAVENOUS

## 2019-08-21 MED ORDER — DONEPEZIL HCL 5 MG PO TABS
5.0000 mg | ORAL_TABLET | Freq: Every day | ORAL | Status: DC
  Administered 2019-08-21 – 2019-08-27 (×7): 5 mg via ORAL
  Filled 2019-08-21 (×8): qty 1

## 2019-08-21 MED ORDER — SODIUM CHLORIDE 0.9 % IV SOLN
2.0000 g | Freq: Once | INTRAVENOUS | Status: AC
  Administered 2019-08-21: 2 g via INTRAVENOUS
  Filled 2019-08-21: qty 20

## 2019-08-21 MED ORDER — SENNOSIDES-DOCUSATE SODIUM 8.6-50 MG PO TABS
2.0000 | ORAL_TABLET | Freq: Every day | ORAL | Status: DC
  Administered 2019-08-21 – 2019-08-26 (×5): 2 via ORAL
  Filled 2019-08-21 (×6): qty 2

## 2019-08-21 MED ORDER — MELATONIN 5 MG PO TABS
5.0000 mg | ORAL_TABLET | Freq: Every day | ORAL | Status: DC
  Administered 2019-08-21 – 2019-08-27 (×7): 5 mg via ORAL
  Filled 2019-08-21 (×8): qty 1

## 2019-08-21 MED ORDER — SODIUM CHLORIDE 0.9 % IV BOLUS
1000.0000 mL | Freq: Once | INTRAVENOUS | Status: AC
  Administered 2019-08-21: 1000 mL via INTRAVENOUS

## 2019-08-21 NOTE — ED Provider Notes (Signed)
[facility] Emergency Department Provider Note  ____________________________________________   First MD Initiated Contact with Patient 08/21/19 1613     (approximate)  I have reviewed the triage vital signs and the nursing notes.   HISTORY  Chief Complaint Weakness    HPI Carlos Hayden. is a 85 y.o. male  With h/o dementia, prostate CA, HTN, AAA here with reported AMS. Per facility report, pt was found asleep today sitting down. He is normally responsive but had a difficult time waking up. He has a h/o recent recurrent falls. When EMS arrived, pt was back to hsi baseline. No recent med changes. On my assessment, pt drowsy but is able tot ell me he has no pain. Remainder of history limited 2/2 dementia.  Level 5 caveat invoked as remainder of history, ROS, and physical exam limited due to patient's dementia.         Past Medical History:  Diagnosis Date  . Cancer ([place]) 11/28/10   Prostate  . Dementia ([place])   . Hypertension     Patient Active Problem List   Diagnosis Date Noted  . Change in mental status 07/02/2018  . Acute viral syndrome 10/08/2017  . Thrombocytopenia ([place]) 07/07/2017  . Pancreatic mass 07/06/2017  . Aneurysm of infrarenal abdominal aorta (HCC) 07/06/2017  . Memory change 03/15/2017  . Unsteady gait 07/11/2014  . Essential hypertension 05/25/2008  . Hypercholesterolemia 04/14/2008  . History of prostate cancer 04/14/2008  . VASOVAGAL SYNCOPE 08/05/2007    Past Surgical History:  Procedure Laterality Date  . TONSILLECTOMY  1930's  . TOTAL KNEE ARTHROPLASTY     right    Prior to Admission medications   Medication Sig Start Date End Date Taking? Authorizing Provider  cyanocobalamin 1000 MCG tablet Take 1,000 mcg by mouth daily.    [provider]  donepezil (ARICEPT) 5 MG tablet TAKE 1 TABLET BY MOUTH ONCE DAILY AT BEDTIME Patient taking differently: Take 5 mg by mouth at bedtime.  04/02/19    Einar Pheasant, MD  lisinopril (PRINIVIL,ZESTRIL) 2.5 MG tablet TAKE 1 TABLET BY MOUTH ONCE PER DAY Patient not taking: Reported on 08/10/2019 05/26/18   Einar Pheasant, MD  Melatonin 3 MG TABS Take 3 mg by mouth at bedtime.    [provider]  QUEtiapine (SEROQUEL) 50 MG tablet Take 25-50 mg by mouth 2 (two) times daily. Take one-half tablet (25 mg) by mouth in the morning and one tablet (50mg ) at bedtime 07/14/19   [provider]  senna-docusate (SENOKOT S) 8.6-50 MG tablet Take 2 tablets by mouth daily.    [provider]  sertraline (ZOLOFT) 50 MG tablet Take 50 mg by mouth daily. 03/30/19   [provider]    Allergies Patient has no known allergies.  Family History  Problem Relation Age of Onset  . Prostate cancer Father   . Lung cancer Mother   . Cancer Daughter   . Stroke Daughter   . Heart attack Daughter     Social History Social History   Tobacco Use  . Smoking status: Former Research scientist (life sciences)  . Smokeless tobacco: Never Used  . Tobacco comment: QUIT SMOKING IN THE EARLY 50'S  Substance Use Topics  . Alcohol use: No    Alcohol/week: 0.0 standard drinks  . Drug use: No    Review of Systems  Review of Systems  Unable to perform ROS: Dementia     ____________________________________________  PHYSICAL EXAM:      VITAL SIGNS: ED Triage  Vitals  Enc Vitals Group     BP --      Pulse Rate 08/21/19 1547 61     Resp 08/21/19 1547 20     Temp 08/21/19 1547 99.1 F (37.3 C)     Temp Source 08/21/19 1547 Oral     SpO2 --      Weight 08/21/19 1548 151 lb 7.3 oz (68.7 kg)     Height 08/21/19 1548 5\' 9"  (1.753 m)     Head Circumference --      Peak Flow --      Pain Score --      Pain Loc --      Pain Edu? --      Excl. in [place]? --      Physical Exam Vitals signs and nursing note reviewed.  Constitutional:      Comments: Elderly, frail, easily falls asleep but is in no distress  HENT:     Head: Normocephalic and atraumatic.      Mouth/Throat:     Mouth: Mucous membranes are moist.  Eyes:     Pupils: Pupils are equal, round, and reactive to light.  Neck:     Musculoskeletal: Neck supple.  Cardiovascular:     Rate and Rhythm: Bradycardia present.     Pulses: Normal pulses.  Pulmonary:     Effort: Pulmonary effort is normal.  Abdominal:     General: Abdomen is flat.     Comments: Pulsatile mass noted. Non-tender.   Musculoskeletal:        General: No swelling.  Skin:    General: Skin is warm.     Capillary Refill: Capillary refill takes less than 2 seconds.  Neurological:     Mental Status: Mental status is at baseline. He is disoriented.       ____________________________________________   LABS (all labs ordered are listed, but only abnormal results are displayed)  Labs Reviewed  BASIC METABOLIC PANEL - Abnormal; Notable for the following components:      Result Value   Glucose, Bld 126 (*)    BUN 26 (*)    Calcium 8.5 (*)    All other components within normal limits  CBC - Abnormal; Notable for the following components:   RBC 4.19 (*)    HCT 37.8 (*)    All other components within normal limits  URINALYSIS, COMPLETE (UACMP) WITH MICROSCOPIC - Abnormal; Notable for the following components:   Color, Urine YELLOW (*)    APPearance CLEAR (*)    Hgb urine dipstick SMALL (*)    Ketones, ur 5 (*)    Protein, ur 30 (*)    Leukocytes,Ua MODERATE (*)    All other components within normal limits  URINE CULTURE  BLOOD GAS, VENOUS    ____________________________________________  EKG: Normal sinus rhythm, ventricular rate 65.  PR 167, QRS 88, QTc 410.  No acute ST or T-segment changes. ________________________________________  RADIOLOGY All imaging, including plain films, CT scans, and ultrasounds, independently reviewed by me, and interpretations confirmed via formal radiology reads.  ED MD interpretation:   CT Head: NAICA CXR: No PNA Abd U/S: 6.2 cm aortic aneurysm  Official radiology  report(s): Dg Chest 2 View  Result Date: 08/21/2019 CLINICAL DATA:  Weakness EXAM: CHEST - 2 VIEW COMPARISON:  10/08/2017 FINDINGS: COPD with hyperinflation and scarring. No acute infiltrate or effusion. Negative for heart failure. Atherosclerotic calcification aortic arch. IMPRESSION: COPD without acute cardiopulmonary abnormality. Electronically Signed   By: Franchot Gallo M.D.  On: 08/21/2019 17:50   Ct Head Wo Contrast  Result Date: 08/21/2019 CLINICAL DATA:  Altered level of consciousness (LOC), unexplained. EXAM: CT HEAD WITHOUT CONTRAST TECHNIQUE: Contiguous axial images were obtained from the base of the skull through the vertex without intravenous contrast. COMPARISON:  Head CT 08/15/2011 FINDINGS: Brain: No evidence of acute intracranial hemorrhage. No demarcated cortical infarction. No evidence of intracranial mass. No midline shift or extra-axial fluid collection. Stable generalized parenchymal atrophy and chronic small vessel ischemic disease. Redemonstrated prominent perivascular space versus small chronic lacunar infarct within the inferior left basal ganglia Vascular: No hyperdense vessel.  Atherosclerotic calcifications. Skull: Normal. Negative for fracture or focal lesion. Sinuses/Orbits: Visualized orbits demonstrate no acute abnormality. No significant paranasal sinus disease or mastoid effusion at the imaged levels. IMPRESSION: 1. No evidence of acute intracranial abnormality. 2. Stable generalized parenchymal atrophy and chronic small vessel ischemic disease. Electronically Signed   By: Kellie Simmering DO   On: 08/21/2019 17:53   US Aorta Duplex Limited  Result Date: 08/21/2019 CLINICAL DATA:  Pulsatile abdominal mass. EXAM: US ABDOMINAL AORTA MEDICARE SCREENING TECHNIQUE: Ultrasound examination of the abdominal aorta was performed as a screening evaluation for abdominal aortic aneurysm. COMPARISON:  CT abdomen pelvis, 06/26/2017. FINDINGS: Abdominal aortic measurements as follows:  (AP x transverse ) Proximal:  2.9 x 2.7 cm Mid:  2.7 x 2.7 cm Distal:  6.2 x 6.1 cm IMPRESSION: 1. 6.2 cm infrarenal abdominal aortic aneurysm, increased from 5 cm from the prior abdomen and pelvis CT. No sonographic evidence of rupture. Mural thrombus noted along the anterior aspect of the aneurysm. Vascular surgery consultation recommended due to increased risk of rupture for AAA >5.5 cm. This recommendation follows ACR consensus guidelines: White Paper of the ACR Incidental Findings Committee II on Vascular Findings. J Am Coll Radiol 2013; 10:789-794. Aortic aneurysm NOS (ICD10-I71.9) Electronically Signed   By: Lajean Manes M.D.   On: 08/21/2019 17:57    ____________________________________________  PROCEDURES   Procedure(s) performed (including Critical Care):  Procedures  ____________________________________________  INITIAL IMPRESSION / MDM / [place] / ED COURSE  As part of my medical decision making, I reviewed the following data within the [place] notes reviewed and incorporated, Old chart reviewed, Notes from prior ED visits, and [place] Controlled Substance Database       *Jaydenn Malina Dollie Steeg. was evaluated in Emergency Department on 08/21/2019 for the symptoms described in the history of present illness. He was evaluated in the context of the global COVID-19 pandemic, which necessitated consideration that the patient might be at risk for infection with the SARS-CoV-2 virus that causes COVID-19. Institutional protocols and algorithms that pertain to the evaluation of patients at risk for COVID-19 are in a state of rapid change based on information released by regulatory bodies including the CDC and federal and state organizations. These policies and algorithms were followed during the patient's care in the ED.  Some ED evaluations and interventions may be delayed as a result of limited staffing during the pandemic.*  Clinical Course as of  Aug 20 1825  Fri Aug 21, 2019  1647 Attempted to call daughter Daquante Callis without success   [CI]  1700 Attempted to call wife without success   [CI]  1755 Discussed with [facility] - has not been himself at all today. He said he was weak and felt lightheaded. Was found in another resident's room, unresponsive in a chair. Drowsy, not himself.    [CI]  1756  Called and discussed with daughter, who is HCPOA. She states she did not know about AAA, would like to d/w Vascular though agrees general anesthesia would not be reasonable.   [CI]    Clinical Course User Index [CI] Duffy Bruce, MD    Medical Decision Making:  85 yo M here with reported decreased LOC. He is drowsy here but otherwise is without complaints. Labs do show elevated BUN:CR ratio - will give gentle hydration. No leukocytosis. Hgb is stable. Will check basic imaging, plan to discuss with PCP and family if able. VBG sent. UA pending.   UA c/w UTI. Discussed with facility - pt has been significantly off his baseline today. They feel like this is an acute change. Will admit for hydration, ABX. Discussed with Dr. Delana Meyer as well with Vascular, will add to list. ____________________________________________  FINAL CLINICAL IMPRESSION(S) / ED DIAGNOSES  Final diagnoses:  Acute cystitis with hematuria  Weakness  Abdominal aortic aneurysm (AAA) greater than 5.5 cm in diameter in male [facility])     MEDICATIONS GIVEN DURING THIS VISIT:  Medications  sodium chloride flush (NS) 0.9 % injection 3 mL (3 mLs Intravenous Not Given 08/21/19 1613)  sodium chloride 0.9 % bolus 1,000 mL (1,000 mLs Intravenous New Bag/Given 08/21/19 1752)  cefTRIAXone (ROCEPHIN) 2 g in sodium chloride 0.9 % 100 mL IVPB (2 g Intravenous New Bag/Given 08/21/19 1751)     ED Discharge Orders    None       Note:  This document was prepared using Dragon voice recognition software and may include unintentional dictation errors.   Duffy Bruce, MD 08/21/19 [phone]

## 2019-08-21 NOTE — ED Triage Notes (Signed)
Pt arrival via [facility] from [facility] [place]. EMS states that the staff at [facility] said they found him in a chair unconscious and when they went to arouse him he didn't wake up. They called EMS, when EMS arrived he was wide awake.   EMS VS- -136/70, HR- 60, BS- 162

## 2019-08-21 NOTE — ED Notes (Signed)
Pt awake, denies needs, offered po fluids without taking a sip. Pt is currently dry. Attempted to remove pt's shirt earlier to change into a hospital gown. Pt became combative striking staff, so pt continues to wear personal shirt.

## 2019-08-21 NOTE — ED Notes (Signed)
Pt cleansed of incontinent urine. Posey fall pad placed under pt. DNR band placed on right wrist.

## 2019-08-21 NOTE — ED Notes (Signed)
Meds crushed and put in applesauce

## 2019-08-21 NOTE — H&P (Signed)
History and Physical    Carlos Hayden. [id number] DOB: Dec 03, 1925 DOA: 08/21/2019  PCP: Einar Pheasant, MD  Patient coming from: SNF   Chief Complaint: confusion  HPI: Carlos Hayden. is a 85 y.o. male with medical history significant for dementia, hypertension, prostate cancer in remission, htn, AAA, and bradycardia, presents with above.  Patient has advanced dementia and baseline per daughter is with limited communication skills. Unable to obtain history from him.  Per ED personnel, brought in by ambulance called by SNF for being found in a chair possibly unconscious. No witnessed trauma. Two ED visits this month for falls. Per conversation with daughter, she thinks EMS was called because of frequent falls and because his blood pressure was low. No reported fever or complaint of dysuria. No reported vomiting or diarrhea. No reported changes in meds (med list cannot be found in the ED). No reported illicit substance use. No reported chest pain.  ED Course: labs, imaging, ceftriaxone, vascular surgery consult.  Review of Systems: As per HPI otherwise 10 point review of systems negative.    Past Medical History:  Diagnosis Date   Cancer ([place]) 11/28/10   Prostate   Dementia ([place])    Hypertension     Past Surgical History:  Procedure Laterality Date   TONSILLECTOMY  1930's   TOTAL KNEE ARTHROPLASTY     right     reports that he has quit smoking. He has never used smokeless tobacco. He reports that he does not drink alcohol or use drugs.  No Known Allergies  Family History  Problem Relation Age of Onset   Prostate cancer Father    Lung cancer Mother    Cancer Daughter    Stroke Daughter    Heart attack Daughter     Prior to Admission medications   Medication Sig Start Date End Date Taking? Authorizing Provider  cyanocobalamin 1000 MCG tablet Take 1,000 mcg by mouth daily.   Yes [provider]  donepezil (ARICEPT) 5  MG tablet TAKE 1 TABLET BY MOUTH ONCE DAILY AT BEDTIME Patient taking differently: Take 5 mg by mouth at bedtime.  04/02/19  Yes Einar Pheasant, MD  LORazepam (ATIVAN) 0.5 MG tablet Take 0.5 mg by mouth daily as needed (agitation).   Yes [provider]  Melatonin 3 MG TABS Take 3 mg by mouth at bedtime.   Yes [provider]  QUEtiapine (SEROQUEL) 25 MG tablet Take 25 mg by mouth 2 (two) times daily. Can also have 0.5 tablet by mouth twice daily as needed for agitation 07/14/19  Yes [provider]  senna-docusate (SENOKOT S) 8.6-50 MG tablet Take 2 tablets by mouth daily.   Yes [provider]  sertraline (ZOLOFT) 50 MG tablet Take 50 mg by mouth daily. 03/30/19  Yes [provider]    Physical Exam: Vitals:   08/21/19 1631 08/21/19 1800 08/21/19 1830 08/21/19 1847  BP:  (!) 156/77 (!) 151/117 (!) 150/71  Pulse:  (!) 56 81 63  Resp:    20  Temp: 98.9 F (37.2 C)     TempSrc: Rectal     SpO2:  97% 96% 91%  Weight:      Height:        Constitutional: No acute distress. Chronically ill appearing Head: superficial laceration superior scalp Eyes: Conjunctiva clear ENM: dry mucous membranes. poordentition.  Neck: Supple Respiratory: Clear to auscultation bilaterally but with poor effort. no wheezing/rales/rhonchi. . No accessory muscle use. . Cardiovascular: Regular rate and  rhythm. Mod systolic murmur. Distant heart sounds Abdomen: Non-tender, non-distended. No masses. No rebound or guarding. Positive bowel sounds. Musculoskeletal: No joint deformity upper and lower extremities. Normal ROM. Decreased muscle tone throughout.  Skin: superficial laceration of scalp and anterior chins. Erythema sacrum. No active bleeding Extremities: No peripheral edema. Palpable peripheral pulses. Neurologic: Alert, moving all 4 extremities. Psychiatric: doesn't respond to questions.   Labs on Admission: I have personally reviewed following labs and imaging  studies  CBC: Recent Labs  Lab 08/15/19 1932 08/21/19 1612  WBC 5.4 10.0  NEUTROABS 3.2  --   HGB 13.4 13.5  HCT 39.8 37.8*  MCV 94.8 90.2  PLT 155 0000000   Basic Metabolic Panel: Recent Labs  Lab 08/15/19 1932 08/21/19 1612  NA 140 135  K 4.2 4.1  CL 105 102  CO2 26 23  GLUCOSE 107* 126*  BUN 24* 26*  CREATININE 0.78 0.62  CALCIUM 8.7* 8.5*   GFR: Estimated Creatinine Clearance: 58.4 mL/min (by C-G formula based on SCr of 0.62 mg/dL). Liver Function Tests: Recent Labs  Lab 08/15/19 1932  AST 16  ALT 12  ALKPHOS 65  BILITOT 0.5  PROT 6.6  ALBUMIN 3.8   No results for input(s): LIPASE, AMYLASE in the last 168 hours. No results for input(s): AMMONIA in the last 168 hours. Coagulation Profile: No results for input(s): INR, PROTIME in the last 168 hours. Cardiac Enzymes: No results for input(s): CKTOTAL, CKMB, CKMBINDEX, TROPONINI in the last 168 hours. BNP (last 3 results) No results for input(s): PROBNP in the last 8760 hours. HbA1C: No results for input(s): HGBA1C in the last 72 hours. CBG: No results for input(s): GLUCAP in the last 168 hours. Lipid Profile: No results for input(s): CHOL, HDL, LDLCALC, TRIG, CHOLHDL, LDLDIRECT in the last 72 hours. Thyroid Function Tests: No results for input(s): TSH, T4TOTAL, FREET4, T3FREE, THYROIDAB in the last 72 hours. Anemia Panel: No results for input(s): VITAMINB12, FOLATE, FERRITIN, TIBC, IRON, RETICCTPCT in the last 72 hours. Urine analysis:    Component Value Date/Time   COLORURINE YELLOW (A) 08/21/2019 1707   APPEARANCEUR CLEAR (A) 08/21/2019 1707   LABSPEC 1.020 08/21/2019 1707   PHURINE 6.0 08/21/2019 1707   GLUCOSEU NEGATIVE 08/21/2019 1707   GLUCOSEU NEGATIVE 07/02/2018 1025   HGBUR SMALL (A) 08/21/2019 1707   BILIRUBINUR NEGATIVE 08/21/2019 1707   KETONESUR 5 (A) 08/21/2019 1707   PROTEINUR 30 (A) 08/21/2019 1707   UROBILINOGEN 0.2 07/02/2018 1025   NITRITE NEGATIVE 08/21/2019 1707   LEUKOCYTESUR  MODERATE (A) 08/21/2019 1707    Radiological Exams on Admission: Dg Chest 2 View  Result Date: 08/21/2019 CLINICAL DATA:  Weakness EXAM: CHEST - 2 VIEW COMPARISON:  10/08/2017 FINDINGS: COPD with hyperinflation and scarring. No acute infiltrate or effusion. Negative for heart failure. Atherosclerotic calcification aortic arch. IMPRESSION: COPD without acute cardiopulmonary abnormality. Electronically Signed   By: Franchot Gallo M.D.   On: 08/21/2019 17:50   Ct Head Wo Contrast  Result Date: 08/21/2019 CLINICAL DATA:  Altered level of consciousness (LOC), unexplained. EXAM: CT HEAD WITHOUT CONTRAST TECHNIQUE: Contiguous axial images were obtained from the base of the skull through the vertex without intravenous contrast. COMPARISON:  Head CT 08/15/2011 FINDINGS: Brain: No evidence of acute intracranial hemorrhage. No demarcated cortical infarction. No evidence of intracranial mass. No midline shift or extra-axial fluid collection. Stable generalized parenchymal atrophy and chronic small vessel ischemic disease. Redemonstrated prominent perivascular space versus small chronic lacunar infarct within the inferior left basal ganglia Vascular: No  hyperdense vessel.  Atherosclerotic calcifications. Skull: Normal. Negative for fracture or focal lesion. Sinuses/Orbits: Visualized orbits demonstrate no acute abnormality. No significant paranasal sinus disease or mastoid effusion at the imaged levels. IMPRESSION: 1. No evidence of acute intracranial abnormality. 2. Stable generalized parenchymal atrophy and chronic small vessel ischemic disease. Electronically Signed   By: Kellie Simmering DO   On: 08/21/2019 17:53   US Aorta Duplex Limited  Result Date: 08/21/2019 CLINICAL DATA:  Pulsatile abdominal mass. EXAM: US ABDOMINAL AORTA MEDICARE SCREENING TECHNIQUE: Ultrasound examination of the abdominal aorta was performed as a screening evaluation for abdominal aortic aneurysm. COMPARISON:  CT abdomen pelvis,  06/26/2017. FINDINGS: Abdominal aortic measurements as follows: (AP x transverse ) Proximal:  2.9 x 2.7 cm Mid:  2.7 x 2.7 cm Distal:  6.2 x 6.1 cm IMPRESSION: 1. 6.2 cm infrarenal abdominal aortic aneurysm, increased from 5 cm from the prior abdomen and pelvis CT. No sonographic evidence of rupture. Mural thrombus noted along the anterior aspect of the aneurysm. Vascular surgery consultation recommended due to increased risk of rupture for AAA >5.5 cm. This recommendation follows ACR consensus guidelines: White Paper of the ACR Incidental Findings Committee II on Vascular Findings. J Am Coll Radiol 2013; 10:789-794. Aortic aneurysm NOS (ICD10-I71.9) Electronically Signed   By: Lajean Manes M.D.   On: 08/21/2019 17:57    EKG: Independently reviewed. Nsr, no ischemic changes, normal intervals  Assessment/Plan Principal Problem:   Acute metabolic encephalopathy Active Problems:   Essential hypertension   History of prostate cancer   Aneurysm of infrarenal abdominal aorta (HCC)   # Acute encephalopathy - etiology unclear. This may represent the waxing and waning nature of advanced dementia. ED has given ceftriaxone for UTI - if this is a symptomatic uti symptoms would be change in mental status, but I am not convinced this is uti as no bacteria seen (but does have blood and leuks). Not hypotensive as daughter thought. May be mildly dehydrated as small ketones in urine; kidney function appears to be at baseline, glucose mildly elevated to 126. Has had several falls lately; CT of head shows no acute changes, CXR with no acute findings. EKG grossly normal with normal rate (hx bradycardia, evaluated by cardiology in the past, thought to be asymptomatic), hgb normal, normal lliver function. Normal o2, no reported hx of toxic ingestions. - admit for observation - telemetry overnight - d51/2 ns @ 75 - f/u vbg, covid, urine culture (holding on further antibiotics right now) - pt/ot eval - delirium  precautions  # dementia, advanced - see above - continue home aricept, seroquel, zoloft, melatonin. Holding on prn lorazepam. Will need med rec as med list from SNF cannot be found  # Abdominal aortic aneurysm - hx of, 5 cm last ct, today 6.2 cm, no signs rupture. Vascular surgery consulted, will see patient in consult, but told EDP likely no intervention will be advised, at least acutely. - f/u vascular surgery recommendations  # Hypertension - here bp mild/moderately elevated.  - confirm home antihypertensive, if any. Appears may be taking lisinopril 2.5 daily  # history prostate cancer - from chart review appears last urology visit was several years ago - repeat psa  # stage 1 sacral decubitus ulcer - wound care  # Superficial lacerations of scalp, lower extremities - hemostatic, no signs infection - wound care     DVT prophylaxis: lovenox Code Status: dnr, confirmed w/ daughter  Family Communication: daughter  Disposition Plan: tbd  Consults called: vascular surgery  Admission status: obs tele    Desma Maxim MD [facility] Pager [phone]  If 7PM-7AM, please contact night-coverage [email or web address] Password [facility]  08/21/2019, 6:55 PM

## 2019-08-22 DIAGNOSIS — I10 Essential (primary) hypertension: Secondary | ICD-10-CM | POA: Diagnosis present

## 2019-08-22 DIAGNOSIS — W19XXXA Unspecified fall, initial encounter: Secondary | ICD-10-CM | POA: Diagnosis present

## 2019-08-22 DIAGNOSIS — L89151 Pressure ulcer of sacral region, stage 1: Secondary | ICD-10-CM | POA: Diagnosis present

## 2019-08-22 DIAGNOSIS — Z8249 Family history of ischemic heart disease and other diseases of the circulatory system: Secondary | ICD-10-CM | POA: Diagnosis not present

## 2019-08-22 DIAGNOSIS — Z8546 Personal history of malignant neoplasm of prostate: Secondary | ICD-10-CM | POA: Diagnosis not present

## 2019-08-22 DIAGNOSIS — I714 Abdominal aortic aneurysm, without rupture: Secondary | ICD-10-CM | POA: Diagnosis present

## 2019-08-22 DIAGNOSIS — Z515 Encounter for palliative care: Secondary | ICD-10-CM | POA: Diagnosis not present

## 2019-08-22 DIAGNOSIS — Z79890 Hormone replacement therapy (postmenopausal): Secondary | ICD-10-CM | POA: Diagnosis not present

## 2019-08-22 DIAGNOSIS — N3001 Acute cystitis with hematuria: Secondary | ICD-10-CM | POA: Diagnosis not present

## 2019-08-22 DIAGNOSIS — S81811A Laceration without foreign body, right lower leg, initial encounter: Secondary | ICD-10-CM | POA: Diagnosis present

## 2019-08-22 DIAGNOSIS — Z87891 Personal history of nicotine dependence: Secondary | ICD-10-CM | POA: Diagnosis not present

## 2019-08-22 DIAGNOSIS — F0390 Unspecified dementia without behavioral disturbance: Secondary | ICD-10-CM | POA: Diagnosis present

## 2019-08-22 DIAGNOSIS — Z79899 Other long term (current) drug therapy: Secondary | ICD-10-CM | POA: Diagnosis not present

## 2019-08-22 DIAGNOSIS — S81812A Laceration without foreign body, left lower leg, initial encounter: Secondary | ICD-10-CM | POA: Diagnosis present

## 2019-08-22 DIAGNOSIS — R1900 Intra-abdominal and pelvic swelling, mass and lump, unspecified site: Secondary | ICD-10-CM | POA: Diagnosis not present

## 2019-08-22 DIAGNOSIS — S0101XA Laceration without foreign body of scalp, initial encounter: Secondary | ICD-10-CM | POA: Diagnosis present

## 2019-08-22 DIAGNOSIS — R531 Weakness: Secondary | ICD-10-CM | POA: Diagnosis present

## 2019-08-22 DIAGNOSIS — R4182 Altered mental status, unspecified: Secondary | ICD-10-CM | POA: Diagnosis present

## 2019-08-22 DIAGNOSIS — R410 Disorientation, unspecified: Secondary | ICD-10-CM | POA: Diagnosis not present

## 2019-08-22 DIAGNOSIS — Z66 Do not resuscitate: Secondary | ICD-10-CM | POA: Diagnosis present

## 2019-08-22 DIAGNOSIS — Z8042 Family history of malignant neoplasm of prostate: Secondary | ICD-10-CM | POA: Diagnosis not present

## 2019-08-22 DIAGNOSIS — Z20828 Contact with and (suspected) exposure to other viral communicable diseases: Secondary | ICD-10-CM | POA: Diagnosis present

## 2019-08-22 DIAGNOSIS — G9341 Metabolic encephalopathy: Secondary | ICD-10-CM | POA: Diagnosis present

## 2019-08-22 DIAGNOSIS — E860 Dehydration: Secondary | ICD-10-CM | POA: Diagnosis present

## 2019-08-22 DIAGNOSIS — R296 Repeated falls: Secondary | ICD-10-CM | POA: Diagnosis present

## 2019-08-22 LAB — HEMOGLOBIN A1C
Hgb A1c MFr Bld: 5.3 % (ref 4.8–5.6)
Mean Plasma Glucose: 105.41 mg/dL

## 2019-08-22 LAB — PSA: Prostatic Specific Antigen: 0.04 ng/mL (ref 0.00–4.00)

## 2019-08-22 LAB — SARS CORONAVIRUS 2 (TAT 6-24 HRS): SARS Coronavirus 2: NEGATIVE

## 2019-08-22 MED ORDER — LORAZEPAM 2 MG/ML IJ SOLN
1.0000 mg | Freq: Once | INTRAMUSCULAR | Status: AC
  Administered 2019-08-22: 1 mg via INTRAVENOUS
  Filled 2019-08-22: qty 1

## 2019-08-22 NOTE — Progress Notes (Addendum)
PROGRESS NOTE    [place].  [id number] DOB: 1926/09/02 DOA: 08/21/2019  PCP: Einar Pheasant, MD    LOS - 0   Brief Narrative:  85 y.o. male with medical history significant for advanced dementia, hypertension, prostate cancer in remission, htn, AAA, and bradycardia, presents after being found at SNF unresponsive in a chair.  Two prior ED visits for falls this month.   Per report, patient has had no fevers or respiratory symptoms points of dysuria, no vomiting or diarrhea.  No recent med changes.  In the ED, patient afebrile bradycardic in 50s and mildly hypertensive.  Labs are unremarkable.  UA showed moderate LE, 11-20 WBCs, bacteria.  Treated with Rocephin and urine culture pending.  CT of head negative.  Chest x-ray negative.  Pulsatile abdominal mass noted on exam, therefore ultrasound was obtained and showed 6.2 cm abdominal aortic aneurysm up from 5 cm previously.  Vascular surgery was consulted and recommended no intervention given patient's advanced age with dementia and high risk for morbidity mortality with any intervention.  Patient was admitted for observation telemetry and IV hydration.  Holding further antibiotics pending urine culture.   Subjective 11/21: Patient seen and examined awake in bed.  Noncommunicative when I ask him questions.  RN in room to give meds, which patient refused.  Reports a significant agitation and combativeness overnight.  No other acute events reported.  Assessment & Plan:   Principal Problem:   Acute metabolic encephalopathy Active Problems:   Essential hypertension   History of prostate cancer   Aneurysm of infrarenal abdominal aorta (HCC)   Acute encephalopathy - unclear etiology.  Suspect this is secondary to advanced dementia with waxing and waning periods of delirium.  Patient afebrile and without leukocytosis, doubt this is infectious.  Covid negative.  No acute findings on head CT obtained due to recurrent falls  recently. -Hold antibiotics for now -Follow-up urine culture and treat if positive -Telemetry monitoring -Delirium precautions -PT OT -Continue gentle IV hydration  Dementia, advanced -Continue home Aricept, Seroquel, Zoloft, melatonin -Hold as needed Ativan -Follow-up med list from SNF to confirm current meds  Recurrent falls -likely secondary to overall deconditioning of end-stage dementia -PT OT -Fall precautions  Abdominal aortic aneurysm Previously 5 cm, now 6.2 per ultrasound obtained on admission after abdominal mass was palpated on exam.  Vascular surgery was consulted and recommended no intervention due to patient's advanced age and advanced dementia, has high risk of significant morbidity mortality.  Hypertension -chronic -We will treat with as needed medications for now pending current med list from SNF  Stage I sacral decubitus ulcer -present on admission.  Wound care.  Superficial lacerations of the scalp, lower extremities.  Due to recent falls.  Not bleeding and no signs of infection.  Wound care.   DVT prophylaxis: Lovenox   Code Status: DNR  Family Communication: None at bedside Disposition Plan: d/c to SNF likely tomorrow if medically stable   Consultants:   Vascular surgery  Procedures:   Aortic Doppler ultrasound 11/20  Antimicrobials:   None  1 g Rocephin given in ED   Objective: Vitals:   08/22/19 0100 08/22/19 0335 08/22/19 0805 08/22/19 0806  BP:  103/89 (!) 153/84   Pulse: (!) 50 (!) 101 (!) 102   Resp: 15 19 (!) 21   Temp:  99 F (37.2 C)  98.5 F (36.9 C)  TempSrc:  Oral  Oral  SpO2: 97% 93% 90%   Weight:  72.1 kg  Height:        Intake/Output Summary (Last 24 hours) at 08/22/2019 1528 Last data filed at 08/22/2019 0900 Gross per 24 hour  Intake 0 ml  Output -  Net 0 ml   Filed Weights   08/21/19 1548 08/22/19 0335  Weight: 68.7 kg 72.1 kg    Examination:  General exam: awake, alert, no acute distres,  disheveled s Respiratory system: clear to auscultation bilaterally, no wheezes, rales or rhonchi, normal respiratory effort. Cardiovascular system: normal S1/S2, RRR, no pedal edema.   Gastrointestinal system: soft, non-tender, non-distended abdomen Central nervous system: alert, disoriented, no gross focal neurologic deficits, normal speech Extremities: moves all, no edema, normal tone Skin skin: Superficial lacerations to scalp and anterior shins bilaterally, dry, normal temperature Psychiatry: Unable to evaluate due to dementia, mildly agitated  Data Reviewed: I have personally reviewed following labs and imaging studies  CBC: Recent Labs  Lab 08/15/19 1932 08/21/19 1612  WBC 5.4 10.0  NEUTROABS 3.2  --   HGB 13.4 13.5  HCT 39.8 37.8*  MCV 94.8 90.2  PLT 155 0000000   Basic Metabolic Panel: Recent Labs  Lab 08/15/19 1932 08/21/19 1612  NA 140 135  K 4.2 4.1  CL 105 102  CO2 26 23  GLUCOSE 107* 126*  BUN 24* 26*  CREATININE 0.78 0.62  CALCIUM 8.7* 8.5*   GFR: Estimated Creatinine Clearance: 60.1 mL/min (by C-G formula based on SCr of 0.62 mg/dL). Liver Function Tests: Recent Labs  Lab 08/15/19 1932  AST 16  ALT 12  ALKPHOS 65  BILITOT 0.5  PROT 6.6  ALBUMIN 3.8   No results for input(s): LIPASE, AMYLASE in the last 168 hours. No results for input(s): AMMONIA in the last 168 hours. Coagulation Profile: No results for input(s): INR, PROTIME in the last 168 hours. Cardiac Enzymes: No results for input(s): CKTOTAL, CKMB, CKMBINDEX, TROPONINI in the last 168 hours. BNP (last 3 results) No results for input(s): PROBNP in the last 8760 hours. HbA1C: Recent Labs    08/22/19 0528  HGBA1C 5.3   CBG: No results for input(s): GLUCAP in the last 168 hours. Lipid Profile: No results for input(s): CHOL, HDL, LDLCALC, TRIG, CHOLHDL, LDLDIRECT in the last 72 hours. Thyroid Function Tests: No results for input(s): TSH, T4TOTAL, FREET4, T3FREE, THYROIDAB in the last 72  hours. Anemia Panel: No results for input(s): VITAMINB12, FOLATE, FERRITIN, TIBC, IRON, RETICCTPCT in the last 72 hours. Sepsis Labs: No results for input(s): PROCALCITON, LATICACIDVEN in the last 168 hours.  Recent Results (from the past 240 hour(s))  SARS CORONAVIRUS 2 (TAT 6-24 HRS) Nasopharyngeal Nasopharyngeal Swab     Status: None   Collection Time: 08/21/19  6:45 PM   Specimen: Nasopharyngeal Swab  Result Value Ref Range Status   SARS Coronavirus 2 NEGATIVE NEGATIVE Final    Comment: (NOTE) SARS-CoV-2 target nucleic acids are NOT DETECTED. The SARS-CoV-2 RNA is generally detectable in upper and lower respiratory specimens during the acute phase of infection. Negative results do not preclude SARS-CoV-2 infection, do not rule out co-infections with other pathogens, and should not be used as the sole basis for treatment or other patient management decisions. Negative results must be combined with clinical observations, patient history, and epidemiological information. The expected result is Negative. Fact Sheet for Patients: SugarRoll.be Fact Sheet for Healthcare Providers: https://www.woods-mathews.com/ This test is not yet approved or cleared by the [place] FDA and  has been authorized for detection and/or diagnosis of SARS-CoV-2 by FDA under an Emergency  Use Authorization (EUA). This EUA will remain  in effect (meaning this test can be used) for the duration of the COVID-19 declaration under Section 56 4(b)(1) of the Act, 21 U.S.C. section 360bbb-3(b)(1), unless the authorization is terminated or revoked sooner. Performed at [facility], [place] [address] [place],  [address]          Radiology Studies: Dg Chest 2 View  Result Date: 08/21/2019 CLINICAL DATA:  Weakness EXAM: CHEST - 2 VIEW COMPARISON:  10/08/2017 FINDINGS: COPD with hyperinflation and scarring. No acute infiltrate or effusion. Negative for  heart failure. Atherosclerotic calcification aortic arch. IMPRESSION: COPD without acute cardiopulmonary abnormality. Electronically Signed   By: Franchot Gallo M.D.   On: 08/21/2019 17:50   Ct Head Wo Contrast  Result Date: 08/21/2019 CLINICAL DATA:  Altered level of consciousness (LOC), unexplained. EXAM: CT HEAD WITHOUT CONTRAST TECHNIQUE: Contiguous axial images were obtained from the base of the skull through the vertex without intravenous contrast. COMPARISON:  Head CT 08/15/2011 FINDINGS: Brain: No evidence of acute intracranial hemorrhage. No demarcated cortical infarction. No evidence of intracranial mass. No midline shift or extra-axial fluid collection. Stable generalized parenchymal atrophy and chronic small vessel ischemic disease. Redemonstrated prominent perivascular space versus small chronic lacunar infarct within the inferior left basal ganglia Vascular: No hyperdense vessel.  Atherosclerotic calcifications. Skull: Normal. Negative for fracture or focal lesion. Sinuses/Orbits: Visualized orbits demonstrate no acute abnormality. No significant paranasal sinus disease or mastoid effusion at the imaged levels. IMPRESSION: 1. No evidence of acute intracranial abnormality. 2. Stable generalized parenchymal atrophy and chronic small vessel ischemic disease. Electronically Signed   By: Kellie Simmering DO   On: 08/21/2019 17:53   US Aorta Duplex Limited  Result Date: 08/21/2019 CLINICAL DATA:  Pulsatile abdominal mass. EXAM: US ABDOMINAL AORTA MEDICARE SCREENING TECHNIQUE: Ultrasound examination of the abdominal aorta was performed as a screening evaluation for abdominal aortic aneurysm. COMPARISON:  CT abdomen pelvis, 06/26/2017. FINDINGS: Abdominal aortic measurements as follows: (AP x transverse ) Proximal:  2.9 x 2.7 cm Mid:  2.7 x 2.7 cm Distal:  6.2 x 6.1 cm IMPRESSION: 1. 6.2 cm infrarenal abdominal aortic aneurysm, increased from 5 cm from the prior abdomen and pelvis CT. No sonographic  evidence of rupture. Mural thrombus noted along the anterior aspect of the aneurysm. Vascular surgery consultation recommended due to increased risk of rupture for AAA >5.5 cm. This recommendation follows ACR consensus guidelines: White Paper of the ACR Incidental Findings Committee II on Vascular Findings. J Am Coll Radiol 2013; 10:789-794. Aortic aneurysm NOS (ICD10-I71.9) Electronically Signed   By: Lajean Manes M.D.   On: 08/21/2019 17:57        Scheduled Meds: . donepezil  5 mg Oral QHS  . enoxaparin (LOVENOX) injection  40 mg Subcutaneous Q24H  . Melatonin  5 mg Oral QHS  . QUEtiapine  25 mg Oral BID  . senna-docusate  2 tablet Oral Daily  . sertraline  50 mg Oral Daily   Continuous Infusions: . dextrose 5 % and 0.45% NaCl 75 mL/hr at 08/22/19 0951     LOS: 0 days    Time spent: 30-35 minutes    Ezekiel Slocumb, DO [facility] Pager: [phone]  If 7PM-7AM, please contact night-coverage [email or web address] Password [facility] 08/22/2019, 3:28 PM

## 2019-08-22 NOTE — Plan of Care (Signed)
  Problem: Safety: Goal: Ability to remain free from injury will improve Outcome: Progressing   

## 2019-08-22 NOTE — Consult Note (Signed)
Reason for Consult:Asymptomatic AAA Referring Physician: Dr. Acie Fredrickson Carlos Hayden. is an 85 y.o. male.  HPI: Patient with know AAA. Last known at 5.0cm. Significant dementia, HTN. Presented to ED after reportedly being found unconscious at the nursing home. Found to have a UTI. Subsequent imaging revealed AAA 6.2 cm- no evidence of rupture or infl  Past Medical History:  Diagnosis Date  . Cancer ([place]) 11/28/10   Prostate  . Dementia ([place])   . Hypertension     Past Surgical History:  Procedure Laterality Date  . TONSILLECTOMY  1930's  . TOTAL KNEE ARTHROPLASTY     right    Family History  Problem Relation Age of Onset  . Prostate cancer Father   . Lung cancer Mother   . Cancer Daughter   . Stroke Daughter   . Heart attack Daughter     Social History:  reports that he has quit smoking. He has never used smokeless tobacco. He reports that he does not drink alcohol or use drugs.  Allergies: No Known Allergies  Medications: I have reviewed the patient's current medications.  Results for orders placed or performed during the hospital encounter of 08/21/19 (from the past 48 hour(s))  Basic metabolic panel     Status: Abnormal   Collection Time: 08/21/19  4:12 PM  Result Value Ref Range   Sodium 135 135 - 145 mmol/L   Potassium 4.1 3.5 - 5.1 mmol/L   Chloride 102 98 - 111 mmol/L   CO2 23 22 - 32 mmol/L   Glucose, Bld 126 (H) 70 - 99 mg/dL   BUN 26 (H) 8 - 23 mg/dL   Creatinine, Ser 0.62 0.61 - 1.24 mg/dL   Calcium 8.5 (L) 8.9 - 10.3 mg/dL   GFR calc non Af Amer >60 >60 mL/min   GFR calc Af Amer >60 >60 mL/min   Anion gap 10 5 - 15    Comment: Performed at [facility], [address] [place], [place] [address]  CBC     Status: Abnormal   Collection Time: 08/21/19  4:12 PM  Result Value Ref Range   WBC 10.0 4.0 - 10.5 K/uL   RBC 4.19 (L) 4.22 - 5.81 MIL/uL   Hemoglobin 13.5 13.0 - 17.0 g/dL   HCT 37.8 (L) 39.0 - 52.0 %   MCV 90.2 80.0 -  100.0 fL   MCH 32.2 26.0 - 34.0 pg   MCHC 35.7 30.0 - 36.0 g/dL   RDW 12.0 11.5 - 15.5 %   Platelets 153 150 - 400 K/uL   nRBC 0.0 0.0 - 0.2 %    Comment: Performed at [facility], [address] [place], [place] [address]  Urinalysis, Complete w Microscopic     Status: Abnormal   Collection Time: 08/21/19  5:07 PM  Result Value Ref Range   Color, Urine YELLOW (A) YELLOW   APPearance CLEAR (A) CLEAR   Specific Gravity, Urine 1.020 1.005 - 1.030   pH 6.0 5.0 - 8.0   Glucose, UA NEGATIVE NEGATIVE mg/dL   Hgb urine dipstick SMALL (A) NEGATIVE   Bilirubin Urine NEGATIVE NEGATIVE   Ketones, ur 5 (A) NEGATIVE mg/dL   Protein, ur 30 (A) NEGATIVE mg/dL   Nitrite NEGATIVE NEGATIVE   Leukocytes,Ua MODERATE (A) NEGATIVE   RBC / HPF 11-20 0 - 5 RBC/hpf   WBC, UA 11-20 0 - 5 WBC/hpf   Bacteria, UA NONE SEEN NONE SEEN   Squamous Epithelial / LPF NONE SEEN  0 - 5   Mucus PRESENT     Comment: Performed at [facility], [address] [place], [place] [address]  Blood gas, venous     Status: Abnormal   Collection Time: 08/21/19  5:08 PM  Result Value Ref Range   pH, Ven 7.38 7.250 - 7.430   pCO2, Ven 51 44.0 - 60.0 mmHg   Bicarbonate 30.2 (H) 20.0 - 28.0 mmol/L   Acid-Base Excess 3.9 (H) 0.0 - 2.0 mmol/L   O2 Saturation 51.2 %   Patient temperature 37.0    Collection site VEIN    Sample type VENIPUNCTURE     Comment: Performed at [facility], [address] [place], [place] [address]  SARS CORONAVIRUS 2 (TAT 6-24 HRS) Nasopharyngeal Nasopharyngeal Swab     Status: None   Collection Time: 08/21/19  6:45 PM   Specimen: Nasopharyngeal Swab  Result Value Ref Range   SARS Coronavirus 2 NEGATIVE NEGATIVE    Comment: (NOTE) SARS-CoV-2 target nucleic acids are NOT DETECTED. The SARS-CoV-2 RNA is generally detectable in upper and lower respiratory specimens during the acute phase of infection. Negative results do not preclude SARS-CoV-2 infection, do not rule  out co-infections with other pathogens, and should not be used as the sole basis for treatment or other patient management decisions. Negative results must be combined with clinical observations, patient history, and epidemiological information. The expected result is Negative. Fact Sheet for Patients: SugarRoll.be Fact Sheet for Healthcare Providers: https://www.woods-mathews.com/ This test is not yet approved or cleared by the [place] FDA and  has been authorized for detection and/or diagnosis of SARS-CoV-2 by FDA under an Emergency Use Authorization (EUA). This EUA will remain  in effect (meaning this test can be used) for the duration of the COVID-19 declaration under Section 56 4(b)(1) of the Act, 21 U.S.C. section 360bbb-3(b)(1), unless the authorization is terminated or revoked sooner. Performed at [facility], [address], [place], [place] [address]   Hemoglobin A1c     Status: None   Collection Time: 08/22/19  5:28 AM  Result Value Ref Range   Hgb A1c MFr Bld 5.3 4.8 - 5.6 %    Comment: (NOTE) Pre diabetes:          5.7%-6.4% Diabetes:              >6.4% Glycemic control for   <7.0% adults with diabetes    Mean Plasma Glucose 105.41 mg/dL    Comment: Performed at [facility] [address] [place], [place] [address]    Dg Chest 2 View  Result Date: 08/21/2019 CLINICAL DATA:  Weakness EXAM: CHEST - 2 VIEW COMPARISON:  10/08/2017 FINDINGS: COPD with hyperinflation and scarring. No acute infiltrate or effusion. Negative for heart failure. Atherosclerotic calcification aortic arch. IMPRESSION: COPD without acute cardiopulmonary abnormality. Electronically Signed   By: Franchot Gallo M.D.   On: 08/21/2019 17:50   Ct Head Wo Contrast  Result Date: 08/21/2019 CLINICAL DATA:  Altered level of consciousness (LOC), unexplained. EXAM: CT HEAD WITHOUT CONTRAST TECHNIQUE: Contiguous axial images were obtained from  the base of the skull through the vertex without intravenous contrast. COMPARISON:  Head CT 08/15/2011 FINDINGS: Brain: No evidence of acute intracranial hemorrhage. No demarcated cortical infarction. No evidence of intracranial mass. No midline shift or extra-axial fluid collection. Stable generalized parenchymal atrophy and chronic small vessel ischemic disease. Redemonstrated prominent perivascular space versus small chronic lacunar infarct within the inferior left basal ganglia Vascular: No  hyperdense vessel.  Atherosclerotic calcifications. Skull: Normal. Negative for fracture or focal lesion. Sinuses/Orbits: Visualized orbits demonstrate no acute abnormality. No significant paranasal sinus disease or mastoid effusion at the imaged levels. IMPRESSION: 1. No evidence of acute intracranial abnormality. 2. Stable generalized parenchymal atrophy and chronic small vessel ischemic disease. Electronically Signed   By: Kellie Simmering DO   On: 08/21/2019 17:53   US Aorta Duplex Limited  Result Date: 08/21/2019 CLINICAL DATA:  Pulsatile abdominal mass. EXAM: US ABDOMINAL AORTA MEDICARE SCREENING TECHNIQUE: Ultrasound examination of the abdominal aorta was performed as a screening evaluation for abdominal aortic aneurysm. COMPARISON:  CT abdomen pelvis, 06/26/2017. FINDINGS: Abdominal aortic measurements as follows: (AP x transverse ) Proximal:  2.9 x 2.7 cm Mid:  2.7 x 2.7 cm Distal:  6.2 x 6.1 cm IMPRESSION: 1. 6.2 cm infrarenal abdominal aortic aneurysm, increased from 5 cm from the prior abdomen and pelvis CT. No sonographic evidence of rupture. Mural thrombus noted along the anterior aspect of the aneurysm. Vascular surgery consultation recommended due to increased risk of rupture for AAA >5.5 cm. This recommendation follows ACR consensus guidelines: White Paper of the ACR Incidental Findings Committee II on Vascular Findings. J Am Coll Radiol 2013; 10:789-794. Aortic aneurysm NOS (ICD10-I71.9) Electronically  Signed   By: Lajean Manes M.D.   On: 08/21/2019 17:57    Review of Systems  Unable to perform ROS: Dementia   Blood pressure (!) 153/84, pulse (!) 102, temperature 98.5 F (36.9 C), temperature source Oral, resp. rate (!) 21, height 5\' 9"  (1.753 m), weight 72.1 kg, SpO2 90 %. Physical Exam  Nursing note and vitals reviewed. Constitutional: He appears well-developed.  Cardiovascular: Normal rate, regular rhythm and intact distal pulses.  Palpable femoral pulses  Respiratory: Effort normal and breath sounds normal. No respiratory distress.  GI: Soft. Bowel sounds are normal. He exhibits no distension. There is no abdominal tenderness.  Palpable abdominal aorta  Musculoskeletal:        General: No edema.  Skin: Skin is warm. No erythema.    Assessment/Plan: Asymptomatic, nonruptured 6.2cm AAA  Multiple attempts to speak with daughter via phone- calls  Martin Majestic to voicemail.  The patient has 10-20% chance of rupture/yr at the current size. However, with the patients severe dementia, overall clinical status and age intervention is not recommended. Intervention may not only pose significant morbidity and mortality including renal and cardiac ramifications. The longterm prognosis may be poor.  Please call for further questions/concerns.  Seven [place] A 08/22/2019, 9:34 AM

## 2019-08-22 NOTE — TOC Initial Note (Signed)
Transition of Care (TOC) - Initial/Assessment Note    Patient Details  Name: Carlos Hayden. MRN: [id number] Date of Birth: 11/19/1925  Transition of Care [facility]) CM/SW Contact:    Marshell Garfinkel, RN Phone Number: 08/22/2019, 8:55 AM  Clinical Narrative:                 Patient is from [facility] assisted living facility. THis is 3 presentation to hospital. I have left message for daughter Donnelle Mincy [phone] to call this RNCM to discuss [facility] letter. Patient is confused.         Patient Goals and CMS Choice        Expected Discharge Plan and Services                                                Prior Living Arrangements/Services                       Activities of Daily Living      Permission Sought/Granted                  Emotional Assessment              Admission diagnosis:  Weakness [R53.1] Acute cystitis with hematuria [N30.01] Pulsatile abdominal mass [R19.00] Abdominal aortic aneurysm (AAA) greater than 5.5 cm in diameter in male [facility]) [I71.4] Patient Active Problem List   Diagnosis Date Noted  . Acute metabolic encephalopathy [id number]  . Change in mental status 07/02/2018  . Acute viral syndrome 10/08/2017  . Thrombocytopenia ([place]) 07/07/2017  . Pancreatic mass 07/06/2017  . Aneurysm of infrarenal abdominal aorta (HCC) 07/06/2017  . Memory change 03/15/2017  . Unsteady gait 07/11/2014  . Essential hypertension 05/25/2008  . Hypercholesterolemia 04/14/2008  . History of prostate cancer 04/14/2008  . VASOVAGAL SYNCOPE 08/05/2007   PCP:  Einar Pheasant, MD Pharmacy:   [facility], [place] [address] Phone: [phone] Fax: [place], [place] [address] [place] [address] Phone: [phone] Fax: [place],  [place] [place] [address] Phone: [phone] Fax: [phone]     Social Determinants of Health (SDOH) Interventions    Readmission Risk Interventions No flowsheet data found.

## 2019-08-22 NOTE — Progress Notes (Signed)
PT Cancellation Note  Patient Details Name: Carlos Hayden. MRN: [id number] DOB: 05/03/1926   Cancelled Treatment:    Reason Eval/Treat Not Completed: Patient declined, no reason specified. Pt is very lethargic, per nsg was medically safe to attempt but will not engage with PT in conversation or try to move.  Retry at another time.   Ramond Dial 08/22/2019, 6:14 PM   Mee Hives, PT MS Acute Rehab Dept. Number: [place] and [place]

## 2019-08-22 NOTE — Progress Notes (Signed)
Pt up to floor.trying to get from stretcher to bed, pt began to become agitated,combative. Unable to get vital signs at the present time.

## 2019-08-22 NOTE — ED Notes (Signed)
ED TO INPATIENT HANDOFF REPORT  ED Nurse Name and Phone #: [phone]   S Name/Age/Gender Carlos Hayden. 85 y.o. male Room/Bed: ED26A/ED26A  Code Status   Code Status: DNR  Home/SNF/Other Nursing Home Patient oriented to: self Is this baseline? Yes   Triage Complete: Triage complete  Chief Complaint syncope ems  Triage Note Pt arrival via [facility] from [facility]. EMS states that the staff at [facility] said they found him in a chair unconscious and when they went to arouse him he didn't wake up. They called EMS, when EMS arrived he was wide awake.   EMS VS- -136/70, HR- 60, BS- 162     Allergies No Known Allergies  Level of Care/Admitting Diagnosis ED Disposition    ED Disposition Condition [place]: [place] [100120]  Level of Care: Med-Surg [16]  Covid Evaluation: Asymptomatic Screening Protocol (No Symptoms)  Diagnosis: Acute metabolic encephalopathy [id number]  Admitting Physician: Eston Esters  Attending Physician: [place], [place] [id number]  PT Class (Do Not Modify): Observation [104]  PT Acc Code (Do Not Modify): Observation [10022]       B Medical/Surgery History Past Medical History:  Diagnosis Date  . Cancer ([place]) 11/28/10   Prostate  . Dementia ([place])   . Hypertension    Past Surgical History:  Procedure Laterality Date  . TONSILLECTOMY  1930's  . TOTAL KNEE ARTHROPLASTY     right     A IV Location/Drains/Wounds Patient Lines/Drains/Airways Status   Active Line/Drains/Airways    Name:   Placement date:   Placement time:   Site:   Days:   Peripheral IV 08/21/19 Right Wrist   08/21/19    1613    Wrist   1          Intake/Output Last 24 hours No intake or output data in the 24 hours ending 08/22/19 0051  Labs/Imaging Results for orders placed or performed during the hospital encounter of 08/21/19 (from the past 48 hour(s))  Basic  metabolic panel     Status: Abnormal   Collection Time: 08/21/19  4:12 PM  Result Value Ref Range   Sodium 135 135 - 145 mmol/L   Potassium 4.1 3.5 - 5.1 mmol/L   Chloride 102 98 - 111 mmol/L   CO2 23 22 - 32 mmol/L   Glucose, Bld 126 (H) 70 - 99 mg/dL   BUN 26 (H) 8 - 23 mg/dL   Creatinine, Ser 0.62 0.61 - 1.24 mg/dL   Calcium 8.5 (L) 8.9 - 10.3 mg/dL   GFR calc non Af Amer >60 >60 mL/min   GFR calc Af Amer >60 >60 mL/min   Anion gap 10 5 - 15    Comment: Performed at [facility], [address] [place], [place] [address]  CBC     Status: Abnormal   Collection Time: 08/21/19  4:12 PM  Result Value Ref Range   WBC 10.0 4.0 - 10.5 K/uL   RBC 4.19 (L) 4.22 - 5.81 MIL/uL   Hemoglobin 13.5 13.0 - 17.0 g/dL   HCT 37.8 (L) 39.0 - 52.0 %   MCV 90.2 80.0 - 100.0 fL   MCH 32.2 26.0 - 34.0 pg   MCHC 35.7 30.0 - 36.0 g/dL   RDW 12.0 11.5 - 15.5 %   Platelets 153 150 - 400 K/uL   nRBC 0.0 0.0 - 0.2 %    Comment: Performed at [facility], [address] [place], [place] [address]  Urinalysis, Complete w Microscopic     Status: Abnormal   Collection Time: 08/21/19  5:07 PM  Result Value Ref Range   Color, Urine YELLOW (A) YELLOW   APPearance CLEAR (A) CLEAR   Specific Gravity, Urine 1.020 1.005 - 1.030   pH 6.0 5.0 - 8.0   Glucose, UA NEGATIVE NEGATIVE mg/dL   Hgb urine dipstick SMALL (A) NEGATIVE   Bilirubin Urine NEGATIVE NEGATIVE   Ketones, ur 5 (A) NEGATIVE mg/dL   Protein, ur 30 (A) NEGATIVE mg/dL   Nitrite NEGATIVE NEGATIVE   Leukocytes,Ua MODERATE (A) NEGATIVE   RBC / HPF 11-20 0 - 5 RBC/hpf   WBC, UA 11-20 0 - 5 WBC/hpf   Bacteria, UA NONE SEEN NONE SEEN   Squamous Epithelial / LPF NONE SEEN 0 - 5   Mucus PRESENT     Comment: Performed at [facility], [address] [place], [place] [address]  Blood gas, venous     Status: Abnormal   Collection Time: 08/21/19  5:08 PM  Result Value Ref Range   pH, Ven 7.38 7.250 - 7.430   pCO2, Ven 51  44.0 - 60.0 mmHg   Bicarbonate 30.2 (H) 20.0 - 28.0 mmol/L   Acid-Base Excess 3.9 (H) 0.0 - 2.0 mmol/L   O2 Saturation 51.2 %   Patient temperature 37.0    Collection site VEIN    Sample type VENIPUNCTURE     Comment: Performed at [facility], [address], [place], [place] [address]   Dg Chest 2 View  Result Date: 08/21/2019 CLINICAL DATA:  Weakness EXAM: CHEST - 2 VIEW COMPARISON:  10/08/2017 FINDINGS: COPD with hyperinflation and scarring. No acute infiltrate or effusion. Negative for heart failure. Atherosclerotic calcification aortic arch. IMPRESSION: COPD without acute cardiopulmonary abnormality. Electronically Signed   By: Franchot Gallo M.D.   On: 08/21/2019 17:50   Ct Head Wo Contrast  Result Date: 08/21/2019 CLINICAL DATA:  Altered level of consciousness (LOC), unexplained. EXAM: CT HEAD WITHOUT CONTRAST TECHNIQUE: Contiguous axial images were obtained from the base of the skull through the vertex without intravenous contrast. COMPARISON:  Head CT 08/15/2011 FINDINGS: Brain: No evidence of acute intracranial hemorrhage. No demarcated cortical infarction. No evidence of intracranial mass. No midline shift or extra-axial fluid collection. Stable generalized parenchymal atrophy and chronic small vessel ischemic disease. Redemonstrated prominent perivascular space versus small chronic lacunar infarct within the inferior left basal ganglia Vascular: No hyperdense vessel.  Atherosclerotic calcifications. Skull: Normal. Negative for fracture or focal lesion. Sinuses/Orbits: Visualized orbits demonstrate no acute abnormality. No significant paranasal sinus disease or mastoid effusion at the imaged levels. IMPRESSION: 1. No evidence of acute intracranial abnormality. 2. Stable generalized parenchymal atrophy and chronic small vessel ischemic disease. Electronically Signed   By: Kellie Simmering DO   On: 08/21/2019 17:53   US Aorta Duplex Limited  Result Date: 08/21/2019 CLINICAL  DATA:  Pulsatile abdominal mass. EXAM: US ABDOMINAL AORTA MEDICARE SCREENING TECHNIQUE: Ultrasound examination of the abdominal aorta was performed as a screening evaluation for abdominal aortic aneurysm. COMPARISON:  CT abdomen pelvis, 06/26/2017. FINDINGS: Abdominal aortic measurements as follows: (AP x transverse ) Proximal:  2.9 x 2.7 cm Mid:  2.7 x 2.7 cm Distal:  6.2 x 6.1 cm IMPRESSION: 1. 6.2 cm infrarenal abdominal aortic aneurysm, increased from 5 cm from the prior abdomen and pelvis CT. No sonographic evidence of rupture. Mural thrombus noted along the anterior aspect of the aneurysm. Vascular surgery  consultation recommended due to increased risk of rupture for AAA >5.5 cm. This recommendation follows ACR consensus guidelines: White Paper of the ACR Incidental Findings Committee II on Vascular Findings. J Am Coll Radiol 2013; 10:789-794. Aortic aneurysm NOS (ICD10-I71.9) Electronically Signed   By: Lajean Manes M.D.   On: 08/21/2019 17:57    Pending Labs Unresulted Labs (From admission, onward)    Start     Ordered   08/28/19 0500  Creatinine, serum  (enoxaparin (LOVENOX)    CrCl >/= 30 ml/min)  Weekly,   STAT    Comments: while on enoxaparin therapy    08/21/19 1954   08/22/19 0500  Hemoglobin A1c  Tomorrow morning,   STAT     08/21/19 1954   08/22/19 0500  PSA  Tomorrow morning,   STAT     08/21/19 1954   08/21/19 1955  CBC  (enoxaparin (LOVENOX)    CrCl >/= 30 ml/min)  Once,   STAT    Comments: Baseline for enoxaparin therapy IF NOT ALREADY DRAWN.  Notify MD if PLT < 100 K.    08/21/19 1954   08/21/19 1955  Creatinine, serum  (enoxaparin (LOVENOX)    CrCl >/= 30 ml/min)  Once,   STAT    Comments: Baseline for enoxaparin therapy IF NOT ALREADY DRAWN.    08/21/19 1954   08/21/19 1827  SARS CORONAVIRUS 2 (TAT 6-24 HRS) Nasopharyngeal Nasopharyngeal Swab  (Asymptomatic/Tier 3)  Once,   STAT    Question Answer Comment  Is this test for diagnosis or screening Screening    Symptomatic for COVID-19 as defined by CDC No   Hospitalized for COVID-19 No   Admitted to ICU for COVID-19 No   Previously tested for COVID-19 No   Resident in a congregate (group) care setting No   Employed in healthcare setting No      08/21/19 1827   08/21/19 1724  Urine culture  ONCE - STAT,   STAT    Question:  Patient immune status  Answer:  Normal   08/21/19 1724          Vitals/Pain Today's Vitals   08/21/19 2300 08/21/19 2330 08/22/19 0000 08/22/19 0030  BP: 115/86 131/71 129/84 117/68  Pulse: (!) 54 (!) 53 (!) 49 (!) 45  Resp: 16 16 15 18   Temp:      TempSrc:      SpO2: 96% 96% 94% 96%  Weight:      Height:        Isolation Precautions No active isolations  Medications Medications  donepezil (ARICEPT) tablet 5 mg (5 mg Oral Given 08/21/19 2150)  QUEtiapine (SEROQUEL) tablet 25 mg (25 mg Oral Given 08/21/19 2149)  sertraline (ZOLOFT) tablet 50 mg (has no administration in time range)  senna-docusate (Senokot-S) tablet 2 tablet (2 tablets Oral Given 08/21/19 2052)  Melatonin TABS 5 mg (5 mg Oral Given 08/21/19 2150)  enoxaparin (LOVENOX) injection 40 mg (40 mg Subcutaneous Given 08/21/19 2103)  dextrose 5 %-0.45 % sodium chloride infusion ( Intravenous New Bag/Given 08/21/19 2042)  sodium chloride 0.9 % bolus 1,000 mL (0 mLs Intravenous Stopped 08/21/19 1846)  cefTRIAXone (ROCEPHIN) 2 g in sodium chloride 0.9 % 100 mL IVPB (0 g Intravenous Stopped 08/21/19 1842)  0.9 %  sodium chloride infusion ( Intravenous Stopped 08/22/19 0049)    Mobility non-ambulatory High fall risk   Focused Assessments Cardiac Assessment Handoff:  Cardiac Rhythm: Atrial fibrillation Lab Results  Component Value Date   TROPONINI <0.03 10/08/2017  No results found for: DDIMER Does the Patient currently have chest pain? No     R Recommendations: See Admitting Provider Note  Report given to:   Additional Notes:

## 2019-08-22 NOTE — Progress Notes (Signed)
Patient transferred to low bed with mats on floor due to history of falls and altered mental status.

## 2019-08-22 NOTE — Progress Notes (Signed)
Received call from CCMD about patients HR in the low 40's. Upon assessment patient comfortable in bed, asymptomatic. This RN notified MD. No new orders at this time. Will continue to monitor.

## 2019-08-22 NOTE — ED Notes (Signed)
Pt dry, awake.

## 2019-08-23 DIAGNOSIS — I10 Essential (primary) hypertension: Secondary | ICD-10-CM

## 2019-08-23 LAB — CBC WITH DIFFERENTIAL/PLATELET
Abs Immature Granulocytes: 0.04 10*3/uL (ref 0.00–0.07)
Basophils Absolute: 0 10*3/uL (ref 0.0–0.1)
Basophils Relative: 1 %
Eosinophils Absolute: 0.1 10*3/uL (ref 0.0–0.5)
Eosinophils Relative: 2 %
HCT: 36.9 % — ABNORMAL LOW (ref 39.0–52.0)
Hemoglobin: 13.1 g/dL (ref 13.0–17.0)
Immature Granulocytes: 1 %
Lymphocytes Relative: 12 %
Lymphs Abs: 0.9 10*3/uL (ref 0.7–4.0)
MCH: 32.2 pg (ref 26.0–34.0)
MCHC: 35.5 g/dL (ref 30.0–36.0)
MCV: 90.7 fL (ref 80.0–100.0)
Monocytes Absolute: 0.8 10*3/uL (ref 0.1–1.0)
Monocytes Relative: 11 %
Neutro Abs: 5.5 10*3/uL (ref 1.7–7.7)
Neutrophils Relative %: 73 %
Platelets: 168 10*3/uL (ref 150–400)
RBC: 4.07 MIL/uL — ABNORMAL LOW (ref 4.22–5.81)
RDW: 11.9 % (ref 11.5–15.5)
WBC: 7.3 10*3/uL (ref 4.0–10.5)
nRBC: 0 % (ref 0.0–0.2)

## 2019-08-23 LAB — URINE CULTURE
Culture: NO GROWTH
Special Requests: NORMAL

## 2019-08-23 LAB — MRSA PCR SCREENING: MRSA by PCR: NEGATIVE

## 2019-08-23 MED ORDER — SODIUM CHLORIDE 0.9% FLUSH
3.0000 mL | Freq: Two times a day (BID) | INTRAVENOUS | Status: DC
  Administered 2019-08-23 – 2019-08-27 (×8): 3 mL via INTRAVENOUS

## 2019-08-23 NOTE — Progress Notes (Signed)
Physical Therapy Evaluation Patient Details Name: Carlos Hayden. MRN: [id number] DOB: 08/15/1926 Today's Date: 08/23/2019   History of Present Illness  Carlos Hayden. is a 85 y.o. male with medical history significant for dementia, hypertension, prostate cancer in remission, htn, AAA, and bradycardia, presents with confusion.  Clinical Impression  Patient has confusion and is not able to answer any questions. He is able to follow some commands for bed mobility and transfer assessment. He needs max assist for bed mobility sit <> supine, and max assist for sit <> stand transfer. He needs mod assist to sit at edge of bed with BUE support . He will benefit from skilled PT to improve strength and mobility and sitting balance for seated activities.     Follow Up Recommendations SNF    Equipment Recommendations  None recommended by PT    Recommendations for Other Services       Precautions / Restrictions Restrictions Weight Bearing Restrictions: No      Mobility  Bed Mobility Overal bed mobility: Needs Assistance Bed Mobility: Supine to Sit;Sit to Supine     Supine to sit: Max assist Sit to supine: Max assist   General bed mobility comments: Needs had over hand and vC  Transfers Overall transfer level: Needs assistance Equipment used: 1 person hand held assist Transfers: Sit to/from Stand Sit to Stand: Max assist         General transfer comment: posterior leaning  Ambulation/Gait Ambulation/Gait assistance: (unable)              Stairs            Wheelchair Mobility    Modified Rankin (Stroke Patients Only)       Balance Overall balance assessment: Needs assistance Sitting-balance support: Bilateral upper extremity supported Sitting balance-Leahy Scale: Poor   Postural control: Posterior lean;Left lateral lean Standing balance support: Bilateral upper extremity supported Standing balance-Leahy Scale: Poor                                Pertinent Vitals/Pain Pain Assessment: No/denies pain    Home Living Family/patient expects to be discharged to:: Skilled nursing facility                      Prior Function Level of Independence: (unknown)               Hand Dominance        Extremity/Trunk Assessment        Lower Extremity Assessment Lower Extremity Assessment: Generalized weakness(unable to formally test due to confusion)       Communication   Communication: Other (comment)(able to follow 50% of cues)  Cognition Arousal/Alertness: Awake/alert Behavior During Therapy: (not orientated to place or time) Overall Cognitive Status: History of cognitive impairments - at baseline                                 General Comments: (cooperative 50% and confused 100%)      General Comments      Exercises     Assessment/Plan    PT Assessment Patient needs continued PT services  PT Problem List Decreased strength;Decreased activity tolerance;Decreased balance;Decreased mobility       PT Treatment Interventions Gait training;Functional mobility training;Therapeutic exercise;Balance training    PT Goals (Current goals can be found in the Care  Plan section)  Acute Rehab PT Goals PT Goal Formulation: Patient unable to participate in goal setting Time For Goal Achievement: 09/06/19 Potential to Achieve Goals: Fair    Frequency Min 2X/week   Barriers to discharge        Co-evaluation               AM-PAC PT "6 Clicks" Mobility  Outcome Measure Help needed turning from your back to your side while in a flat bed without using bedrails?: Total Help needed moving from lying on your back to sitting on the side of a flat bed without using bedrails?: Total Help needed moving to and from a bed to a chair (including a wheelchair)?: Total Help needed standing up from a chair using your arms (e.g., wheelchair or bedside chair)?: Total Help  needed to walk in hospital room?: Total Help needed climbing 3-5 steps with a railing? : Total 6 Click Score: 6    End of Session Equipment Utilized During Treatment: Gait belt Activity Tolerance: Patient limited by fatigue;Patient limited by lethargy Patient left: in bed;with bed alarm set Nurse Communication: Mobility status PT Visit Diagnosis: Muscle weakness (generalized) (M62.81);Difficulty in walking, not elsewhere classified (R26.2)    Time: 1005-1020 PT Time Calculation (min) (ACUTE ONLY): 15 min   Charges:   PT Evaluation $PT Eval Low Complexity: 1 Low           Alanson Puls, PT DPT 08/23/2019, 11:53 AM

## 2019-08-23 NOTE — Progress Notes (Signed)
PROGRESS NOTE    [place].  [id number] DOB: [birth year] DOA: 08/21/2019  PCP: Einar Pheasant, MD    LOS - 1   Brief Narrative:  85 y.o.malewith medical history significant foradvanced dementia, hypertension, prostate cancer in remission, htn, AAA, and bradycardia, presents after being found at SNF unresponsive in a chair.  Two prior ED visits for falls this month.   Per report, patient has had no fevers or respiratory symptoms points of dysuria, no vomiting or diarrhea.  No recent med changes.  In the ED, patient afebrile bradycardic in 50s and mildly hypertensive.  Labs are unremarkable.  UA showed moderate LE, 11-20 WBCs, bacteria.  Treated with Rocephin and urine culture pending.  CT of head negative.  Chest x-ray negative.  Pulsatile abdominal mass noted on exam, therefore ultrasound was obtained and showed 6.2 cm abdominal aortic aneurysm up from 5 cm previously.  Vascular surgery was consulted and recommended no intervention given patient's advanced age with dementia and high risk for morbidity mortality with any intervention.  Patient was admitted for observation telemetry and IV hydration.  Holding further antibiotics pending urine culture.  Subjective 11/22: Patient awake laying in bed, pulling at mittens.  PT in room to begin eval.  Patient does not answer questions regarding any symptoms, but appears comfortable and in no distress or discomfort.  No acute events reported overnight.  Assessment & Plan:   Principal Problem:   Acute metabolic encephalopathy Active Problems:   Essential hypertension   History of prostate cancer   Aneurysm of infrarenal abdominal aorta (HCC)   AMS (altered mental status)   Acute encephalopathy - unclear etiology.  Suspect this is secondary to advanced dementia with waxing and waning periods of delirium.  Patient afebrile and without leukocytosis, doubt this is infectious.  Covid negative.  No acute findings on head CT  obtained due to recurrent falls recently.  Urine culture negative, rules out UTI.  -D/C Telemetry -Delirium precautions -PT OT -Stop fluids and monitor  Dementia, advanced -Continue home Aricept, Seroquel, Zoloft, melatonin -Hold as needed Ativan -Follow-up med list from SNF to confirm current meds  Recurrent falls -likely secondary to overall deconditioning of end-stage dementia -PT OT -Fall precautions  Abdominal aortic aneurysm Previously 5 cm, now 6.2 per ultrasound obtained on admission after abdominal mass was palpated on exam.  Vascular surgery was consulted and recommended no intervention due to patient's advanced age and advanced dementia, has high risk of significant morbidity mortality.  Hypertension -chronic -We will treat with as needed medications for now pending current med list from SNF  Stage I sacral decubitus ulcer -present on admission.  Wound care.  Superficial lacerations of the scalp, lower extremities.  Due to recent falls.  Not bleeding and no signs of infection.  Wound care.   DVT prophylaxis: Lovenox   Code Status: DNR  Family Communication: None at bedside Disposition Plan: d/c to SNF likely tomorrow if medically stable   Consultants:   Vascular surgery  Procedures:   Aortic Doppler ultrasound 11/20  Antimicrobials:   None  1 g Rocephin given in ED   Objective: Vitals:   08/22/19 1634 08/22/19 2023 08/23/19 0455 08/23/19 0457  BP: 122/80 (!) 119/99 (!) 137/121 (!) 120/46  Pulse: (!) 50 (!) 109 (!) 51 (!) 48  Resp:  20 19   Temp:  98.4 F (36.9 C) 97.8 F (36.6 C)   TempSrc:  Oral    SpO2:  98% 94%   Weight:  Height:        Intake/Output Summary (Last 24 hours) at 08/23/2019 0751 Last data filed at 08/23/2019 [id number] Gross per 24 hour  Intake 2178.85 ml  Output 375 ml  Net 1803.85 ml   Filed Weights   08/21/19 1548 08/22/19 0335  Weight: 68.7 kg 72.1 kg    Examination:  General exam: awake, alert, no  acute distress Respiratory system: clear to auscultation bilaterally, no wheezes, rales or rhonchi, normal respiratory effort. Cardiovascular system: normal S1/S2, RRR, no JVD, murmurs, rubs, gallops, no pedal edema.   Gastrointestinal system: soft, non-tender, non-distended abdomen. Central nervous system: alert, oriented to self, no gross focal neurologic deficits Extremities: moves all, no edema, normal tone Skin: superficial lacerations on shins and scalp without signs of infection, skin dry, normal temperature  Data Reviewed: I have personally reviewed following labs and imaging studies  CBC: Recent Labs  Lab 08/21/19 1612 08/23/19 0531  WBC 10.0 7.3  NEUTROABS  --  5.5  HGB 13.5 13.1  HCT 37.8* 36.9*  MCV 90.2 90.7  PLT 153 [id number]   Basic Metabolic Panel: Recent Labs  Lab 08/21/19 1612  NA 135  K 4.1  CL 102  CO2 23  GLUCOSE 126*  BUN 26*  CREATININE 0.62  CALCIUM 8.5*   GFR: Estimated Creatinine Clearance: 60.1 mL/min (by C-G formula based on SCr of 0.62 mg/dL). Liver Function Tests: No results for input(s): AST, ALT, ALKPHOS, BILITOT, PROT, ALBUMIN in the last 168 hours. No results for input(s): LIPASE, AMYLASE in the last 168 hours. No results for input(s): AMMONIA in the last 168 hours. Coagulation Profile: No results for input(s): INR, PROTIME in the last 168 hours. Cardiac Enzymes: No results for input(s): CKTOTAL, CKMB, CKMBINDEX, TROPONINI in the last 168 hours. BNP (last 3 results) No results for input(s): PROBNP in the last 8760 hours. HbA1C: Recent Labs    08/22/19 0528  HGBA1C 5.3   CBG: No results for input(s): GLUCAP in the last 168 hours. Lipid Profile: No results for input(s): CHOL, HDL, LDLCALC, TRIG, CHOLHDL, LDLDIRECT in the last 72 hours. Thyroid Function Tests: No results for input(s): TSH, T4TOTAL, FREET4, T3FREE, THYROIDAB in the last 72 hours. Anemia Panel: No results for input(s): VITAMINB12, FOLATE, FERRITIN, TIBC, IRON,  RETICCTPCT in the last 72 hours. Sepsis Labs: No results for input(s): PROCALCITON, LATICACIDVEN in the last 168 hours.  Recent Results (from the past 240 hour(s))  SARS CORONAVIRUS 2 (TAT 6-24 HRS) Nasopharyngeal Nasopharyngeal Swab     Status: None   Collection Time: 08/21/19  6:45 PM   Specimen: Nasopharyngeal Swab  Result Value Ref Range Status   SARS Coronavirus 2 NEGATIVE NEGATIVE Final    Comment: (NOTE) SARS-CoV-2 target nucleic acids are NOT DETECTED. The SARS-CoV-2 RNA is generally detectable in upper and lower respiratory specimens during the acute phase of infection. Negative results do not preclude SARS-CoV-2 infection, do not rule out co-infections with other pathogens, and should not be used as the sole basis for treatment or other patient management decisions. Negative results must be combined with clinical observations, patient history, and epidemiological information. The expected result is Negative. Fact Sheet for Patients: SugarRoll.be Fact Sheet for Healthcare Providers: https://www.woods-mathews.com/ This test is not yet approved or cleared by the [place] FDA and  has been authorized for detection and/or diagnosis of SARS-CoV-2 by FDA under an Emergency Use Authorization (EUA). This EUA will remain  in effect (meaning this test can be used) for the duration of the COVID-19 declaration under  Section 56 4(b)(1) of the Act, 21 U.S.C. section 360bbb-3(b)(1), unless the authorization is terminated or revoked sooner. Performed at [facility], [place] [address] [place], [place] [address]   MRSA PCR Screening     Status: None   Collection Time: 08/22/19 10:55 PM   Specimen: Nasopharyngeal  Result Value Ref Range Status   MRSA by PCR NEGATIVE NEGATIVE Final    Comment:        The GeneXpert MRSA Assay (FDA approved for NASAL specimens only), is one component of a comprehensive MRSA colonization surveillance  program. It is not intended to diagnose MRSA infection nor to guide or monitor treatment for MRSA infections. Performed at [facility], [address], [place], [place] [address]          Radiology Studies: Dg Chest 2 View  Result Date: 08/21/2019 CLINICAL DATA:  Weakness EXAM: CHEST - 2 VIEW COMPARISON:  10/08/2017 FINDINGS: COPD with hyperinflation and scarring. No acute infiltrate or effusion. Negative for heart failure. Atherosclerotic calcification aortic arch. IMPRESSION: COPD without acute cardiopulmonary abnormality. Electronically Signed   By: Franchot Gallo M.D.   On: 08/21/2019 17:50   Ct Head Wo Contrast  Result Date: 08/21/2019 CLINICAL DATA:  Altered level of consciousness (LOC), unexplained. EXAM: CT HEAD WITHOUT CONTRAST TECHNIQUE: Contiguous axial images were obtained from the base of the skull through the vertex without intravenous contrast. COMPARISON:  Head CT 08/15/2011 FINDINGS: Brain: No evidence of acute intracranial hemorrhage. No demarcated cortical infarction. No evidence of intracranial mass. No midline shift or extra-axial fluid collection. Stable generalized parenchymal atrophy and chronic small vessel ischemic disease. Redemonstrated prominent perivascular space versus small chronic lacunar infarct within the inferior left basal ganglia Vascular: No hyperdense vessel.  Atherosclerotic calcifications. Skull: Normal. Negative for fracture or focal lesion. Sinuses/Orbits: Visualized orbits demonstrate no acute abnormality. No significant paranasal sinus disease or mastoid effusion at the imaged levels. IMPRESSION: 1. No evidence of acute intracranial abnormality. 2. Stable generalized parenchymal atrophy and chronic small vessel ischemic disease. Electronically Signed   By: Kellie Simmering DO   On: 08/21/2019 17:53   US Aorta Duplex Limited  Result Date: 08/21/2019 CLINICAL DATA:  Pulsatile abdominal mass. EXAM: US ABDOMINAL AORTA MEDICARE SCREENING  TECHNIQUE: Ultrasound examination of the abdominal aorta was performed as a screening evaluation for abdominal aortic aneurysm. COMPARISON:  CT abdomen pelvis, 06/26/2017. FINDINGS: Abdominal aortic measurements as follows: (AP x transverse ) Proximal:  2.9 x 2.7 cm Mid:  2.7 x 2.7 cm Distal:  6.2 x 6.1 cm IMPRESSION: 1. 6.2 cm infrarenal abdominal aortic aneurysm, increased from 5 cm from the prior abdomen and pelvis CT. No sonographic evidence of rupture. Mural thrombus noted along the anterior aspect of the aneurysm. Vascular surgery consultation recommended due to increased risk of rupture for AAA >5.5 cm. This recommendation follows ACR consensus guidelines: White Paper of the ACR Incidental Findings Committee II on Vascular Findings. J Am Coll Radiol 2013; 10:789-794. Aortic aneurysm NOS (ICD10-I71.9) Electronically Signed   By: Lajean Manes M.D.   On: 08/21/2019 17:57        Scheduled Meds:  donepezil  5 mg Oral QHS   enoxaparin (LOVENOX) injection  40 mg Subcutaneous Q24H   Melatonin  5 mg Oral QHS   QUEtiapine  25 mg Oral BID   senna-docusate  2 tablet Oral Daily   sertraline  50 mg Oral Daily   Continuous Infusions:  dextrose 5 % and 0.45% NaCl 75 mL/hr at 08/22/19 2250  LOS: 1 day    Time spent: 25-30 minutes    Ezekiel Slocumb, DO [facility] Pager: [phone]  If 7PM-7AM, please contact night-coverage [email or web address] Password [facility] 08/23/2019, 7:51 AM

## 2019-08-24 ENCOUNTER — Telehealth: Payer: Self-pay | Admitting: Internal Medicine

## 2019-08-24 NOTE — Telephone Encounter (Signed)
-----   Message from Einar Pheasant, MD sent at 08/22/2019 11:26 AM EST ----- Regarding: removal This pt is in an assisted living facility and he is now followed by [employer] call.  Can you remove me as his pcp.  Thanks    Dr Nicki Reaper

## 2019-08-24 NOTE — Clinical Social Work Note (Signed)
CSW left voicemail for patient's daughter/legal guardian. Will discuss SNF placement when she calls back.  Dayton Scrape, [place]

## 2019-08-24 NOTE — Progress Notes (Signed)
I called his daughter and it went straight to voicemail.  She did identify herself by name and her voice message and therefore I did leave information.  The voice message stated that given his age and comorbidities he would not benefit from aneurysm repair and I would not recommend surgical or endovascular treatment.  I have reviewed his CT scan of the abdomen and pelvis from 2 years ago at that time he was a borderline candidate for endovascular repair.  I think it is highly likely that he has had significant growth not only of the sac of his aneurysm but also the infrarenal neck area and that he would not be a candidate for standard endograft.  He is certainly not a candidate for open surgery.  Given these additional findings I would reiterate that I do not recommend treatment of his aneurysm.  I will be happy to discuss this further with his daughter if she calls back I did leave both the hospital number as well as my office number.

## 2019-08-24 NOTE — Telephone Encounter (Signed)
Dr.Scott has been removed at PCP.

## 2019-08-24 NOTE — Progress Notes (Signed)
PROGRESS NOTE    [place].  [id number] DOB: Feb 26, 1926 DOA: 08/21/2019  PCP: Einar Pheasant, MD    LOS - 2   Brief Narrative:  86 y.o.malewith medical history significant foradvanceddementia, hypertension, prostate cancer in remission, htn, AAA, and bradycardia, presentsafter being found at SNF unresponsive in a chair.Two prior ED visits for falls this month.Per report, patient has had no fevers or respiratory symptoms points of dysuria, no vomiting or diarrhea. No recent med changes. In the ED, patient afebrile bradycardic in 50s and mildly hypertensive. Labs are unremarkable. UA showed moderate LE, 11-20 WBCs, bacteria. Treated with Rocephin and urine culture pending.CT of head negative. Chest x-ray negative. Pulsatile abdominal mass noted on exam,therefore ultrasound was obtained and showed 6.2 cm abdominal aortic aneurysm up from 5 cm previously. Vascular surgery was consulted and recommended no intervention given patient's advanced age with dementia and high risk for morbidity mortality with any intervention. Patient was admitted for observation telemetry and IV hydration.Holding further antibiotics pending urine culture.  Subjective 11/23: Patient awake and lying in bed when seen and examined this morning.  No acute events reported overnight.  He does not answer when asked about any symptoms at this time.  He is resistant to physical exam.  Assessment & Plan:   Principal Problem:   Acute metabolic encephalopathy Active Problems:   Essential hypertension   History of prostate cancer   Aneurysm of infrarenal abdominal aorta (HCC)   AMS (altered mental status)   Acute encephalopathy-unclear etiology. Suspect this is secondary to advanced dementia with waxing and waning periods of delirium. Patient afebrile and without leukocytosis, doubt this is infectious. Covid negative. No acute findings on head CT obtained due to recurrent falls  recently.  Urine culture negative, rules out UTI.  -D/C Telemetry -Delirium precautions -PT OT -Stop fluids and monitor  Dementia, advanced -Continue home Aricept, Seroquel, Zoloft, melatonin -Hold as needed Ativan -Follow-up med list from SNF to confirm current meds  Recurrent falls-likely secondary to overall deconditioning of end-stage dementia -PT OT -Fall precautions  Abdominal aortic aneurysm Previously 5 cm, now 6.2 per ultrasound obtained on admission after abdominal mass was palpated on exam. Vascular surgery was consulted and recommended no intervention due to patient's advanced age and advanced dementia, has high risk of significant morbidity mortality.  Hypertension-chronic -We will treat with as needed medications for now pending current med list from SNF  Stage I sacral decubitus ulcer-present on admission.Wound care.  Superficial lacerationsof the scalp, lower extremities.Due to recent falls. Not bleeding and no signs of infection. Wound care.   DVT prophylaxis:Lovenox Code Status: DNR Family Communication:None at bedside Disposition Plan:d/cto SNF when bed available   Consultants:  Vascular surgery  Procedures:  Aortic Doppler ultrasound 11/20  Antimicrobials:  None  1 g Rocephin given in ED   Objective: Vitals:   08/24/19 0400 08/24/19 0402 08/24/19 0541 08/24/19 0741  BP: (!) 124/110 133/62  98/82  Pulse: 72 75  67  Resp: 19     Temp: 97.9 F (36.6 C)     TempSrc: Oral     SpO2: 96%     Weight:   72.1 kg   Height:        Intake/Output Summary (Last 24 hours) at 08/24/2019 1051 Last data filed at 08/24/2019 0957 Gross per 24 hour  Intake 0 ml  Output 50 ml  Net -50 ml   Filed Weights   08/21/19 1548 08/22/19 0335 08/24/19 0541  Weight: 68.7 kg 72.1 kg 72.1  kg    Examination:  General exam: awake, alert, no acute distress Respiratory system: normal respiratory effort, clear upper lungs  anteriorly, exam limited by patient resistance Cardiovascular system: normal S1/S2, RRR, no pedal edema.   Central nervous system: No gross focal neurologic deficits, normal speech Extremities: moves all, no edema   Data Reviewed: I have personally reviewed following labs and imaging studies  CBC: Recent Labs  Lab 08/21/19 1612 08/23/19 0531  WBC 10.0 7.3  NEUTROABS  --  5.5  HGB 13.5 13.1  HCT 37.8* 36.9*  MCV 90.2 90.7  PLT 153 [id number]   Basic Metabolic Panel: Recent Labs  Lab 08/21/19 1612  NA 135  K 4.1  CL 102  CO2 23  GLUCOSE 126*  BUN 26*  CREATININE 0.62  CALCIUM 8.5*   GFR: Estimated Creatinine Clearance: 60.1 mL/min (by C-G formula based on SCr of 0.62 mg/dL). Liver Function Tests: No results for input(s): AST, ALT, ALKPHOS, BILITOT, PROT, ALBUMIN in the last 168 hours. No results for input(s): LIPASE, AMYLASE in the last 168 hours. No results for input(s): AMMONIA in the last 168 hours. Coagulation Profile: No results for input(s): INR, PROTIME in the last 168 hours. Cardiac Enzymes: No results for input(s): CKTOTAL, CKMB, CKMBINDEX, TROPONINI in the last 168 hours. BNP (last 3 results) No results for input(s): PROBNP in the last 8760 hours. HbA1C: Recent Labs    08/22/19 0528  HGBA1C 5.3   CBG: No results for input(s): GLUCAP in the last 168 hours. Lipid Profile: No results for input(s): CHOL, HDL, LDLCALC, TRIG, CHOLHDL, LDLDIRECT in the last 72 hours. Thyroid Function Tests: No results for input(s): TSH, T4TOTAL, FREET4, T3FREE, THYROIDAB in the last 72 hours. Anemia Panel: No results for input(s): VITAMINB12, FOLATE, FERRITIN, TIBC, IRON, RETICCTPCT in the last 72 hours. Sepsis Labs: No results for input(s): PROCALCITON, LATICACIDVEN in the last 168 hours.  Recent Results (from the past 240 hour(s))  Urine culture     Status: None   Collection Time: 08/21/19  5:07 PM   Specimen: Urine, Clean Catch  Result Value Ref Range Status   Specimen  Description   Final    URINE, CLEAN CATCH Performed at [facility], [address] [place], [place] [address]    Special Requests   Final    Normal Performed at [facility], [address], [place], [place] [address]    Culture   Final    NO GROWTH Performed at [facility], [place] [address] [place], [place] [address]    Report Status 08/23/2019 FINAL  Final  SARS CORONAVIRUS 2 (TAT 6-24 HRS) Nasopharyngeal Nasopharyngeal Swab     Status: None   Collection Time: 08/21/19  6:45 PM   Specimen: Nasopharyngeal Swab  Result Value Ref Range Status   SARS Coronavirus 2 NEGATIVE NEGATIVE Final    Comment: (NOTE) SARS-CoV-2 target nucleic acids are NOT DETECTED. The SARS-CoV-2 RNA is generally detectable in upper and lower respiratory specimens during the acute phase of infection. Negative results do not preclude SARS-CoV-2 infection, do not rule out co-infections with other pathogens, and should not be used as the sole basis for treatment or other patient management decisions. Negative results must be combined with clinical observations, patient history, and epidemiological information. The expected result is Negative. Fact Sheet for Patients: SugarRoll.be Fact Sheet for Healthcare Providers: https://www.woods-mathews.com/ This test is not yet approved or cleared by the [place] FDA and  has been authorized for detection and/or diagnosis of  SARS-CoV-2 by FDA under an Emergency Use Authorization (EUA). This EUA will remain  in effect (meaning this test can be used) for the duration of the COVID-19 declaration under Section 56 4(b)(1) of the Act, 21 U.S.C. section 360bbb-3(b)(1), unless the authorization is terminated or revoked sooner. Performed at [facility], [place] [address], [place], [place] [address]   MRSA PCR Screening     Status: None   Collection Time: 08/22/19 10:55 PM   Specimen:  Nasopharyngeal  Result Value Ref Range Status   MRSA by PCR NEGATIVE NEGATIVE Final    Comment:        The GeneXpert MRSA Assay (FDA approved for NASAL specimens only), is one component of a comprehensive MRSA colonization surveillance program. It is not intended to diagnose MRSA infection nor to guide or monitor treatment for MRSA infections. Performed at [facility], [address] [place], [place] [address]          Radiology Studies: No results found.      Scheduled Meds: . donepezil  5 mg Oral QHS  . enoxaparin (LOVENOX) injection  40 mg Subcutaneous Q24H  . Melatonin  5 mg Oral QHS  . QUEtiapine  25 mg Oral BID  . senna-docusate  2 tablet Oral Daily  . sertraline  50 mg Oral Daily  . sodium chloride flush  3 mL Intravenous Q12H   Continuous Infusions:   LOS: 2 days    Time spent: 20-25 minutes    Ezekiel Slocumb, DO [facility] Pager: [phone]  If 7PM-7AM, please contact night-coverage [email or web address] Password [facility] 08/24/2019, 10:51 AM

## 2019-08-24 NOTE — Plan of Care (Signed)
  Problem: Clinical Measurements: Goal: Ability to maintain clinical measurements within normal limits will improve Outcome: Progressing   Problem: Clinical Measurements: Goal: Will remain free from infection Outcome: Progressing   Problem: Clinical Measurements: Goal: Diagnostic test results will improve Outcome: Progressing   

## 2019-08-25 DIAGNOSIS — R410 Disorientation, unspecified: Secondary | ICD-10-CM

## 2019-08-25 NOTE — Progress Notes (Signed)
PT Cancellation Note  Patient Details Name: Carlos Hayden. MRN: [id number] DOB: 27-Nov-1925   Cancelled Treatment:    Reason Eval/Treat Not Completed: Other (comment)(PT attempted to see pt, repositioned turned lights on, pt exhibited frustration and did not want PT to remove blankets or reposition. RN at bedside to attempt to administer medications. PT to follow up as able.)  Lieutenant Diego PT, DPT 9:07 AM,08/25/19 [phone]

## 2019-08-25 NOTE — Progress Notes (Signed)
PROGRESS NOTE    [place].  [id number] DOB: [birth year] DOA: 08/21/2019  PCP: No primary care provider on file.    LOS - 3   Brief Narrative:  85 y.o.malewith medical history significant foradvanceddementia, hypertension, prostate cancer in remission, htn, AAA, and bradycardia, presentsafter being found at SNF unresponsive in a chair.Two prior ED visits for falls this month.Per report, patient has had no fevers or respiratory symptoms points of dysuria, no vomiting or diarrhea. No recent med changes. In the ED, patient afebrile bradycardic in 50s and mildly hypertensive. Labs are unremarkable. UA showed moderate LE, 11-20 WBCs, bacteria. Treated with Rocephin and urine culture pending.CT of head negative. Chest x-ray negative. Pulsatile abdominal mass noted on exam,therefore ultrasound was obtained and showed 6.2 cm abdominal aortic aneurysm up from 5 cm previously. Vascular surgery was consulted and recommended no intervention given patient's advanced age with dementia and high risk for morbidity mortality with any intervention. Patient was admitted initially for observation, telemetry and IV hydration.Holding further antibiotics pending urine culture.  Awaiting placement but patient's daughter and guardian has not been returning SW calls to assist with disposition.  Palliative consult as patient not eating or drinking this admission, goals of care discussion warranted.   Subjective 11/24: Patient awake, laying in bed.  No acute events reported overnight.  Patient appears in no distress.  He denies pain or discomfort, fevers or chills.  He is again resistant to physical exam.   Assessment & Plan:   Principal Problem:   Acute metabolic encephalopathy Active Problems:   Essential hypertension   History of prostate cancer   Aneurysm of infrarenal abdominal aorta (HCC)   AMS (altered mental status)   Acute encephalopathy-unclear etiology.  Suspect this is secondary to advanced dementia with waxing and waning periods of delirium. Patient afebrile and without leukocytosis, doubt this is infectious. Covid negative. No acute findings on head CT obtained due to recurrent falls recently.Urine culture negative, rules out UTI. -D/CTelemetry -Delirium precautions -PT OT -Stop fluids and monitor  Dementia, advanced -Continue home Aricept, Seroquel, Zoloft, melatonin -Hold as needed Ativan -Follow-up med list from SNF to confirm current meds --Palliative care consulted   Recurrent falls-likely secondary to overall deconditioning of end-stage dementia -PT OT -Fall precautions  Abdominal aortic aneurysm Previously 5 cm, now 6.2 per ultrasound obtained on admission after abdominal mass was palpated on exam. Vascular surgery was consulted and recommended no intervention due to patient's advanced age and advanced dementia, has high risk of significant morbidity mortality.  Hypertension-chronic -We will treat with as needed medications for now pending current med list from SNF  Stage I sacral decubitus ulcer-present on admission.Wound care.  Superficial lacerationsof the scalp, lower extremities.Due to recent falls. Not bleeding and no signs of infection. Wound care.   DVT prophylaxis:Lovenox Code Status: DNR Family Communication:None at bedside Disposition Plan:came from ALF, needs SNF, SW working on placement   Consultants:  Vascular surgery  Procedures:  Aortic Doppler ultrasound 11/20  Antimicrobials:  None  1 g Rocephin given in ED   Objective: Vitals:   08/24/19 2009 08/24/19 2216 08/25/19 0435 08/25/19 0738  BP: (!) 146/95 (!) 162/65 (!) 162/70 135/88  Pulse: (!) 120 75 (!) 51 64  Resp: 18  16 16   Temp: 98 F (36.7 C)  98.5 F (36.9 C) 98.4 F (36.9 C)  TempSrc: Oral  Oral Oral  SpO2: 90%  98% 100%  Weight:      Height:       No  intake or output data in the 24  hours ending 08/25/19 1238 Filed Weights   08/21/19 1548 08/22/19 0335 08/24/19 0541  Weight: 68.7 kg 72.1 kg 72.1 kg    Examination:  General exam: awake, alert, no acute distress HEENT: dry mucus membranes, hearing grossly normal  Respiratory system: clear to auscultation bilaterally, no wheezes, rales or rhonchi, normal respiratory effort. Cardiovascular system: normal S1/S2, RRR, no pedal edema.   Gastrointestinal system: soft, non-tender, non-distended abdomen Extremities: moves all, no edema, normal tone    Data Reviewed: I have personally reviewed following labs and imaging studies  CBC: Recent Labs  Lab 08/21/19 1612 08/23/19 0531  WBC 10.0 7.3  NEUTROABS  --  5.5  HGB 13.5 13.1  HCT 37.8* 36.9*  MCV 90.2 90.7  PLT 153 [id number]   Basic Metabolic Panel: Recent Labs  Lab 08/21/19 1612  NA 135  K 4.1  CL 102  CO2 23  GLUCOSE 126*  BUN 26*  CREATININE 0.62  CALCIUM 8.5*   GFR: Estimated Creatinine Clearance: 60.1 mL/min (by C-G formula based on SCr of 0.62 mg/dL). Liver Function Tests: No results for input(s): AST, ALT, ALKPHOS, BILITOT, PROT, ALBUMIN in the last 168 hours. No results for input(s): LIPASE, AMYLASE in the last 168 hours. No results for input(s): AMMONIA in the last 168 hours. Coagulation Profile: No results for input(s): INR, PROTIME in the last 168 hours. Cardiac Enzymes: No results for input(s): CKTOTAL, CKMB, CKMBINDEX, TROPONINI in the last 168 hours. BNP (last 3 results) No results for input(s): PROBNP in the last 8760 hours. HbA1C: No results for input(s): HGBA1C in the last 72 hours. CBG: No results for input(s): GLUCAP in the last 168 hours. Lipid Profile: No results for input(s): CHOL, HDL, LDLCALC, TRIG, CHOLHDL, LDLDIRECT in the last 72 hours. Thyroid Function Tests: No results for input(s): TSH, T4TOTAL, FREET4, T3FREE, THYROIDAB in the last 72 hours. Anemia Panel: No results for input(s): VITAMINB12, FOLATE, FERRITIN, TIBC,  IRON, RETICCTPCT in the last 72 hours. Sepsis Labs: No results for input(s): PROCALCITON, LATICACIDVEN in the last 168 hours.  Recent Results (from the past 240 hour(s))  Urine culture     Status: None   Collection Time: 08/21/19  5:07 PM   Specimen: Urine, Clean Catch  Result Value Ref Range Status   Specimen Description   Final    URINE, CLEAN CATCH Performed at [facility], [address] [place], [place] [address]    Special Requests   Final    Normal Performed at [facility], [address] [place], [place] [address]    Culture   Final    NO GROWTH Performed at [facility], [place] [address], [place], [place] [address]    Report Status 08/23/2019 FINAL  Final  SARS CORONAVIRUS 2 (TAT 6-24 HRS) Nasopharyngeal Nasopharyngeal Swab     Status: None   Collection Time: 08/21/19  6:45 PM   Specimen: Nasopharyngeal Swab  Result Value Ref Range Status   SARS Coronavirus 2 NEGATIVE NEGATIVE Final    Comment: (NOTE) SARS-CoV-2 target nucleic acids are NOT DETECTED. The SARS-CoV-2 RNA is generally detectable in upper and lower respiratory specimens during the acute phase of infection. Negative results do not preclude SARS-CoV-2 infection, do not rule out co-infections with other pathogens, and should not be used as the sole basis for treatment or other patient management decisions. Negative results must be combined with clinical observations, patient history, and epidemiological information. The expected result is Negative. Fact  Sheet for Patients: SugarRoll.be Fact Sheet for Healthcare Providers: https://www.woods-mathews.com/ This test is not yet approved or cleared by the [place] FDA and  has been authorized for detection and/or diagnosis of SARS-CoV-2 by FDA under an Emergency Use Authorization (EUA). This EUA will remain  in effect (meaning this test can be used) for the duration of the COVID-19  declaration under Section 56 4(b)(1) of the Act, 21 U.S.C. section 360bbb-3(b)(1), unless the authorization is terminated or revoked sooner. Performed at [facility], [place] [address], [place], [place] [address]   MRSA PCR Screening     Status: None   Collection Time: 08/22/19 10:55 PM   Specimen: Nasopharyngeal  Result Value Ref Range Status   MRSA by PCR NEGATIVE NEGATIVE Final    Comment:        The GeneXpert MRSA Assay (FDA approved for NASAL specimens only), is one component of a comprehensive MRSA colonization surveillance program. It is not intended to diagnose MRSA infection nor to guide or monitor treatment for MRSA infections. Performed at [facility], [address], [place], [place] [address]          Radiology Studies: No results found.      Scheduled Meds: . donepezil  5 mg Oral QHS  . enoxaparin (LOVENOX) injection  40 mg Subcutaneous Q24H  . Melatonin  5 mg Oral QHS  . QUEtiapine  25 mg Oral BID  . senna-docusate  2 tablet Oral Daily  . sertraline  50 mg Oral Daily  . sodium chloride flush  3 mL Intravenous Q12H   Continuous Infusions:   LOS: 3 days    Time spent: 25 minutes    Ezekiel Slocumb, DO [facility] Pager: [phone]  If 7PM-7AM, please contact night-coverage [email or web address] Password [facility] 08/25/2019, 12:38 PM

## 2019-08-25 NOTE — Clinical Social Work Note (Signed)
Left another voicemail for daughter/legal guardian.  Dayton Scrape, [place]

## 2019-08-26 DIAGNOSIS — Z515 Encounter for palliative care: Secondary | ICD-10-CM

## 2019-08-26 DIAGNOSIS — R531 Weakness: Secondary | ICD-10-CM

## 2019-08-26 DIAGNOSIS — I714 Abdominal aortic aneurysm, without rupture: Secondary | ICD-10-CM

## 2019-08-26 DIAGNOSIS — Z8546 Personal history of malignant neoplasm of prostate: Secondary | ICD-10-CM

## 2019-08-26 DIAGNOSIS — R1900 Intra-abdominal and pelvic swelling, mass and lump, unspecified site: Secondary | ICD-10-CM

## 2019-08-26 DIAGNOSIS — N3001 Acute cystitis with hematuria: Secondary | ICD-10-CM

## 2019-08-26 DIAGNOSIS — Z66 Do not resuscitate: Secondary | ICD-10-CM

## 2019-08-26 DIAGNOSIS — Z7189 Other specified counseling: Secondary | ICD-10-CM

## 2019-08-26 LAB — SARS CORONAVIRUS 2 (TAT 6-24 HRS): SARS Coronavirus 2: NEGATIVE

## 2019-08-26 LAB — CREATININE, SERUM
Creatinine, Ser: 0.56 mg/dL — ABNORMAL LOW (ref 0.61–1.24)
GFR calc Af Amer: >60 mL/min (ref >60)
GFR calc non Af Amer: >60 mL/min (ref >60)

## 2019-08-26 MED ORDER — HALOPERIDOL LACTATE 5 MG/ML IJ SOLN: 0.5000 mg | INTRAMUSCULAR | Status: DC | PRN

## 2019-08-26 MED ORDER — SODIUM CHLORIDE 0.45 % IV SOLN
INTRAVENOUS | Status: DC
  Administered 2019-08-26: 15:00:00 via INTRAVENOUS

## 2019-08-26 MED ORDER — BIOTENE DRY MOUTH MT LIQD: 15.0000 mL | OROMUCOSAL | Status: DC | PRN

## 2019-08-26 MED ORDER — GLYCOPYRROLATE 0.2 MG/ML IJ SOLN
0.3000 mg | INTRAMUSCULAR | Status: DC | PRN
  Filled 2019-08-26: qty 1.5

## 2019-08-26 MED ORDER — MORPHINE SULFATE (PF) 2 MG/ML IV SOLN: 1.0000 mg | INTRAVENOUS | Status: DC | PRN

## 2019-08-26 MED ORDER — POLYVINYL ALCOHOL 1.4 % OP SOLN
1.0000 [drp] | Freq: Four times a day (QID) | OPHTHALMIC | Status: DC | PRN
  Filled 2019-08-26: qty 15

## 2019-08-26 MED ORDER — ONDANSETRON HCL 4 MG/2ML IJ SOLN: 4.0000 mg | Freq: Four times a day (QID) | INTRAMUSCULAR | Status: DC | PRN

## 2019-08-26 MED ORDER — MORPHINE SULFATE (CONCENTRATE) 10 MG/0.5ML PO SOLN
5.0000 mg | ORAL | Status: DC | PRN
  Filled 2019-08-26 (×2): qty 0.5

## 2019-08-26 MED ORDER — ACETAMINOPHEN 650 MG RE SUPP: 650.0000 mg | Freq: Four times a day (QID) | RECTAL | Status: DC | PRN

## 2019-08-26 MED ORDER — SENNOSIDES-DOCUSATE SODIUM 8.6-50 MG PO TABS: 2.0000 | ORAL_TABLET | Freq: Every day | ORAL | Status: DC | PRN

## 2019-08-26 MED ORDER — MORPHINE SULFATE (CONCENTRATE) 10 MG/0.5ML PO SOLN
5.0000 mg | ORAL | Status: DC | PRN
  Administered 2019-08-31 (×3): 5 mg via SUBLINGUAL
  Filled 2019-08-26: qty 0.5

## 2019-08-26 NOTE — Progress Notes (Signed)
Physical Therapy Treatment Patient Details Name: Carlos Hayden. MRN: [id number] DOB: [birth year] Today's Date: 08/26/2019    History of Present Illness Carlos Hayden. is a 85 y.o. male with medical history significant for dementia, hypertension, prostate cancer in remission, htn, AAA, and bradycardia, presents with confusion.    PT Comments    Pt lethargic.  Tech stated he is not combative this am.  Pt allowed supine AAROM BLE - he did not assist nor resist activity but remained with eyes closed.  Would talk at times but speech garbled and unable to understand him.  Exercises did not awaken pt for mobility so it was deferred a this time for pt and staff safety.   Follow Up Recommendations  SNF     Equipment Recommendations       Recommendations for Other Services       Precautions / Restrictions Restrictions Weight Bearing Restrictions: No          Cognition Arousal/Alertness: Lethargic Behavior During Therapy: WFL for tasks assessed/performed Overall Cognitive Status: History of cognitive impairments - at baseline                                        Exercises Other Exercises Other Exercises: BLE AAROM x 10 for all ranges in supine, - did not assist or resist movement    General Comments        Pertinent Vitals/Pain Pain Assessment: No/denies pain    Home Living                      Prior Function            PT Goals (current goals can now be found in the care plan section) Progress towards PT goals: Not progressing toward goals - comment    Frequency    Min 2X/week      PT Plan Current plan remains appropriate    Co-evaluation              AM-PAC PT "6 Clicks" Mobility   Outcome Measure  Help needed turning from your back to your side while in a flat bed without using bedrails?: Total Help needed moving from lying on your back to sitting on the side of a flat bed without using  bedrails?: Total Help needed moving to and from a bed to a chair (including a wheelchair)?: Total Help needed standing up from a chair using your arms (e.g., wheelchair or bedside chair)?: Total Help needed to walk in hospital room?: Total Help needed climbing 3-5 steps with a railing? : Total 6 Click Score: 6    End of Session   Activity Tolerance: Patient limited by lethargy Patient left: in bed;with bed alarm set Nurse Communication: Mobility status       Time: [id number] PT Time Calculation (min) (ACUTE ONLY): 8 min  Charges:  $Therapeutic Exercise: 8-22 mins                    Chesley Noon, PTA 08/26/19, 11:01 AM'

## 2019-08-26 NOTE — Care Management Important Message (Signed)
Important Message  Patient Details  Name: Carlos Hayden. MRN: [id number] Date of Birth: 04/22/1926   Medicare Important Message Given:  Yes  Reviewed verbally with Francene Castle, legal guardian.  Aware of right.  Copy of signed Medicare IM sent securely to email address provided: [email or web address]   Dannette Barbara 08/26/2019, 12:25 PM

## 2019-08-26 NOTE — TOC Initial Note (Signed)
Transition of Care (TOC) - Initial/Assessment Note    Patient Details  Name: Carlos Hayden. MRN: [id number] Date of Birth: [birth year]  Transition of Care [facility]) CM/SW Contact:    Candie Chroman, LCSW Phone Number: 08/26/2019, 1:52 PM  Clinical Narrative:  CSW called patient's daughter, introduced role, and inquired about interest in hospice following her discussion with palliative NP. Patient's daughter does not want hospice services at the ALF. She prefers the [facility]. CSW made referral to Flo Shanks, RN. No bed available. Patient's daughter will be here either tomorrow morning or Friday morning. No further concerns. CSW encouraged patient's daughter to contact CSW as needed. CSW will continue to follow patient and his daughter for support and facilitate discharge to hospice facility, if approved, once bed available.             Expected Discharge Plan: [place] Barriers to Discharge: Hospice Bed not available   Patient Goals and CMS Choice Patient states their goals for this hospitalization and ongoing recovery are:: Patient not fully oriented.      Expected Discharge Plan and Services Expected Discharge Plan: [place] Choice: Hospice Living arrangements for the past 2 months: [place]                                      Prior Living Arrangements/Services Living arrangements for the past 2 months: [place] Lives with:: Spouse, Facility Resident Patient language and need for interpreter reviewed:: Yes Do you feel safe going back to the place where you live?: Yes      Need for Family Participation in Patient Care: Yes (Comment) Care giver support system in place?: Yes (comment)   Criminal Activity/Legal Involvement Pertinent to Current Situation/Hospitalization: No - Comment as needed  Activities of Daily Living      Permission  Sought/Granted Permission sought to share information with : Facility Art therapist granted to share information with : Yes, Verbal Permission Granted  Share Information with NAME: Jesaiah Tomme  Permission granted to share info w AGENCY: [facility], [place] granted to share info w Relationship: Daughter/legal guardian  Permission granted to share info w Contact Information: [phone]  Emotional Assessment Appearance:: Appears stated age Attitude/Demeanor/Rapport: Unable to Assess Affect (typically observed): Unable to Assess Orientation: : Oriented to Self Alcohol / Substance Use: Not Applicable Psych Involvement: No (comment)  Admission diagnosis:  Weakness [R53.1] Acute cystitis with hematuria [N30.01] Pulsatile abdominal mass [R19.00] Abdominal aortic aneurysm (AAA) greater than 5.5 cm in diameter in male [facility]) [I71.4] Patient Active Problem List   Diagnosis Date Noted  . AMS (altered mental status) 08/22/2019  . Acute metabolic encephalopathy [id number]  . Change in mental status 07/02/2018  . Acute viral syndrome 10/08/2017  . Thrombocytopenia ([place]) 07/07/2017  . Pancreatic mass 07/06/2017  . Aneurysm of infrarenal abdominal aorta (HCC) 07/06/2017  . Memory change 03/15/2017  . Unsteady gait 07/11/2014  . Essential hypertension 05/25/2008  . Hypercholesterolemia 04/14/2008  . History of prostate cancer 04/14/2008  . VASOVAGAL SYNCOPE 08/05/2007   PCP:  No primary care provider on file. Pharmacy:   [facility], [address] [place] [address] Phone: [phone] Fax: [place], [address] [place]  [place] [address] Phone: [phone] Fax: [place], [address] [place] [address] Phone: [phone] Fax:  [phone]     Social Determinants of Health (SDOH) Interventions    Readmission Risk Interventions No flowsheet data found.

## 2019-08-26 NOTE — Progress Notes (Signed)
Palliative Note:  Referral received for goals of care. Voicemail left for daughter/legal guardian x2 today thus far ([phone] and [phone]) with multiple phone numbers to get back into contact with me. Voicemail indicates that phone belongs to [facility]. Will await return call.   Detailed notes and recommendations to follow [place] with family/guardian.  Thank you for your referral and allowing Palliative to assist in Carlos Hayden's care.   Alda Lea, AGPCNP-BC Palliative Medicine Team  Phone: [phone] Pager: [phone] Amion: N. [place]   NO CHARGE

## 2019-08-26 NOTE — Progress Notes (Signed)
New referral for [facility] home received from [place]. Telephone call to patient's daughter Augusto Garbe Tharon Aquas) [place] to advise that a bed would not be available at the hospice home until Monday. Patient is also pending Market researcher approval. CSW [place] Scrape updated. Augusto Garbe is planning on being at [facility] on Friday, plan is to meet and discuss hospice home services at that time. Writer's contact number given to [employer]. Patient information faxed to referral. Flo Shanks BSN, RN, [place] [phone]

## 2019-08-26 NOTE — Consult Note (Signed)
Consultation Note Date: 08/26/2019   Patient Name: Taelin Scatena [facility].  DOB: Jul 29, 1926  MRN: [id number]  Age / Sex: 85 y.o., male   PCP: No primary care provider on file. Referring Physician: Nolberto Hanlon, MD   REASON FOR CONSULTATION:Establishing goals of care  Palliative Care consult requested for goals of care in this 85 y.o. male with multiple medical problems including dementia, hypertension, prostate cancer in remission, htn, AAA, and bradycardia.  Patient was brought to ED from [facility] after found in chair possibly unconscious. He has experienced multiple falls over the past month resulting in 2 recent ED visits. Patient was noted on work-up to have bradycardia (50s). CT of head and chest x-ray showed no acute changes. Abdominal ultrasound given on ED providers assessment noted pulsatile abdominal mass. Ultrasound showed 6.2 cm abdominal aortic aneurysm up from 5 cm previously. Vascular surgery was consulted and recommended no intervention given patient's advanced age with dementia and high risk for morbidity mortality with any intervention.  Patient initiated on IV hydration due to dehydration.  Since admission patient continues to have poor nutritional appetite, lethargy, and deconditioned.   Clinical Assessment and Goals of Care: I have reviewed medical records including lab results, imaging, Epic notes, and MAR, received report from the bedside RN, and assessed the patient. I spoke with patient's daughter, Jayceon Erdos via phone conference  to discuss diagnosis prognosis, [place], EOL wishes, disposition and options. Patient somewhat lethargic. He will not follow commands or respond to verbal stimuli. Some garbled incomprehensible words noted.   I introduced myself and Palliative Medicine as specialized medical care for people living with serious illness to Mrs. Broder. Expressing Palliative focuses on providing relief from the symptoms and stress of a serious  illness. The goal is to improve quality of life for both the patient and the family. She verbalized understanding and appreciation of our involvement.   We discussed a brief life review of the patient, along with his functional and nutritional status.  Mother reports patient has been a resident of [facility] 2018.  Both patient and his wife reside at facility in the same room.  They have been married for more than 22 years and Augusto Garbe is the only child. 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   Prior to admission Augusto Garbe (who resides in [facility]) reports she would visit her parents every 2 weeks prior to [place] however, she has not been able to see them since March because of the restrictions and pandemic. She reports both of her parents suffer from dementia, however her mother is more functional and aware of events compared to Mr. Burgio. Augusto Garbe reports patient 6 weeks ago recognized her and was able to have a discussion somewhat appropriate with her via phone. She reports a reported decline in health per facility staff over the past several weeks.  She reports patient continuing to have falls sometimes falling several times in the same day and more frequent wandering.  She states he has a significant decrease in his appetite as reported by facility staff.  Patient often would eat only a few bites each day and sometimes would not eat anything and also refused to drink.  We discussed His current illness and what it means in the larger context of His on-going co-morbidities. With specific discussions regarding Mr. Shoffners advancing dementia, AAA, and his overall functional and nutritional decline.  Natural disease trajectory and expectations at EOL were discussed.  Mrs. Escalon states she was originally unaware her father was still hospitalized. She thought he went back to [place] after his ED work-up. She then becomes tearful expressing she was somewhat fearful to return calls  as she "dreaded conversations and felt that she would be told he had passed away knowing he had declined so much!" Therapeutic listening and support provided.   Welsa expressed awareness and fear of her fathers falls and also of the increase in size of his AAA. She states given his age and poor health she did not want him to receive surgical intervention sharing her grandfather who lived to be 66 years old died during a procedure. She understands the severity of her father's AAA and the high risk of mortality and sudden death.   I attempted to elicit values and goals of care important to the patient.    The difference between aggressive medical intervention and comfort care was considered in light of the patient's goals of care. Mrs. Roell reports her awareness of her father's poor quality of life and advanced dementia. She understands that he is not and has not been eating or drinking over the past several weeks. I educated daughter in detail on artifical feeding/PEG and use in older adults with dementia. She verbalized understanding. Augusto Garbe states she does not want him to be forced to eat or drink expressing "if my father could sit here and tell me what he would want he would say no I don't want a feeding tube and that he was ready to go on to be with the [employer] given.   Patient does not have a documented advanced directive per daughter but has always made his wishes known to her. She confirms DNR/DNI.   Given daughter's expressed goals of care for her father, Hospice services outpatient were explained and offered. We discussed ability for patient to return to [place] with outpatient hospice support. Augusto Garbe verbalized understanding expressing she did not think her father could go back to facility and also expressing wishes for him not to pass away there with his wife with fear she would also pass away after realizing what was happening. Support given. We discussed potential for patient to be  placed in a different room however, she did not think this was an option due to no bed availability. Mrs. Merriott expressed her awareness and interactions with the hospice home in [place]. She is requesting consideration for patient to placed there for end-of-life care. I explained the criteria and process of referral, approval, and transfer to the hospice home. Mrs. Roa verbalized her understanding and awareness of hospice's goals and philosophy of care.   Mrs. Ridge expressed plans to travel up to be with her father tomorrow or first thing Friday morning. She requested again to proceed with outpatient hospice support.   Questions and concerns were addressed. The family was encouraged to call with questions or concerns.  PMT will continue to support holistically.  I received a return call from Mrs. Bram Hasser expressing concerns with her father going back to [facility] facility. She express her wishes for her father to be considered for hospice home placement in [place]. I confirmed with Mrs. Dubin wishes for her full comfort care educating her again on the difference between aggressive and comfort. She verbalized understanding expressing tearfully her wishes for him to be kept comfortable. Support given. I educated daughter on what comfort care would look like and that CM/CSW will work on hospice home approval and placement.  Augusto Garbe is aware that patient will remain hospitalized until a bed is available at hospice as she has requested once approved. She again verbalized understanding and appreciation, with noting plans to come and be at the bedside with patient tomorrow or early Friday.   SOCIAL HISTORY:     reports that he has quit smoking. He has never used smokeless tobacco. He reports that he does not drink alcohol or use drugs.  CODE STATUS: DNR  ADVANCE DIRECTIVES: Francene Castle (daughter/guardian)   SYMPTOM MANAGEMENT: see below   Palliative Prophylaxis:   Aspiration,  Bowel Regimen, Delirium Protocol, Eye Care, Frequent Pain Assessment, Oral Care, Palliative Wound Care and Turn Reposition  PSYCHO-SOCIAL/SPIRITUAL:  Support System: Family   Desire for further Chaplaincy support NO   Additional Recommendations (Limitations, Scope, Preferences):  Full Comfort Care   PAST MEDICAL HISTORY: Past Medical History:  Diagnosis Date   Cancer ([place]) 11/28/10   Prostate   Dementia ([place])    Hypertension     PAST SURGICAL HISTORY:  Past Surgical History:  Procedure Laterality Date   TONSILLECTOMY  1930's   TOTAL KNEE ARTHROPLASTY     right    ALLERGIES:  has No Known Allergies.   MEDICATIONS:  Current Facility-Administered Medications  Medication Dose Route Frequency Provider Last Rate Last Dose   0.45 % sodium chloride infusion   Intravenous Continuous Nolberto Hanlon, MD 50 mL/hr at 08/26/19 1526     donepezil (ARICEPT) tablet 5 mg  5 mg Oral QHS Gwynne Edinger, MD   5 mg at 08/25/19 2038   enoxaparin (LOVENOX) injection 40 mg  40 mg Subcutaneous Q24H Gwynne Edinger, MD   40 mg at 08/25/19 2038   Melatonin TABS 5 mg  5 mg Oral QHS Gwynne Edinger, MD   5 mg at 08/25/19 2038   QUEtiapine (SEROQUEL) tablet 25 mg  25 mg Oral BID Gwynne Edinger, MD   25 mg at 08/26/19 [id number]   senna-docusate (Senokot-S) tablet 2 tablet  2 tablet Oral Daily Gwynne Edinger, MD   2 tablet at 08/26/19 [id number]   sertraline (ZOLOFT) tablet 50 mg  50 mg Oral Daily Gwynne Edinger, MD   50 mg at 08/26/19 [id number]   sodium chloride flush (NS) 0.9 % injection 3 mL  3 mL Intravenous Q12H Nicole Kindred A, DO   3 mL at 08/26/19 0921    VITAL SIGNS: BP (!) 126/97 (BP Location: Left Arm)    Pulse 69    Temp 98.7 F (37.1 C) (Oral)    Resp 19    Ht 5\' 9"  (1.753 m)    Wt 72.1 kg    SpO2 94%    BMI 23.48 kg/m  Filed Weights   08/21/19 1548 08/22/19 0335 08/24/19 0541  Weight: 68.7 kg 72.1 kg 72.1 kg    Estimated body mass index is 23.48 kg/m as  calculated from the following:   Height as of this encounter: 5\' 9"  (1.753 m).   Weight as of this encounter: 72.1 kg.  LABS: CBC:    Component Value Date/Time   WBC 7.3 08/23/2019 0531   HGB 13.1 08/23/2019 0531   HGB 14.6 10/26/2014 1226   HCT 36.9 (L) 08/23/2019 0531   HCT 43.0 10/26/2014 1226   PLT 168 08/23/2019 0531   PLT 139 (L) 10/26/2014 1226   Comprehensive Metabolic Panel:    Component Value Date/Time   NA 135 08/21/2019 1612   NA 142 10/26/2014 1226   K 4.1 08/21/2019  1612   K 4.2 10/26/2014 1226   CO2 23 08/21/2019 1612   CO2 30 10/26/2014 1226   BUN 26 (H) 08/21/2019 1612   BUN 24 (H) 10/26/2014 1226   CREATININE 0.56 (L) 08/26/2019 0649   CREATININE 0.74 12/05/2018 1544   ALBUMIN 3.8 08/15/2019 1932     Review of Systems  Unable to perform ROS: Dementia   Physical Exam General: NAD, lethargic, frail chronically-ill appearing Cardiovascular: regular rate and rhythm Pulmonary: diminished bases Abdomen: soft, nontender, + bowel sounds Extremities: no edema, no joint deformities Skin: no rashes, thin, dry, laceration s/p fall Neurological: lethargic, unable to assess mentation, dementia, will not follow commands, incomprehensible words, mumbling    Prognosis: < 2 weeks in the setting poor po intake, malnutrition, dehydration, advanced end-stage dementia, bradycardia, lethargy, deconditioned, recurrent falls, abdominal aortic aneurysm 6.2 cm pulsating (not a surgical candidate), decubitus ulcer, superficial lacerations, hypertension.   Discharge Planning:  Hospice facility at daughter's request  Recommendations:  DNR/DNI-as confirmed by daughter  Transition to full comfort/EOL care as requested by daughter/guardian  Requesting hospice home referral [facility] Roch aware and currently arranging)  Daughter planning to come and be with patient tomorrow or first thing Friday morning.   Will DC orders not comfort focused  Morphine as needed for pain/air  hunger  Robinul as needed for excessive secretions  Ativan as needed for agitation/anxiety  Liquifilm Tears as needed for excessive dry eyes  We will continue current medications with a comfort focus.  If patient unable to take may DC.  PMT will continue to support and follow.  No coverage on tomorrow.  Will return on service Friday.   Palliative Performance Scale: PPS 10-20% (bites and sips x5 days and prior to admission)               Francene Castle, daughter, expressed understanding and was in agreement with this plan.   Thank you for allowing the Palliative Medicine Team to assist in the care of this patient.  Time: 1210-1300 Time: 1415-1435 Time Total: 70 min.   Visit consisted of counseling and education dealing with the complex and emotionally intense issues of symptom management and palliative care in the setting of serious and potentially life-threatening illness.Greater than 50%  of this time was spent counseling and coordinating care related to the above assessment and plan.  Signed by:  Alda Lea, AGPCNP-BC Palliative Medicine Team  Phone: [phone] Fax: [phone] Pager: [phone] Amion: Bjorn Pippin

## 2019-08-26 NOTE — Progress Notes (Signed)
PROGRESS NOTE    [place].  WL:[id number] DOB: [birth year] DOA: 08/21/2019 PCP: No primary care provider on file.    Brief Narrative:  85 y.o.malewith medical history significant foradvanceddementia, hypertension, prostate cancer in remission, htn, AAA, and bradycardia, presentsafter being found at SNF unresponsive in a chair.Two prior ED visits for falls this month.Per report, patient has had no fevers or respiratory symptoms points of dysuria, no vomiting or diarrhea. No recent med changes. In the ED, patient afebrile bradycardic in 50s and mildly hypertensive. Labs are unremarkable. UA showed moderate LE, 11-20 WBCs, bacteria. Treated with Rocephin and urine culture pending.CT of head negative. Chest x-ray negative. Pulsatile abdominal mass noted on exam,therefore ultrasound was obtained and showed 6.2 cm abdominal aortic aneurysm up from 5 cm previously. Vascular surgery was consulted and recommended no intervention given patient's advanced age with dementia and high risk for morbidity mortality with any intervention. Patient was admitted initially for observation, telemetry and IV hydration.Holding further antibiotics pending urine culture.  Awaiting placement but patient's daughter and guardian has not been returning SW calls to assist with disposition.  Palliative consult as patient not eating or drinking this admission, goals of care discussion warranted.    Consultants:   Hospice and palliative care  Vascular surgery  Procedures: Aortic Doppler ultrasound 11/20  Antimicrobials:   None, 1 g Rocephin given in ED   Subjective:.Laying in bed. NAD. He denies pain, sob, or any other sx. Was told by nsg pt not eating much.  Objective: Vitals:   08/25/19 1932 08/26/19 0540 08/26/19 0833 08/26/19 1557  BP: 133/89 (!) 150/92 (!) 126/97 (!) 160/92  Pulse: 73 65 69 68  Resp: 20 20 19 19   Temp: 98.2 F (36.8 C) 98.4 F (36.9 C) 98.7 F  (37.1 C)   TempSrc: Oral Oral Oral   SpO2: 100% 96% 94% 96%  Weight:      Height:        Intake/Output Summary (Last 24 hours) at 08/26/2019 1707 Last data filed at 08/26/2019 1237 Gross per 24 hour  Intake -  Output 0 ml  Net 0 ml   Filed Weights   08/21/19 1548 08/22/19 0335 08/24/19 0541  Weight: 68.7 kg 72.1 kg 72.1 kg    Examination:  General exam: laying in bed, nad. Difficult to examine the pt Respiratory system: Clear to auscultation, with poor respiratory effort Cardiovascular system: S1 & S2 heard, RRR.   Gastrointestinal system: soft, nt/nd +bs Central nervous system: Difficult to assess, appears confused.  Extremities: no edema Skin: warm, dry Psychiatry:unable to assess     Data Reviewed: I have personally reviewed following labs and imaging studies  CBC: Recent Labs  Lab 08/21/19 1612 08/23/19 0531  WBC 10.0 7.3  NEUTROABS  --  5.5  HGB 13.5 13.1  HCT 37.8* 36.9*  MCV 90.2 90.7  PLT 153 [id number]   Basic Metabolic Panel: Recent Labs  Lab 08/21/19 1612 08/26/19 0649  NA 135  --   K 4.1  --   CL 102  --   CO2 23  --   GLUCOSE 126*  --   BUN 26*  --   CREATININE 0.62 0.56*  CALCIUM 8.5*  --    GFR: Estimated Creatinine Clearance: 60.1 mL/min (A) (by C-G formula based on SCr of 0.56 mg/dL (L)). Liver Function Tests: No results for input(s): AST, ALT, ALKPHOS, BILITOT, PROT, ALBUMIN in the last 168 hours. No results for input(s): LIPASE, AMYLASE in the last 168 hours. No  results for input(s): AMMONIA in the last 168 hours. Coagulation Profile: No results for input(s): INR, PROTIME in the last 168 hours. Cardiac Enzymes: No results for input(s): CKTOTAL, CKMB, CKMBINDEX, TROPONINI in the last 168 hours. BNP (last 3 results) No results for input(s): PROBNP in the last 8760 hours. HbA1C: No results for input(s): HGBA1C in the last 72 hours. CBG: No results for input(s): GLUCAP in the last 168 hours. Lipid Profile: No results for input(s):  CHOL, HDL, LDLCALC, TRIG, CHOLHDL, LDLDIRECT in the last 72 hours. Thyroid Function Tests: No results for input(s): TSH, T4TOTAL, FREET4, T3FREE, THYROIDAB in the last 72 hours. Anemia Panel: No results for input(s): VITAMINB12, FOLATE, FERRITIN, TIBC, IRON, RETICCTPCT in the last 72 hours. Sepsis Labs: No results for input(s): PROCALCITON, LATICACIDVEN in the last 168 hours.  Recent Results (from the past 240 hour(s))  Urine culture     Status: None   Collection Time: 08/21/19  5:07 PM   Specimen: Urine, Clean Catch  Result Value Ref Range Status   Specimen Description   Final    URINE, CLEAN CATCH Performed at [facility], [address], [place], [place] [address]    Special Requests   Final    Normal Performed at [facility], [address], [place], [place] [address]    Culture   Final    NO GROWTH Performed at [facility], [place] [address] [place], [place] [address]    Report Status 08/23/2019 FINAL  Final  SARS CORONAVIRUS 2 (TAT 6-24 HRS) Nasopharyngeal Nasopharyngeal Swab     Status: None   Collection Time: 08/21/19  6:45 PM   Specimen: Nasopharyngeal Swab  Result Value Ref Range Status   SARS Coronavirus 2 NEGATIVE NEGATIVE Final    Comment: (NOTE) SARS-CoV-2 target nucleic acids are NOT DETECTED. The SARS-CoV-2 RNA is generally detectable in upper and lower respiratory specimens during the acute phase of infection. Negative results do not preclude SARS-CoV-2 infection, do not rule out co-infections with other pathogens, and should not be used as the sole basis for treatment or other patient management decisions. Negative results must be combined with clinical observations, patient history, and epidemiological information. The expected result is Negative. Fact Sheet for Patients: SugarRoll.be Fact Sheet for Healthcare Providers: https://www.woods-mathews.com/ This test is not yet approved or  cleared by the [place] FDA and  has been authorized for detection and/or diagnosis of SARS-CoV-2 by FDA under an Emergency Use Authorization (EUA). This EUA will remain  in effect (meaning this test can be used) for the duration of the COVID-19 declaration under Section 56 4(b)(1) of the Act, 21 U.S.C. section 360bbb-3(b)(1), unless the authorization is terminated or revoked sooner. Performed at [facility], [place] [address], [place], [place] [address]   MRSA PCR Screening     Status: None   Collection Time: 08/22/19 10:55 PM   Specimen: Nasopharyngeal  Result Value Ref Range Status   MRSA by PCR NEGATIVE NEGATIVE Final    Comment:        The GeneXpert MRSA Assay (FDA approved for NASAL specimens only), is one component of a comprehensive MRSA colonization surveillance program. It is not intended to diagnose MRSA infection nor to guide or monitor treatment for MRSA infections. Performed at [facility], [place],  [address]   SARS CORONAVIRUS 2 (TAT 6-24 HRS) Nasopharyngeal Nasopharyngeal Swab     Status: None   Collection Time: 08/25/19  7:00 PM   Specimen:  Nasopharyngeal Swab  Result Value Ref Range Status   SARS Coronavirus 2 NEGATIVE NEGATIVE Final    Comment: (NOTE) SARS-CoV-2 target nucleic acids are NOT DETECTED. The SARS-CoV-2 RNA is generally detectable in upper and lower respiratory specimens during the acute phase of infection. Negative results do not preclude SARS-CoV-2 infection, do not rule out co-infections with other pathogens, and should not be used as the sole basis for treatment or other patient management decisions. Negative results must be combined with clinical observations, patient history, and epidemiological information. The expected result is Negative. Fact Sheet for Patients: SugarRoll.be Fact Sheet for Healthcare Providers: https://www.woods-mathews.com/ This  test is not yet approved or cleared by the [place] FDA and  has been authorized for detection and/or diagnosis of SARS-CoV-2 by FDA under an Emergency Use Authorization (EUA). This EUA will remain  in effect (meaning this test can be used) for the duration of the COVID-19 declaration under Section 56 4(b)(1) of the Act, 21 U.S.C. section 360bbb-3(b)(1), unless the authorization is terminated or revoked sooner. Performed at [facility], [place] [address], [place], [place] [address]          Radiology Studies: No results found.      Scheduled Meds: . donepezil  5 mg Oral QHS  . Melatonin  5 mg Oral QHS  . QUEtiapine  25 mg Oral BID  . sertraline  50 mg Oral Daily  . sodium chloride flush  3 mL Intravenous Q12H   Continuous Infusions: . sodium chloride 10 mL/hr at 08/26/19 1629    Assessment & Plan:   Principal Problem:   Acute metabolic encephalopathy Active Problems:   Essential hypertension   History of prostate cancer   Aneurysm of infrarenal abdominal aorta (HCC)   AMS (altered mental status)   Acute encephalopathy-unclear etiology. Suspect this is secondary to advanced dementia with waxing and waning periods of delirium. Patient afebrile and without leukocytosis. No signs of infection. Covid negative.  Ct head from 11/20 negative for acute abn. Urine culture negative, rules out UTI. Will D/CTelemetry Delirium precautions PT OT was ordered Will start gentle hydration as pt not eating much   Dementia, advanced -Continue home Aricept, Seroquel, Zoloft, melatonin -Hold as needed Ativan -Follow-up med list from SNF to confirm current meds --Palliative care following- see note -hospice consulted- see note  Recurrent falls-likely secondary to overall deconditioning of end-stage dementia -PT OT -Fall precautions  Abdominal aortic aneurysm Previously 5 cm, now 6.2 per ultrasound obtained on admission after abdominal mass was palpated  on exam. Vascular surgery was consulted and recommended no intervention due to patient's advanced age and advanced dementia, has high risk of significant morbidity mortality.  Hypertension-chronic -We will treat with as needed medications for now pending current med list from SNF  Stage I sacral decubitus ulcer-present on admission.Wound care.  Superficial lacerationsof the scalp, lower extremities. Due to recent falls.  Not bleeding and no signs of infection.  Wound care.   DVT prophylaxis:Lovenox Code Status: DNR Family Communication:None at bedside Disposition Plan:came from ALF. Now palliative care spoke to daughter who wants pt residential hospice and comfort care for patient. Hospice now consulted.          LOS: 4 days   Time spent: 45 min with more than >50% on coc    Nolberto Hanlon, MD [facility] Pager [phone]  If 7PM-7AM, please contact night-coverage [email or web address] Password [facility] 08/26/2019, 5:07 PM

## 2019-08-27 MED ORDER — AMLODIPINE BESYLATE 5 MG PO TABS
2.5000 mg | ORAL_TABLET | Freq: Every day | ORAL | Status: DC
  Administered 2019-08-27: 2.5 mg via ORAL
  Filled 2019-08-27: qty 1

## 2019-08-27 NOTE — Progress Notes (Signed)
PROGRESS NOTE    Carlos Hayden.  WL:[id number] DOB: July 14, 1926 DOA: 08/21/2019 PCP: No primary care provider on file.    Brief Narrative:  85 y.o.malewith medical history significant foradvanceddementia, hypertension, prostate cancer in remission, htn, AAA, and bradycardia, presentsafter being found at SNF unresponsive in a chair.Two prior ED visits for falls this month.Per report, patient has had no fevers or respiratory symptoms points of dysuria, no vomiting or diarrhea. No recent med changes. In the ED, patient afebrile bradycardic in 50s and mildly hypertensive. Labs are unremarkable. UA showed moderate LE, 11-20 WBCs, bacteria. Treated with Rocephin and urine culture pending.CT of head negative. Chest x-ray negative. Pulsatile abdominal mass noted on exam,therefore ultrasound was obtained and showed 6.2 cm abdominal aortic aneurysm up from 5 cm previously. Vascular surgery was consulted and recommended no intervention given patient's advanced age with dementia and high risk for morbidity mortality with any intervention. Patient was admitted initially for observation, telemetry and IV hydration.Holding further antibiotics pending urine culture.  Awaiting placement but patient's daughter and guardian has not been returning SW calls to assist with disposition.  Palliative consult as patient not eating or drinking this admission, goals of care discussion warranted.    Consultants:   Hospice and palliative care  Vascular surgery  Procedures: Aortic Doppler ultrasound 11/20  Antimicrobials:   None, 1 g Rocephin given in ED   Subjective: Pt denies sob, cp, or any sx. He is confused Objective: Vitals:   08/26/19 1557 08/26/19 1940 08/27/19 0435 08/27/19 0731  BP: (!) 160/92 137/76 (!) 153/78 (!) 132/91  Pulse: 68 67 66 67  Resp: 19 20 20    Temp:  98.3 F (36.8 C) (!) 97.4 F (36.3 C) 98 F (36.7 C)  TempSrc:  Oral Oral Oral  SpO2: 96% 96%  94% 100%  Weight:      Height:        Intake/Output Summary (Last 24 hours) at 08/27/2019 0737 Last data filed at 08/26/2019 1237 Gross per 24 hour  Intake -  Output 0 ml  Net 0 ml   Filed Weights   08/21/19 1548 08/22/19 0335 08/24/19 0541  Weight: 68.7 kg 72.1 kg 72.1 kg    Examination:  General exam: laying in bed, nad.  Respiratory system: Clear to auscultation, with poor respiratory effort, no r/r/w Cardiovascular system: S1 & S2 heard, RRR.   Gastrointestinal system: soft, nt/nd +bs Central nervous system: confused Extremities: no edema Skin: warm, dry Psychiatry:unable to assess     Data Reviewed: I have personally reviewed following labs and imaging studies  CBC: Recent Labs  Lab 08/21/19 1612 08/23/19 0531  WBC 10.0 7.3  NEUTROABS  --  5.5  HGB 13.5 13.1  HCT 37.8* 36.9*  MCV 90.2 90.7  PLT 153 [id number]   Basic Metabolic Panel: Recent Labs  Lab 08/21/19 1612 08/26/19 0649  NA 135  --   K 4.1  --   CL 102  --   CO2 23  --   GLUCOSE 126*  --   BUN 26*  --   CREATININE 0.62 0.56*  CALCIUM 8.5*  --    GFR: Estimated Creatinine Clearance: 60.1 mL/min (A) (by C-G formula based on SCr of 0.56 mg/dL (L)). Liver Function Tests: No results for input(s): AST, ALT, ALKPHOS, BILITOT, PROT, ALBUMIN in the last 168 hours. No results for input(s): LIPASE, AMYLASE in the last 168 hours. No results for input(s): AMMONIA in the last 168 hours. Coagulation Profile: No results for input(s): INR,  PROTIME in the last 168 hours. Cardiac Enzymes: No results for input(s): CKTOTAL, CKMB, CKMBINDEX, TROPONINI in the last 168 hours. BNP (last 3 results) No results for input(s): PROBNP in the last 8760 hours. HbA1C: No results for input(s): HGBA1C in the last 72 hours. CBG: No results for input(s): GLUCAP in the last 168 hours. Lipid Profile: No results for input(s): CHOL, HDL, LDLCALC, TRIG, CHOLHDL, LDLDIRECT in the last 72 hours. Thyroid Function Tests: No  results for input(s): TSH, T4TOTAL, FREET4, T3FREE, THYROIDAB in the last 72 hours. Anemia Panel: No results for input(s): VITAMINB12, FOLATE, FERRITIN, TIBC, IRON, RETICCTPCT in the last 72 hours. Sepsis Labs: No results for input(s): PROCALCITON, LATICACIDVEN in the last 168 hours.  Recent Results (from the past 240 hour(s))  Urine culture     Status: None   Collection Time: 08/21/19  5:07 PM   Specimen: Urine, Clean Catch  Result Value Ref Range Status   Specimen Description   Final    URINE, CLEAN CATCH Performed at [facility], [address] [place], [place] [address]    Special Requests   Final    Normal Performed at [facility], [address] [place], [place] [address]    Culture   Final    NO GROWTH Performed at [facility], [place] [address], [place], [place] [address]    Report Status 08/23/2019 FINAL  Final  SARS CORONAVIRUS 2 (TAT 6-24 HRS) Nasopharyngeal Nasopharyngeal Swab     Status: None   Collection Time: 08/21/19  6:45 PM   Specimen: Nasopharyngeal Swab  Result Value Ref Range Status   SARS Coronavirus 2 NEGATIVE NEGATIVE Final    Comment: (NOTE) SARS-CoV-2 target nucleic acids are NOT DETECTED. The SARS-CoV-2 RNA is generally detectable in upper and lower respiratory specimens during the acute phase of infection. Negative results do not preclude SARS-CoV-2 infection, do not rule out co-infections with other pathogens, and should not be used as the sole basis for treatment or other patient management decisions. Negative results must be combined with clinical observations, patient history, and epidemiological information. The expected result is Negative. Fact Sheet for Patients: SugarRoll.be Fact Sheet for Healthcare Providers: https://www.woods-mathews.com/ This test is not yet approved or cleared by the [place] FDA and  has been authorized for detection and/or diagnosis of  SARS-CoV-2 by FDA under an Emergency Use Authorization (EUA). This EUA will remain  in effect (meaning this test can be used) for the duration of the COVID-19 declaration under Section 56 4(b)(1) of the Act, 21 U.S.C. section 360bbb-3(b)(1), unless the authorization is terminated or revoked sooner. Performed at [facility], [place] [address], [place], [place] [address]   MRSA PCR Screening     Status: None   Collection Time: 08/22/19 10:55 PM   Specimen: Nasopharyngeal  Result Value Ref Range Status   MRSA by PCR NEGATIVE NEGATIVE Final    Comment:        The GeneXpert MRSA Assay (FDA approved for NASAL specimens only), is one component of a comprehensive MRSA colonization surveillance program. It is not intended to diagnose MRSA infection nor to guide or monitor treatment for MRSA infections. Performed at [facility], [place], [place] [address]   SARS CORONAVIRUS 2 (TAT 6-24 HRS) Nasopharyngeal Nasopharyngeal Swab     Status: None   Collection Time: 08/25/19  7:00 PM   Specimen: Nasopharyngeal Swab  Result Value Ref Range Status   SARS Coronavirus 2 NEGATIVE NEGATIVE Final  Comment: (NOTE) SARS-CoV-2 target nucleic acids are NOT DETECTED. The SARS-CoV-2 RNA is generally detectable in upper and lower respiratory specimens during the acute phase of infection. Negative results do not preclude SARS-CoV-2 infection, do not rule out co-infections with other pathogens, and should not be used as the sole basis for treatment or other patient management decisions. Negative results must be combined with clinical observations, patient history, and epidemiological information. The expected result is Negative. Fact Sheet for Patients: SugarRoll.be Fact Sheet for Healthcare Providers: https://www.woods-mathews.com/ This test is not yet approved or cleared by the [place] FDA and  has been authorized for  detection and/or diagnosis of SARS-CoV-2 by FDA under an Emergency Use Authorization (EUA). This EUA will remain  in effect (meaning this test can be used) for the duration of the COVID-19 declaration under Section 56 4(b)(1) of the Act, 21 U.S.C. section 360bbb-3(b)(1), unless the authorization is terminated or revoked sooner. Performed at [facility], [place] [address], [place], [place] [address]          Radiology Studies: No results found.      Scheduled Meds: . donepezil  5 mg Oral QHS  . Melatonin  5 mg Oral QHS  . QUEtiapine  25 mg Oral BID  . sertraline  50 mg Oral Daily  . sodium chloride flush  3 mL Intravenous Q12H   Continuous Infusions: . sodium chloride 10 mL/hr at 08/26/19 1629    Assessment & Plan:   Principal Problem:   Acute metabolic encephalopathy Active Problems:   Essential hypertension   History of prostate cancer   Aneurysm of infrarenal abdominal aorta (HCC)   AMS (altered mental status)   Acute encephalopathy-unclear etiology. Suspect this is secondary to advanced dementia with waxing and waning periods of delirium. Patient afebrile and without leukocytosis. No signs of infection. Covid negative.  Ct head from 11/20 negative for acute abn. Urine culture negative, rules out UTI. Will D/CTelemetry Delirium precautions Continue gentle ivf. PT rec. SNF  Dementia, advanced -Continue home Aricept, Seroquel, Zoloft, melatonin -Hold as needed Ativan -Follow-up med list from SNF to confirm current meds --Palliative care following- see note -hospice consulted- see note- plan for home hospice, but not available until monday  Recurrent falls-likely secondary to overall deconditioning of end-stage dementia -PT OT -Fall precautions  Abdominal aortic aneurysm Previously 5 cm, now 6.2 per ultrasound obtained on admission after abdominal mass was palpated on exam. Vascular surgery was consulted and recommended no intervention  due to patient's advanced age and advanced dementia, has high risk of significant morbidity mortality.  Hypertension-chronic current med list from SNF pending. However will start amlodipine 2.5 mg daily Monitor BP  Stage I sacral decubitus ulcer-present on admission. Continue wound care.  Superficial lacerationsof the scalp, lower extremities. Due to recent falls.  Not bleeding and no signs of infection.  Wound care.   DVT prophylaxis:Lovenox Code Status: DNR Family Communication:None at bedside Disposition Plan:came from ALF.  Possible DC on Monday with home hospice versus SNF with hospice       LOS: 5 days   Time spent: 45 min with more than >50% on coc    Nolberto Hanlon, MD [facility] Pager [phone]  If 7PM-7AM, please contact night-coverage [email or web address] Password [facility] 08/27/2019, 7:37 AM Patient ID: Carlos Clark., male   DOB: 09-26-1926, 85 y.o.   MRN: [id number]

## 2019-08-28 MED ORDER — AMLODIPINE BESYLATE 5 MG PO TABS: 5.0000 mg | ORAL_TABLET | Freq: Every day | ORAL | Status: DC

## 2019-08-28 MED ORDER — LORAZEPAM 2 MG/ML IJ SOLN: 1.0000 mg | INTRAMUSCULAR | Status: DC | PRN

## 2019-08-28 MED ORDER — HALOPERIDOL LACTATE 5 MG/ML IJ SOLN: 0.5000 mg | INTRAMUSCULAR | Status: DC | PRN

## 2019-08-28 MED ORDER — LORAZEPAM 2 MG/ML IJ SOLN
1.0000 mg | INTRAMUSCULAR | Status: DC | PRN
  Filled 2019-08-28: qty 1

## 2019-08-28 NOTE — Progress Notes (Signed)
Follow up visit made to new referral for [facility] home.  Patient seen lying in bed, appeared restless, continues with only bites and sips. Medications reviewed, patient has not received a PRN medications, discussed with Palliative NP Ardelle Park.  Writer spoke via telephone to patient's daughter Dallas Breeding, she still plans to come to [place] and may arrive today. Writer was able to initiate education regarding hospice services,. Philosophy, team approach to care and current visitatio policy with understanding voiced. Writer is unable to offer a bed at the hospice home today, [facility] and hospital care team aware.will be available on Monday.  Will continue to follow and update family and hospital care team. Flo Shanks BSN, RN, [place] [phone]

## 2019-08-28 NOTE — Progress Notes (Signed)
PROGRESS NOTE    [place].  [id number] DOB: 06-09-1926 DOA: 08/21/2019 PCP: No primary care provider on file.    Brief Narrative:  85 y.o.malewith medical history significant foradvanceddementia, hypertension, prostate cancer in remission, htn, AAA, and bradycardia, presentsafter being found at SNF unresponsive in a chair.Two prior ED visits for falls this month.Per report, patient has had no fevers or respiratory symptoms points of dysuria, no vomiting or diarrhea. No recent med changes. In the ED, patient afebrile bradycardic in 50s and mildly hypertensive. Labs are unremarkable. UA showed moderate LE, 11-20 WBCs, bacteria. Treated with Rocephin and urine culture pending.CT of head negative. Chest x-ray negative. Pulsatile abdominal mass noted on exam,therefore ultrasound was obtained and showed 6.2 cm abdominal aortic aneurysm up from 5 cm previously. Vascular surgery was consulted and recommended no intervention given patient's advanced age with dementia and high risk for morbidity mortality with any intervention. Patient was admitted initially for observation, telemetry and IV hydration.Holding further antibiotics pending urine culture.  Awaiting placement but patient's daughter and guardian has not been returning SW calls to assist with disposition.  Palliative consult as patient not eating or drinking this admission, goals of care discussion warranted.    Consultants:   Hospice and palliative care  Vascular surgery  Procedures: Aortic Doppler ultrasound 11/20  Antimicrobials:   None, 1 g Rocephin given in ED   Subjective: Patient laying in bed not answering my questions no acute distress Objective: Vitals:   08/27/19 1642 08/27/19 2018 08/28/19 0424 08/28/19 0734  BP: 130/88 131/84 (!) 123/99 (!) 168/95  Pulse: 90 82 67 65  Resp:  18 20 18   Temp: 97.7 F (36.5 C) 98.1 F (36.7 C) 97.9 F (36.6 C) 97.9 F (36.6 C)  TempSrc:  Oral Oral Oral   SpO2: 99% 92% 100% 98%  Weight:   81.6 kg   Height:        Intake/Output Summary (Last 24 hours) at 08/28/2019 1238 Last data filed at 08/27/2019 2146 Gross per 24 hour  Intake 20 ml  Output -  Net 20 ml   Filed Weights   08/22/19 0335 08/24/19 0541 08/28/19 0424  Weight: 72.1 kg 72.1 kg 81.6 kg    Examination:  General exam: laying in bed, nad.  Respiratory system: Clear to auscultation anteriorly, with poor respiratory effort, no r/r/w Cardiovascular system: S1 & S2  RRR.  Murmurs gallops Gastrointestinal system: soft, nt/nd +bs Central nervous system: confused  Extremities: no edema Skin: warm, dry Psychiatry:unable to assess , not cooperative with exam    Data Reviewed: I have personally reviewed following labs and imaging studies  CBC: Recent Labs  Lab 08/21/19 1612 08/23/19 0531  WBC 10.0 7.3  NEUTROABS  --  5.5  HGB 13.5 13.1  HCT 37.8* 36.9*  MCV 90.2 90.7  PLT 153 [id number]   Basic Metabolic Panel: Recent Labs  Lab 08/21/19 1612 08/26/19 0649  NA 135  --   K 4.1  --   CL 102  --   CO2 23  --   GLUCOSE 126*  --   BUN 26*  --   CREATININE 0.62 0.56*  CALCIUM 8.5*  --    GFR: Estimated Creatinine Clearance: 60.1 mL/min (A) (by C-G formula based on SCr of 0.56 mg/dL (L)). Liver Function Tests: No results for input(s): AST, ALT, ALKPHOS, BILITOT, PROT, ALBUMIN in the last 168 hours. No results for input(s): LIPASE, AMYLASE in the last 168 hours. No results for input(s): AMMONIA in the  last 168 hours. Coagulation Profile: No results for input(s): INR, PROTIME in the last 168 hours. Cardiac Enzymes: No results for input(s): CKTOTAL, CKMB, CKMBINDEX, TROPONINI in the last 168 hours. BNP (last 3 results) No results for input(s): PROBNP in the last 8760 hours. HbA1C: No results for input(s): HGBA1C in the last 72 hours. CBG: No results for input(s): GLUCAP in the last 168 hours. Lipid Profile: No results for input(s): CHOL, HDL,  LDLCALC, TRIG, CHOLHDL, LDLDIRECT in the last 72 hours. Thyroid Function Tests: No results for input(s): TSH, T4TOTAL, FREET4, T3FREE, THYROIDAB in the last 72 hours. Anemia Panel: No results for input(s): VITAMINB12, FOLATE, FERRITIN, TIBC, IRON, RETICCTPCT in the last 72 hours. Sepsis Labs: No results for input(s): PROCALCITON, LATICACIDVEN in the last 168 hours.  Recent Results (from the past 240 hour(s))  Urine culture     Status: None   Collection Time: 08/21/19  5:07 PM   Specimen: Urine, Clean Catch  Result Value Ref Range Status   Specimen Description   Final    URINE, CLEAN CATCH Performed at [facility], [address] [place], [place] [address]    Special Requests   Final    Normal Performed at [facility], [address] [place], [place] [address]    Culture   Final    NO GROWTH Performed at [facility], [place] [address], [place], [place] [address]    Report Status 08/23/2019 FINAL  Final  SARS CORONAVIRUS 2 (TAT 6-24 HRS) Nasopharyngeal Nasopharyngeal Swab     Status: None   Collection Time: 08/21/19  6:45 PM   Specimen: Nasopharyngeal Swab  Result Value Ref Range Status   SARS Coronavirus 2 NEGATIVE NEGATIVE Final    Comment: (NOTE) SARS-CoV-2 target nucleic acids are NOT DETECTED. The SARS-CoV-2 RNA is generally detectable in upper and lower respiratory specimens during the acute phase of infection. Negative results do not preclude SARS-CoV-2 infection, do not rule out co-infections with other pathogens, and should not be used as the sole basis for treatment or other patient management decisions. Negative results must be combined with clinical observations, patient history, and epidemiological information. The expected result is Negative. Fact Sheet for Patients: SugarRoll.be Fact Sheet for Healthcare Providers: https://www.woods-mathews.com/ This test is not yet approved or cleared by the  [place] FDA and  has been authorized for detection and/or diagnosis of SARS-CoV-2 by FDA under an Emergency Use Authorization (EUA). This EUA will remain  in effect (meaning this test can be used) for the duration of the COVID-19 declaration under Section 56 4(b)(1) of the Act, 21 U.S.C. section 360bbb-3(b)(1), unless the authorization is terminated or revoked sooner. Performed at [facility], [place] [address], [place], [place] [address]   MRSA PCR Screening     Status: None   Collection Time: 08/22/19 10:55 PM   Specimen: Nasopharyngeal  Result Value Ref Range Status   MRSA by PCR NEGATIVE NEGATIVE Final    Comment:        The GeneXpert MRSA Assay (FDA approved for NASAL specimens only), is one component of a comprehensive MRSA colonization surveillance program. It is not intended to diagnose MRSA infection nor to guide or monitor treatment for MRSA infections. Performed at [facility], [place], [place] [address]   SARS CORONAVIRUS 2 (TAT 6-24 HRS) Nasopharyngeal Nasopharyngeal Swab     Status: None   Collection Time: 08/25/19  7:00 PM   Specimen: Nasopharyngeal Swab  Result Value Ref  Range Status   SARS Coronavirus 2 NEGATIVE NEGATIVE Final    Comment: (NOTE) SARS-CoV-2 target nucleic acids are NOT DETECTED. The SARS-CoV-2 RNA is generally detectable in upper and lower respiratory specimens during the acute phase of infection. Negative results do not preclude SARS-CoV-2 infection, do not rule out co-infections with other pathogens, and should not be used as the sole basis for treatment or other patient management decisions. Negative results must be combined with clinical observations, patient history, and epidemiological information. The expected result is Negative. Fact Sheet for Patients: SugarRoll.be Fact Sheet for Healthcare Providers: https://www.woods-mathews.com/ This test is not yet  approved or cleared by the [place] FDA and  has been authorized for detection and/or diagnosis of SARS-CoV-2 by FDA under an Emergency Use Authorization (EUA). This EUA will remain  in effect (meaning this test can be used) for the duration of the COVID-19 declaration under Section 56 4(b)(1) of the Act, 21 U.S.C. section 360bbb-3(b)(1), unless the authorization is terminated or revoked sooner. Performed at [facility], [place] [address], [place],  [address]          Radiology Studies: No results found.      Scheduled Meds: . QUEtiapine  25 mg Oral BID  . sertraline  50 mg Oral Daily  . sodium chloride flush  3 mL Intravenous Q12H   Continuous Infusions: . sodium chloride 10 mL/hr at 08/28/19 0500    Assessment & Plan:   Principal Problem:   Acute metabolic encephalopathy Active Problems:   Essential hypertension   History of prostate cancer   Aneurysm of infrarenal abdominal aorta (HCC)   AMS (altered mental status)   Acute encephalopathy-unclear etiology. Suspect this is secondary to advanced dementia with waxing and waning periods of delirium. Patient afebrile and without leukocytosis. No signs of infection. Covid negative.  Ct head from 11/20 negative for acute abn. Urine culture negative, rules out UTI. Will D/CTelemetry Delirium precautions Continue gentle ivf. PT rec. SNF  Dementia, advanced -Continue home Aricept, Seroquel, Zoloft, melatonin -Hold as needed Ativan -Follow-up med list from SNF to confirm current meds --Palliative care following- see note -hospice consulted- see note- plan for home hospice, but not available until monday  Recurrent falls-likely secondary to overall deconditioning of end-stage dementia -PT OT -Fall precautions  Abdominal aortic aneurysm Previously 5 cm, now 6.2 per ultrasound obtained on admission after abdominal mass was palpated on exam. Vascular surgery was consulted and recommended  no intervention due to patient's advanced age and advanced dementia, has high risk of significant morbidity mortality.  Hypertension-chronic current med list from SNF pending. We will increase amlodipine to 5 mg daily Monitor BP  Stage I sacral decubitus ulcer-present on admission. Continue wound care.  Superficial lacerationsof the scalp, lower extremities. Due to recent falls.  Not bleeding and no signs of infection.  Wound care.   DVT prophylaxis:Lovenox Code Status: DNR Family Communication:None at bedside Disposition Plan:came from ALF.  Possible DC on Monday with home hospice versus SNF with hospice       LOS: 6 days   Time spent: 45 min with more than >50% on coc    Nolberto Hanlon, MD [facility] Pager [phone]  If 7PM-7AM, please contact night-coverage [email or web address] Password [facility] 08/28/2019, 12:38 PM Patient ID: [name], male   DOB: [birth year], 85 y.o.   MRN: [id number] Patient ID: [name], male   DOB: 01-11-1926, 85 y.o.   MRN: [id number]

## 2019-08-28 NOTE — Progress Notes (Signed)
PALLIATIVE NOTE:  Patient lying in bed. Eyes closed, will open on verbal stimuli. Confused with history of dementia. Bilateral mittens for safety. Patient appears somewhat agitated moving around in bed with occasional sounds or incomprehensible words. Continues to have poor po intake with few sips and bites intermittently. Will not follow commands. NT at the bedside finishing up with bathing. No family present. Updates and request given to RN regarding PRN medications.   Patient is full comfort care with pending hospice home acceptance and transfer. Will adjust all orders to reflect this.   I was able to speak with patient's daughter via phone. She verbalized and confirmed plans remain for hospice home transfer once bed is available. She is planning on coming up to be with patient this weekend from [facility].    Assessment -awake, confused, somewhat agitated, chronically-ill appearing -RRR -diminished bases  Plan -full comfort/EOL care -will d/c labs and other medications not comfort focused -PRNs for symptom management -PMT will continue to support and follow. No weekend coverage  Total Time: 25 min.   Greater than 50%  of this time was spent counseling and coordinating care related to the above assessment and plan.  Alda Lea, AGPCNP-BC Palliative Medicine Team

## 2019-08-28 NOTE — Care Management Important Message (Signed)
Important Message  Patient Details  Name: Carlos Hayden. MRN: [id number] Date of Birth: 29-Feb-1926   Medicare Important Message Given:  Yes     Dannette Barbara 08/28/2019, 2:01 PM

## 2019-08-29 NOTE — Progress Notes (Signed)
Patient transferred to 128. Report called and given by this RN.

## 2019-08-29 NOTE — Progress Notes (Signed)
In to see pt. Pt with IV pulled out. Pt is dnr/comfort measures, vs daily not giving medications but with fluids running at 10. Notified NP to ask if to leave iv out or to place another one. If leaving out, asked if could change agitation medications to im.   No new iv, medication changed to im. Will continue to monitor

## 2019-08-29 NOTE — Progress Notes (Addendum)
PROGRESS NOTE    Carlos Hayden.  [id number] DOB: Jul 24, 1926 DOA: 08/21/2019 PCP: No primary care provider on file.    Brief Narrative:  85 y.o.malewith medical history significant foradvanceddementia, hypertension, prostate cancer in remission, htn, AAA, and bradycardia, presentsafter being found at SNF unresponsive in a chair.Two prior ED visits for falls this month.Per report, patient has had no fevers or respiratory symptoms points of dysuria, no vomiting or diarrhea. No recent med changes. In the ED, patient afebrile bradycardic in 50s and mildly hypertensive. Labs are unremarkable. UA showed moderate LE, 11-20 WBCs, bacteria. Treated with Rocephin and urine culture pending.CT of head negative. Chest x-ray negative. Pulsatile abdominal mass noted on exam,therefore ultrasound was obtained and showed 6.2 cm abdominal aortic aneurysm up from 5 cm previously. Vascular surgery was consulted and recommended no intervention given patient's advanced age with dementia and high risk for morbidity mortality with any intervention. Patient was admitted initially for observation, telemetry and IV hydration.Holding further antibiotics pending urine culture.  Awaiting placement but patient's daughter and guardian has not been returning SW calls to assist with disposition.  Palliative consult as patient not eating or drinking this admission, goals of care discussion warranted.    Consultants:   Hospice and palliative care  Vascular surgery  Procedures: Aortic Doppler ultrasound 11/20  Antimicrobials:   None, 1 g Rocephin given in ED   Subjective: Patient laying in bed not answering my questions no acute distress Objective: Vitals:   08/28/19 0424 08/28/19 0734 08/28/19 1614 08/28/19 1934  BP: (!) 123/99 (!) 168/95 (!) 159/98 (!) 152/91  Pulse: 67 65 67 89  Resp: 20 18 18    Temp: 97.9 F (36.6 C) 97.9 F (36.6 C) 98.5 F (36.9 C) 97.8 F (36.6 C)   TempSrc: Oral   Oral  SpO2: 100% 98% 99% 100%  Weight: 81.6 kg     Height:        Intake/Output Summary (Last 24 hours) at 08/29/2019 0736 Last data filed at 08/29/2019 0011 Gross per 24 hour  Intake -  Output 0 ml  Net 0 ml   Filed Weights   08/22/19 0335 08/24/19 0541 08/28/19 0424  Weight: 72.1 kg 72.1 kg 81.6 kg    Examination:  General exam: laying in bed, nad. Eyes open Respiratory system: anteriorly cta, with poor resp effort. No wheezing Cardiovascular system: S1 & S2  RRR.  No Murmur Gastrointestinal system: soft, nt/nd +bs Central nervous system: confused  Extremities: no edema    Skin: warm, dry Psychiatry:unable to assess , not cooperative with exam    Data Reviewed: I have personally reviewed following labs and imaging studies  CBC: Recent Labs  Lab 08/23/19 0531  WBC 7.3  NEUTROABS 5.5  HGB 13.1  HCT 36.9*  MCV 90.7  PLT [id number]   Basic Metabolic Panel: Recent Labs  Lab 08/26/19 0649  CREATININE 0.56*   GFR: Estimated Creatinine Clearance: 60.1 mL/min (A) (by C-G formula based on SCr of 0.56 mg/dL (L)). Liver Function Tests: No results for input(s): AST, ALT, ALKPHOS, BILITOT, PROT, ALBUMIN in the last 168 hours. No results for input(s): LIPASE, AMYLASE in the last 168 hours. No results for input(s): AMMONIA in the last 168 hours. Coagulation Profile: No results for input(s): INR, PROTIME in the last 168 hours. Cardiac Enzymes: No results for input(s): CKTOTAL, CKMB, CKMBINDEX, TROPONINI in the last 168 hours. BNP (last 3 results) No results for input(s): PROBNP in the last 8760 hours. HbA1C: No results for input(s): HGBA1C  in the last 72 hours. CBG: No results for input(s): GLUCAP in the last 168 hours. Lipid Profile: No results for input(s): CHOL, HDL, LDLCALC, TRIG, CHOLHDL, LDLDIRECT in the last 72 hours. Thyroid Function Tests: No results for input(s): TSH, T4TOTAL, FREET4, T3FREE, THYROIDAB in the last 72 hours. Anemia Panel:  No results for input(s): VITAMINB12, FOLATE, FERRITIN, TIBC, IRON, RETICCTPCT in the last 72 hours. Sepsis Labs: No results for input(s): PROCALCITON, LATICACIDVEN in the last 168 hours.  Recent Results (from the past 240 hour(s))  Urine culture     Status: None   Collection Time: 08/21/19  5:07 PM   Specimen: Urine, Clean Catch  Result Value Ref Range Status   Specimen Description   Final    URINE, CLEAN CATCH Performed at [facility], [address], [place], [place] [address]    Special Requests   Final    Normal Performed at [facility], [address], [place], [place] [address]    Culture   Final    NO GROWTH Performed at [facility], [place] [address] [place], [place] [address]    Report Status 08/23/2019 FINAL  Final  SARS CORONAVIRUS 2 (TAT 6-24 HRS) Nasopharyngeal Nasopharyngeal Swab     Status: None   Collection Time: 08/21/19  6:45 PM   Specimen: Nasopharyngeal Swab  Result Value Ref Range Status   SARS Coronavirus 2 NEGATIVE NEGATIVE Final    Comment: (NOTE) SARS-CoV-2 target nucleic acids are NOT DETECTED. The SARS-CoV-2 RNA is generally detectable in upper and lower respiratory specimens during the acute phase of infection. Negative results do not preclude SARS-CoV-2 infection, do not rule out co-infections with other pathogens, and should not be used as the sole basis for treatment or other patient management decisions. Negative results must be combined with clinical observations, patient history, and epidemiological information. The expected result is Negative. Fact Sheet for Patients: SugarRoll.be Fact Sheet for Healthcare Providers: https://www.woods-mathews.com/ This test is not yet approved or cleared by the [place] FDA and  has been authorized for detection and/or diagnosis of SARS-CoV-2 by FDA under an Emergency Use Authorization (EUA). This EUA will remain  in effect (meaning  this test can be used) for the duration of the COVID-19 declaration under Section 56 4(b)(1) of the Act, 21 U.S.C. section 360bbb-3(b)(1), unless the authorization is terminated or revoked sooner. Performed at [facility], [place] [address], [place], [place] [address]   MRSA PCR Screening     Status: None   Collection Time: 08/22/19 10:55 PM   Specimen: Nasopharyngeal  Result Value Ref Range Status   MRSA by PCR NEGATIVE NEGATIVE Final    Comment:        The GeneXpert MRSA Assay (FDA approved for NASAL specimens only), is one component of a comprehensive MRSA colonization surveillance program. It is not intended to diagnose MRSA infection nor to guide or monitor treatment for MRSA infections. Performed at [facility], [place], [place] [address]   SARS CORONAVIRUS 2 (TAT 6-24 HRS) Nasopharyngeal Nasopharyngeal Swab     Status: None   Collection Time: 08/25/19  7:00 PM   Specimen: Nasopharyngeal Swab  Result Value Ref Range Status   SARS Coronavirus 2 NEGATIVE NEGATIVE Final    Comment: (NOTE) SARS-CoV-2 target nucleic acids are NOT DETECTED. The SARS-CoV-2 RNA is generally detectable in upper and lower respiratory specimens during the acute phase of infection. Negative results do not preclude SARS-CoV-2 infection, do not rule out  co-infections with other pathogens, and should not be used as the sole basis for treatment or other patient management decisions. Negative results must be combined with clinical observations, patient history, and epidemiological information. The expected result is Negative. Fact Sheet for Patients: SugarRoll.be Fact Sheet for Healthcare Providers: https://www.woods-mathews.com/ This test is not yet approved or cleared by the [place] FDA and  has been authorized for detection and/or diagnosis of SARS-CoV-2 by FDA under an Emergency Use Authorization (EUA). This EUA will  remain  in effect (meaning this test can be used) for the duration of the COVID-19 declaration under Section 56 4(b)(1) of the Act, 21 U.S.C. section 360bbb-3(b)(1), unless the authorization is terminated or revoked sooner. Performed at [facility], [place] [address], [place], [place] [address]          Radiology Studies: No results found.      Scheduled Meds: . sodium chloride flush  3 mL Intravenous Q12H   Continuous Infusions: . sodium chloride 10 mL/hr at 08/28/19 0500    Assessment & Plan:   Principal Problem:   Acute metabolic encephalopathy Active Problems:   Essential hypertension   History of prostate cancer   Aneurysm of infrarenal abdominal aorta (HCC)   AMS (altered mental status)   Acute encephalopathy-unclear etiology. Suspect this is secondary to advanced dementia with waxing and waning periods of delirium. Patient afebrile and without leukocytosis. No signs of infection. Covid negative.  Ct head from 11/20 negative for acute abn. Urine culture negative, rules out UTI. Will D/CTelemetry Delirium precautions Continue gentle ivf. PT rec. SNF  Dementia, advanced -Continue home Aricept, Seroquel, Zoloft, melatonin -Hold as needed Ativan -Follow-up med list from SNF to confirm current meds --Palliative care following- see note -hospice consulted- see note- plan for home hospice, but not available until Monday Currently on comfort measures  Recurrent falls-likely secondary to overall deconditioning of end-stage dementia -Fall precautions  Abdominal aortic aneurysm Previously 5 cm, now 6.2 per ultrasound obtained on admission after abdominal mass was palpated on exam. Vascular surgery was consulted and recommended no intervention due to patient's advanced age and advanced dementia, has high risk of significant morbidity mortality.  Hypertension-chronic Now on comfort measures  Stage I sacral decubitus ulcer-present on  admission. Continue wound care/comfort measures  Superficial lacerationsof the scalp, lower extremities. Due to recent falls.  Not bleeding and no signs of infection.  Wound care.   DVT prophylaxis:Lovenox Code Status: DNR Family Communication:None at bedside Disposition Plan:came from ALF, possible hospice home on Monday we will continue comfort measures      LOS: 7 days   Time spent: 35 min with more than >50% on coc    Nolberto Hanlon, MD [facility] Pager [phone]  If 7PM-7AM, please contact night-coverage [email or web address] Password [facility] 08/29/2019, 7:36 AM Patient ID: [name], male   DOB: [birth year], 85 y.o.   MRN: [id number] Patient ID: [name], male   DOB: 26-Aug-1926, 85 y.o.   MRN: [id number] Patient ID: [name], male   DOB: 10/23/1925, 85 y.o.   MRN: [id number]

## 2019-08-29 NOTE — Plan of Care (Signed)
  Problem: Activity: Goal: Risk for activity intolerance will decrease Outcome: Not Progressing   Problem: Nutrition: Goal: Adequate nutrition will be maintained Outcome: Not Progressing   Problem: Elimination: Goal: Will not experience complications related to bowel motility Outcome: Not Progressing   Problem: Safety: Goal: Ability to remain free from injury will improve Outcome: Progressing  Low bed

## 2019-08-30 ENCOUNTER — Encounter: Payer: Self-pay | Admitting: Internal Medicine

## 2019-08-30 NOTE — Progress Notes (Signed)
Patient ID: Carlos Clark., male   DOB: 1925/11/22, 85 y.o.   MRN: [id number]  PROGRESS NOTE    Myrtice Lauth [facility].  [id number] DOB: [birth year] DOA: 08/21/2019 PCP: No primary care provider on file.    Brief Narrative:  85 y.o.malewith medical history significant foradvanceddementia, hypertension, prostate cancer in remission, htn, AAA, and bradycardia, presentsafter being found at SNF unresponsive in a chair.Two prior ED visits for falls this month.Per report, patient has had no fevers or respiratory symptoms points of dysuria, no vomiting or diarrhea. No recent med changes. In the ED, patient afebrile bradycardic in 50s and mildly hypertensive. Labs are unremarkable. UA showed moderate LE, 11-20 WBCs, bacteria. Treated with Rocephin and urine culture pending.CT of head negative. Chest x-ray negative. Pulsatile abdominal mass noted on exam,therefore ultrasound was obtained and showed 6.2 cm abdominal aortic aneurysm up from 5 cm previously. Vascular surgery was consulted and recommended no intervention given patient's advanced age with dementia and high risk for morbidity mortality with any intervention. Patient was admitted initially for observation, telemetry and IV hydration.Holding further antibiotics pending urine culture.  Awaiting placement but patient's daughter and guardian has not been returning SW calls to assist with disposition.  Palliative consult as patient not eating or drinking this admission, goals of care discussion warranted.    Consultants:   Hospice and palliative care  Vascular surgery  Procedures: Aortic Doppler ultrasound 11/20  Antimicrobials:   None, 1 g Rocephin given in ED   Subjective: Patient laying in bed no acute distress, somnolent Objective: Vitals:   08/28/19 0734 08/28/19 1614 08/28/19 1934 08/29/19 2351  BP: (!) 168/95 (!) 159/98 (!) 152/91 (!) 152/134  Pulse: 65 67 89 88  Resp: 18 18  18    Temp: 97.9 F (36.6 C) 98.5 F (36.9 C) 97.8 F (36.6 C)   TempSrc:   Oral   SpO2: 98% 99% 100% (!) 78%  Weight:      Height:       No intake or output data in the 24 hours ending 08/30/19 1131 Filed Weights   08/22/19 0335 08/24/19 0541 08/28/19 0424  Weight: 72.1 kg 72.1 kg 81.6 kg    Examination:  General exam: laying in bed, nad.  Somnolent Respiratory system: anteriorly cta, with poor resp effort.  No wheeze rales rhonchi Cardiovascular system: Regular S1 & S2  RRR.  No Murmur Gastrointestinal system: soft, nt/nd +bs Central nervous system: confused  Extremities: no edema    Skin: warm, dry Psychiatry:unable to assess , not cooperative with exam    Data Reviewed: I have personally reviewed following labs and imaging studies  CBC: No results for input(s): WBC, NEUTROABS, HGB, HCT, MCV, PLT in the last 168 hours. Basic Metabolic Panel: Recent Labs  Lab 08/26/19 0649  CREATININE 0.56*   GFR: Estimated Creatinine Clearance: 60.1 mL/min (A) (by C-G formula based on SCr of 0.56 mg/dL (L)). Liver Function Tests: No results for input(s): AST, ALT, ALKPHOS, BILITOT, PROT, ALBUMIN in the last 168 hours. No results for input(s): LIPASE, AMYLASE in the last 168 hours. No results for input(s): AMMONIA in the last 168 hours. Coagulation Profile: No results for input(s): INR, PROTIME in the last 168 hours. Cardiac Enzymes: No results for input(s): CKTOTAL, CKMB, CKMBINDEX, TROPONINI in the last 168 hours. BNP (last 3 results) No results for input(s): PROBNP in the last 8760 hours. HbA1C: No results for input(s): HGBA1C in the last 72 hours. CBG: No results for input(s): GLUCAP in the last 168  hours. Lipid Profile: No results for input(s): CHOL, HDL, LDLCALC, TRIG, CHOLHDL, LDLDIRECT in the last 72 hours. Thyroid Function Tests: No results for input(s): TSH, T4TOTAL, FREET4, T3FREE, THYROIDAB in the last 72 hours. Anemia Panel: No results for input(s): VITAMINB12,  FOLATE, FERRITIN, TIBC, IRON, RETICCTPCT in the last 72 hours. Sepsis Labs: No results for input(s): PROCALCITON, LATICACIDVEN in the last 168 hours.  Recent Results (from the past 240 hour(s))  Urine culture     Status: None   Collection Time: 08/21/19  5:07 PM   Specimen: Urine, Clean Catch  Result Value Ref Range Status   Specimen Description   Final    URINE, CLEAN CATCH Performed at [facility], [address], [place], [place] [address]    Special Requests   Final    Normal Performed at [facility], [address], [place], [place] [address]    Culture   Final    NO GROWTH Performed at [facility], [place] [address], [place], [place] [address]    Report Status 08/23/2019 FINAL  Final  SARS CORONAVIRUS 2 (TAT 6-24 HRS) Nasopharyngeal Nasopharyngeal Swab     Status: None   Collection Time: 08/21/19  6:45 PM   Specimen: Nasopharyngeal Swab  Result Value Ref Range Status   SARS Coronavirus 2 NEGATIVE NEGATIVE Final    Comment: (NOTE) SARS-CoV-2 target nucleic acids are NOT DETECTED. The SARS-CoV-2 RNA is generally detectable in upper and lower respiratory specimens during the acute phase of infection. Negative results do not preclude SARS-CoV-2 infection, do not rule out co-infections with other pathogens, and should not be used as the sole basis for treatment or other patient management decisions. Negative results must be combined with clinical observations, patient history, and epidemiological information. The expected result is Negative. Fact Sheet for Patients: SugarRoll.be Fact Sheet for Healthcare Providers: https://www.woods-mathews.com/ This test is not yet approved or cleared by the [place] FDA and  has been authorized for detection and/or diagnosis of SARS-CoV-2 by FDA under an Emergency Use Authorization (EUA). This EUA will remain  in effect (meaning this test can be used) for the  duration of the COVID-19 declaration under Section 56 4(b)(1) of the Act, 21 U.S.C. section 360bbb-3(b)(1), unless the authorization is terminated or revoked sooner. Performed at [facility], [place] [address], [place], [place] [address]   MRSA PCR Screening     Status: None   Collection Time: 08/22/19 10:55 PM   Specimen: Nasopharyngeal  Result Value Ref Range Status   MRSA by PCR NEGATIVE NEGATIVE Final    Comment:        The GeneXpert MRSA Assay (FDA approved for NASAL specimens only), is one component of a comprehensive MRSA colonization surveillance program. It is not intended to diagnose MRSA infection nor to guide or monitor treatment for MRSA infections. Performed at [facility], [place], [place] [address]   SARS CORONAVIRUS 2 (TAT 6-24 HRS) Nasopharyngeal Nasopharyngeal Swab     Status: None   Collection Time: 08/25/19  7:00 PM   Specimen: Nasopharyngeal Swab  Result Value Ref Range Status   SARS Coronavirus 2 NEGATIVE NEGATIVE Final    Comment: (NOTE) SARS-CoV-2 target nucleic acids are NOT DETECTED. The SARS-CoV-2 RNA is generally detectable in upper and lower respiratory specimens during the acute phase of infection. Negative results do not preclude SARS-CoV-2 infection, do not rule out co-infections with other pathogens, and should not be used as the sole basis for treatment  or other patient management decisions. Negative results must be combined with clinical observations, patient history, and epidemiological information. The expected result is Negative. Fact Sheet for Patients: SugarRoll.be Fact Sheet for Healthcare Providers: https://www.woods-mathews.com/ This test is not yet approved or cleared by the [place] FDA and  has been authorized for detection and/or diagnosis of SARS-CoV-2 by FDA under an Emergency Use Authorization (EUA). This EUA will remain  in effect (meaning this  test can be used) for the duration of the COVID-19 declaration under Section 56 4(b)(1) of the Act, 21 U.S.C. section 360bbb-3(b)(1), unless the authorization is terminated or revoked sooner. Performed at [facility], [place] [address] [place], [place] [address]          Radiology Studies: No results found.      Scheduled Meds: . sodium chloride flush  3 mL Intravenous Q12H   Continuous Infusions: . sodium chloride 10 mL/hr at 08/28/19 0500    Assessment & Plan:   Principal Problem:   Acute metabolic encephalopathy Active Problems:   Essential hypertension   History of prostate cancer   Aneurysm of infrarenal abdominal aorta (HCC)   AMS (altered mental status)   Acute encephalopathy-unclear etiology. Suspect this is secondary to advanced dementia with waxing and waning periods of delirium. Patient afebrile and without leukocytosis. No signs of infection. Covid negative.  Ct head from 11/20 negative for acute abn. Urine culture negative, rules out UTI. Will D/CTelemetry Delirium precautions Continue gentle ivf. PT rec. SNF  Dementia, advanced -Continue home Aricept, Seroquel, Zoloft, melatonin -Hold as needed Ativan -Follow-up med list from SNF to confirm current meds --Palliative care following- see note -hospice consulted- see note- plan for home hospice, but not available until Monday Currently on comfort measures  Recurrent falls-likely secondary to overall deconditioning of end-stage dementia -Fall precautions  Abdominal aortic aneurysm Previously 5 cm, now 6.2 per ultrasound obtained on admission after abdominal mass was palpated on exam. Vascular surgery was consulted and recommended no intervention due to patient's advanced age and advanced dementia, has high risk of significant morbidity mortality.  Hypertension-chronic Now on comfort measures  Stage I sacral decubitus ulcer-present on admission. Continue wound  care/comfort measures  Superficial lacerationsof the scalp, lower extremities. Due to recent falls.  Not bleeding and no signs of infection.  Wound care.   DVT prophylaxis:comfort measures Code Status: DNR Family Communication:None at bedside Disposition Plan:came from ALF, possible hospice home on Monday we will continue comfort measures      LOS: 8 days   Time spent: 35 min with more than >50% on coc    Nolberto Hanlon, MD [facility] Pager [phone]  If 7PM-7AM, please contact night-coverage [email or web address] Password [facility] 08/30/2019, 11:31 AM Patient ID: Carlos Clark., male   DOB: Mar 01, 1926, 85 y.o.   MRN: [id number] Patient ID: Carlos Clark., male   DOB: 1926/09/02, 85 y.o.   MRN: [id number] Patient ID: Carlos Clark., male   DOB: 1926-05-31, 85 y.o.   MRN: [id number]

## 2019-08-30 NOTE — TOC Progression Note (Signed)
Transition of Care (TOC) - Progression Note    Patient Details  Name: Carlos Hayden. MRN: [id number] Date of Birth: Sep 07, 1926  Transition of Care [facility]) CM/SW Contact  Cecil Cobbs Phone Number: 08/30/2019, 6:19 PM  Clinical Narrative:    Patient still waiting for bed at hospice facility.   Expected Discharge Plan: [place] Barriers to Discharge: Hospice Bed not available  Expected Discharge Plan and Services Expected Discharge Plan: [place] Choice: Hospice Living arrangements for the past 2 months: Assisted Living Facility                                       Social Determinants of Health (SDOH) Interventions    Readmission Risk Interventions No flowsheet data found.

## 2019-08-31 MED ORDER — HALOPERIDOL LACTATE 2 MG/ML PO CONC
1.0000 mg | Freq: Four times a day (QID) | ORAL | Status: DC | PRN
  Administered 2019-08-31: 16:00:00 1 mg via ORAL
  Filled 2019-08-31 (×2): qty 0.5

## 2019-08-31 MED ORDER — MORPHINE SULFATE (CONCENTRATE) 10 MG/0.5ML PO SOLN: 5.0000 mg | ORAL | 0 refills | Status: AC | PRN

## 2019-08-31 MED ORDER — HALOPERIDOL LACTATE 2 MG/ML PO CONC: 1.0000 mg | Freq: Four times a day (QID) | ORAL | 0 refills | Status: AC | PRN

## 2019-08-31 MED ORDER — LORAZEPAM 2 MG/ML PO CONC: 1.0000 mg | ORAL | 0 refills | Status: AC | PRN

## 2019-08-31 MED ORDER — ACETAMINOPHEN 650 MG RE SUPP: 650.0000 mg | Freq: Four times a day (QID) | RECTAL | 0 refills | Status: AC | PRN

## 2019-08-31 MED ORDER — SENNOSIDES-DOCUSATE SODIUM 8.6-50 MG PO TABS: 2.0000 | ORAL_TABLET | Freq: Every day | ORAL | 0 refills | Status: AC | PRN

## 2019-08-31 MED ORDER — POLYVINYL ALCOHOL 1.4 % OP SOLN: 1.0000 [drp] | Freq: Four times a day (QID) | OPHTHALMIC | 0 refills | Status: AC | PRN

## 2019-08-31 MED ORDER — LORAZEPAM 2 MG/ML PO CONC
1.0000 mg | ORAL | Status: DC | PRN
  Administered 2019-08-31 (×2): 1 mg via ORAL
  Filled 2019-08-31 (×4): qty 0.5

## 2019-08-31 MED ORDER — BIOTENE DRY MOUTH MT LIQD: 15.0000 mL | OROMUCOSAL | 0 refills | Status: AC | PRN

## 2019-08-31 NOTE — TOC Transition Note (Signed)
Transition of Care [facility]) - CM/SW Discharge Note   Patient Details  Name: Carlos Hayden. MRN: [id number] Date of Birth: Aug 31, 1926  Transition of Care [facility]) CM/SW Contact:  Shelbie Hutching, RN Phone Number: 08/31/2019, 2:15 PM   Clinical Narrative:    Patient will transfer to residential hospice at [facility] in [place].  Patient will transport via [employer].     Final next level of care: [place] Barriers to Discharge: Barriers Resolved   Patient Goals and CMS Choice Patient states their goals for this hospitalization and ongoing recovery are:: Patient not fully oriented.      Discharge Placement                Patient to be transferred to facility by: [facility] Name of family member notified: Augusto Garbe- daughter Patient and family notified of of transfer: 08/31/19  Discharge Plan and Services     Post Acute Care Choice: Hospice                               Social Determinants of Health (SDOH) Interventions     Readmission Risk Interventions No flowsheet data found.

## 2019-08-31 NOTE — Progress Notes (Signed)
 Vein & Vascular Surgery  Communication Note: Patient has been transitioned to hospice status. Vascular Surgery to sign off at this time.  Discussed with Dr. Eber Hong Deyanira Fesler PA-C 08/31/2019 9:41 AM

## 2019-08-31 NOTE — Discharge Summary (Signed)
Carlos Hayden [employer] [id number] DOB: 1925/11/11 DOA: 08/21/2019  PCP: No primary care provider on file.  Admit date: 08/21/2019 Discharge date: 08/31/2019  Admitted From: SNF Disposition: Hospice house  Recommendations for Outpatient Follow-up:  none  Home Health:none    Discharge Condition:Stable CODE STATUS:DNR  Diet recommendation: Heart Healthy /think fluid Brief/Interim Summary: 85 y.o.malewith medical history significant foradvanceddementia, hypertension, prostate cancer in remission, htn, AAA, and bradycardia, presentsafter being found at SNF unresponsive in a chair.Two prior ED visits for falls this month.Per report, patient has had no fevers or respiratory symptoms points of dysuria, no vomiting or diarrhea. No recent med changes. In the ED, patient afebrile bradycardic in 50s and mildly hypertensive. Labs are unremarkable. UA showed moderate LE, 11-20 WBCs, bacteria. Treated with Rocephin and urine culture pending.CT of head negative. Chest x-ray negative. Pulsatile abdominal mass noted on exam,therefore ultrasound was obtained and showed 6.2 cm abdominal aortic aneurysm up from 5 cm previously. Vascular surgery was consulted and recommended no intervention given patient's advanced age with dementia and high risk for morbidity mortality with any intervention. And did have acute encephalopathy suspected this was due to advanced dementia with waxing and waning periods of delirium.  She remained afebrile without leukocytosis.  Covid was negative.  CT of the head from 1120 was negative for acute abnormality.  Negative urine culture.  There was discussion via case management and palliative care with patient's daughter and they have decided to place patient on hospice house and to switch to comfort care has been transferred to hospice house.    Discharge Diagnoses:  Principal Problem:   Acute metabolic encephalopathy Active Problems:   Essential  hypertension   History of prostate cancer   Aneurysm of infrarenal abdominal aorta (HCC)   AMS (altered mental status)    Discharge Instructions  Discharge Instructions    Diet - low sodium heart healthy   Complete by: As directed    Increase activity slowly   Complete by: As directed      Allergies as of 08/31/2019   No Known Allergies     Medication List    STOP taking these medications   cyanocobalamin 1000 MCG tablet   donepezil 5 MG tablet Commonly known as: ARICEPT   LORazepam 0.5 MG tablet Commonly known as: ATIVAN Replaced by: LORazepam 2 MG/ML concentrated solution   Melatonin 3 MG Tabs   QUEtiapine 25 MG tablet Commonly known as: SEROQUEL   sertraline 50 MG tablet Commonly known as: ZOLOFT     TAKE these medications   acetaminophen 650 MG suppository Commonly known as: TYLENOL Place 1 suppository (650 mg total) rectally every 6 (six) hours as needed for mild pain (or Fever >/= 101).   antiseptic oral rinse Liqd Apply 15 mLs topically as needed for dry mouth.   haloperidol 2 MG/ML solution Commonly known as: HALDOL Take 0.5 mLs (1 mg total) by mouth every 6 (six) hours as needed for agitation.   LORazepam 2 MG/ML concentrated solution Commonly known as: ATIVAN Take 0.5 mLs (1 mg total) by mouth every 4 (four) hours as needed for anxiety (comfort measures). Replaces: LORazepam 0.5 MG tablet   morphine CONCENTRATE 10 MG/0.5ML Soln concentrated solution Place 0.25 mLs (5 mg total) under the tongue every 2 (two) hours as needed for moderate pain (or dyspnea).   polyvinyl alcohol 1.4 % ophthalmic solution Commonly known as: LIQUIFILM TEARS Place 1 drop into both eyes 4 (four) times daily as needed for dry eyes.   senna-docusate 8.6-50  MG tablet Commonly known as: Senokot S Take 2 tablets by mouth daily as needed for mild constipation or moderate constipation. What changed:   when to take this  reasons to take this       No Known  Allergies  Consultations:  Hospice and palliative care  Vascular surgery   Procedures/Studies: Dg Chest 2 View  Result Date: 08/21/2019 CLINICAL DATA:  Weakness EXAM: CHEST - 2 VIEW COMPARISON:  10/08/2017 FINDINGS: COPD with hyperinflation and scarring. No acute infiltrate or effusion. Negative for heart failure. Atherosclerotic calcification aortic arch. IMPRESSION: COPD without acute cardiopulmonary abnormality. Electronically Signed   By: Franchot Gallo M.D.   On: 08/21/2019 17:50   Ct Head Wo Contrast  Result Date: 08/21/2019 CLINICAL DATA:  Altered level of consciousness (LOC), unexplained. EXAM: CT HEAD WITHOUT CONTRAST TECHNIQUE: Contiguous axial images were obtained from the base of the skull through the vertex without intravenous contrast. COMPARISON:  Head CT 08/15/2011 FINDINGS: Brain: No evidence of acute intracranial hemorrhage. No demarcated cortical infarction. No evidence of intracranial mass. No midline shift or extra-axial fluid collection. Stable generalized parenchymal atrophy and chronic small vessel ischemic disease. Redemonstrated prominent perivascular space versus small chronic lacunar infarct within the inferior left basal ganglia Vascular: No hyperdense vessel.  Atherosclerotic calcifications. Skull: Normal. Negative for fracture or focal lesion. Sinuses/Orbits: Visualized orbits demonstrate no acute abnormality. No significant paranasal sinus disease or mastoid effusion at the imaged levels. IMPRESSION: 1. No evidence of acute intracranial abnormality. 2. Stable generalized parenchymal atrophy and chronic small vessel ischemic disease. Electronically Signed   By: Kellie Simmering DO   On: 08/21/2019 17:53   Ct Head Wo Contrast  Result Date: 08/15/2019 CLINICAL DATA:  Question of head and C-spine trauma. EXAM: CT HEAD WITHOUT CONTRAST CT CERVICAL SPINE WITHOUT CONTRAST TECHNIQUE: Multidetector CT imaging of the head and cervical spine was performed following the  standard protocol without intravenous contrast. Multiplanar CT image reconstructions of the cervical spine were also generated. COMPARISON:  04/22/2019 FINDINGS: CT HEAD FINDINGS Brain: No evidence of acute infarction, hemorrhage, hydrocephalus, extra-axial collection or mass lesion/mass effect. Stable appearance of generalized atrophy and chronic microvascular ischemic changes. Vascular: No hyperdense vessel or unexpected calcification. Skull: Normal. Negative for fracture or focal lesion. Sinuses/Orbits: No acute finding. Other: None. CT CERVICAL SPINE FINDINGS Alignment: Straightening of normal cervical lordosis likely positional. Skull base and vertebrae: No acute fracture. No primary bone lesion or focal pathologic process. Soft tissues and spinal canal: No prevertebral fluid or swelling. No visible canal hematoma. Disc levels: Multilevel degenerative change unchanged from previous exam worse in the mid and lower cervical spine. Upper chest: Biapical pleuroparenchymal scarring similar to prior study. Other: None IMPRESSION: 1. Signs of atrophy and chronic microvascular ischemic change without acute intracranial abnormality. 2. No fracture or traumatic malalignment in the cervical spine. 3. Extensive spinal degenerative changes as before. Electronically Signed   By: Zetta Bills M.D.   On: 08/15/2019 20:27   Ct Cervical Spine Wo Contrast  Result Date: 08/15/2019 CLINICAL DATA:  Question of head and C-spine trauma. EXAM: CT HEAD WITHOUT CONTRAST CT CERVICAL SPINE WITHOUT CONTRAST TECHNIQUE: Multidetector CT imaging of the head and cervical spine was performed following the standard protocol without intravenous contrast. Multiplanar CT image reconstructions of the cervical spine were also generated. COMPARISON:  04/22/2019 FINDINGS: CT HEAD FINDINGS Brain: No evidence of acute infarction, hemorrhage, hydrocephalus, extra-axial collection or mass lesion/mass effect. Stable appearance of generalized atrophy  and chronic microvascular ischemic changes. Vascular: No hyperdense  vessel or unexpected calcification. Skull: Normal. Negative for fracture or focal lesion. Sinuses/Orbits: No acute finding. Other: None. CT CERVICAL SPINE FINDINGS Alignment: Straightening of normal cervical lordosis likely positional. Skull base and vertebrae: No acute fracture. No primary bone lesion or focal pathologic process. Soft tissues and spinal canal: No prevertebral fluid or swelling. No visible canal hematoma. Disc levels: Multilevel degenerative change unchanged from previous exam worse in the mid and lower cervical spine. Upper chest: Biapical pleuroparenchymal scarring similar to prior study. Other: None IMPRESSION: 1. Signs of atrophy and chronic microvascular ischemic change without acute intracranial abnormality. 2. No fracture or traumatic malalignment in the cervical spine. 3. Extensive spinal degenerative changes as before. Electronically Signed   By: Zetta Bills M.D.   On: 08/15/2019 20:27   US Aorta Duplex Limited  Result Date: 08/21/2019 CLINICAL DATA:  Pulsatile abdominal mass. EXAM: US ABDOMINAL AORTA MEDICARE SCREENING TECHNIQUE: Ultrasound examination of the abdominal aorta was performed as a screening evaluation for abdominal aortic aneurysm. COMPARISON:  CT abdomen pelvis, 06/26/2017. FINDINGS: Abdominal aortic measurements as follows: (AP x transverse ) Proximal:  2.9 x 2.7 cm Mid:  2.7 x 2.7 cm Distal:  6.2 x 6.1 cm IMPRESSION: 1. 6.2 cm infrarenal abdominal aortic aneurysm, increased from 5 cm from the prior abdomen and pelvis CT. No sonographic evidence of rupture. Mural thrombus noted along the anterior aspect of the aneurysm. Vascular surgery consultation recommended due to increased risk of rupture for AAA >5.5 cm. This recommendation follows ACR consensus guidelines: White Paper of the ACR Incidental Findings Committee II on Vascular Findings. J Am Coll Radiol 2013; 10:789-794. Aortic aneurysm NOS  (ICD10-I71.9) Electronically Signed   By: Lajean Manes M.D.   On: 08/21/2019 17:57      Subjective: Patient lying in bed no acute distress not cooperative with exam  Discharge Exam: Vitals:   08/29/19 2351 08/30/19 2340  BP: (!) 152/134 (!) 136/102  Pulse: 88 69  Resp: 18 18  Temp:  (!) 97.4 F (36.3 C)  SpO2: (!) 78% (!) 85%   Vitals:   08/28/19 1614 08/28/19 1934 08/29/19 2351 08/30/19 2340  BP: (!) 159/98 (!) 152/91 (!) 152/134 (!) 136/102  Pulse: 67 89 88 69  Resp: 18  18 18   Temp: 98.5 F (36.9 C) 97.8 F (36.6 C)  (!) 97.4 F (36.3 C)  TempSrc:  Oral  Oral  SpO2: 99% 100% (!) 78% (!) 85%  Weight:      Height:        General: Laying in bed, somnolent not cooperative with exam Cardiovascular: RRR, S1/S2 +, no rubs, no gallops Respiratory: CTA bilaterally anteriorly, no wheezing, no rhonchi Abdominal: Soft, NT, ND, bowel sounds + Extremities: no edema, no cyanosis    The results of significant diagnostics from this hospitalization (including imaging, microbiology, ancillary and laboratory) are listed below for reference.     Microbiology: Recent Results (from the past 240 hour(s))  Urine culture     Status: None   Collection Time: 08/21/19  5:07 PM   Specimen: Urine, Clean Catch  Result Value Ref Range Status   Specimen Description   Final    URINE, CLEAN CATCH Performed at [facility], [address], [place], [place] [address]    Special Requests   Final    Normal Performed at [facility], [address], [place], [place] [address]    Culture   Final    NO GROWTH Performed at [facility], [place]  [address], [place], [place] [address]    Report Status 08/23/2019 FINAL  Final  SARS CORONAVIRUS 2 (TAT 6-24 HRS) Nasopharyngeal Nasopharyngeal Swab     Status: None   Collection Time: 08/21/19  6:45 PM   Specimen: Nasopharyngeal Swab  Result Value Ref Range Status   SARS Coronavirus 2 NEGATIVE NEGATIVE Final    Comment:  (NOTE) SARS-CoV-2 target nucleic acids are NOT DETECTED. The SARS-CoV-2 RNA is generally detectable in upper and lower respiratory specimens during the acute phase of infection. Negative results do not preclude SARS-CoV-2 infection, do not rule out co-infections with other pathogens, and should not be used as the sole basis for treatment or other patient management decisions. Negative results must be combined with clinical observations, patient history, and epidemiological information. The expected result is Negative. Fact Sheet for Patients: SugarRoll.be Fact Sheet for Healthcare Providers: https://www.woods-mathews.com/ This test is not yet approved or cleared by the [place] FDA and  has been authorized for detection and/or diagnosis of SARS-CoV-2 by FDA under an Emergency Use Authorization (EUA). This EUA will remain  in effect (meaning this test can be used) for the duration of the COVID-19 declaration under Section 56 4(b)(1) of the Act, 21 U.S.C. section 360bbb-3(b)(1), unless the authorization is terminated or revoked sooner. Performed at [facility], [place] [address] [place], [place] [address]   MRSA PCR Screening     Status: None   Collection Time: 08/22/19 10:55 PM   Specimen: Nasopharyngeal  Result Value Ref Range Status   MRSA by PCR NEGATIVE NEGATIVE Final    Comment:        The GeneXpert MRSA Assay (FDA approved for NASAL specimens only), is one component of a comprehensive MRSA colonization surveillance program. It is not intended to diagnose MRSA infection nor to guide or monitor treatment for MRSA infections. Performed at [facility], [place], [place] [address]   SARS CORONAVIRUS 2 (TAT 6-24 HRS) Nasopharyngeal Nasopharyngeal Swab     Status: None   Collection Time: 08/25/19  7:00 PM   Specimen: Nasopharyngeal Swab  Result Value Ref Range Status   SARS Coronavirus 2 NEGATIVE  NEGATIVE Final    Comment: (NOTE) SARS-CoV-2 target nucleic acids are NOT DETECTED. The SARS-CoV-2 RNA is generally detectable in upper and lower respiratory specimens during the acute phase of infection. Negative results do not preclude SARS-CoV-2 infection, do not rule out co-infections with other pathogens, and should not be used as the sole basis for treatment or other patient management decisions. Negative results must be combined with clinical observations, patient history, and epidemiological information. The expected result is Negative. Fact Sheet for Patients: SugarRoll.be Fact Sheet for Healthcare Providers: https://www.woods-mathews.com/ This test is not yet approved or cleared by the [place] FDA and  has been authorized for detection and/or diagnosis of SARS-CoV-2 by FDA under an Emergency Use Authorization (EUA). This EUA will remain  in effect (meaning this test can be used) for the duration of the COVID-19 declaration under Section 56 4(b)(1) of the Act, 21 U.S.C. section 360bbb-3(b)(1), unless the authorization is terminated or revoked sooner. Performed at [facility], [place] [address] [place], [place] [address]      Labs: BNP (last 3 results) No results for input(s): BNP in the last 8760 hours. Basic Metabolic Panel: Recent Labs  Lab 08/26/19 0649  CREATININE 0.56*   Liver Function Tests: No results for input(s): AST, ALT, ALKPHOS, BILITOT, PROT, ALBUMIN in the last 168 hours. No  results for input(s): LIPASE, AMYLASE in the last 168 hours. No results for input(s): AMMONIA in the last 168 hours. CBC: No results for input(s): WBC, NEUTROABS, HGB, HCT, MCV, PLT in the last 168 hours. Cardiac Enzymes: No results for input(s): CKTOTAL, CKMB, CKMBINDEX, TROPONINI in the last 168 hours. BNP: Invalid input(s): POCBNP CBG: No results for input(s): GLUCAP in the last 168 hours. D-Dimer No results for  input(s): DDIMER in the last 72 hours. Hgb A1c No results for input(s): HGBA1C in the last 72 hours. Lipid Profile No results for input(s): CHOL, HDL, LDLCALC, TRIG, CHOLHDL, LDLDIRECT in the last 72 hours. Thyroid function studies No results for input(s): TSH, T4TOTAL, T3FREE, THYROIDAB in the last 72 hours.  Invalid input(s): FREET3 Anemia work up No results for input(s): VITAMINB12, FOLATE, FERRITIN, TIBC, IRON, RETICCTPCT in the last 72 hours. Urinalysis    Component Value Date/Time   COLORURINE YELLOW (A) 08/21/2019 1707   APPEARANCEUR CLEAR (A) 08/21/2019 1707   LABSPEC 1.020 08/21/2019 1707   PHURINE 6.0 08/21/2019 1707   GLUCOSEU NEGATIVE 08/21/2019 1707   GLUCOSEU NEGATIVE 07/02/2018 1025   HGBUR SMALL (A) 08/21/2019 1707   BILIRUBINUR NEGATIVE 08/21/2019 1707   KETONESUR 5 (A) 08/21/2019 1707   PROTEINUR 30 (A) 08/21/2019 1707   UROBILINOGEN 0.2 07/02/2018 1025   NITRITE NEGATIVE 08/21/2019 1707   LEUKOCYTESUR MODERATE (A) 08/21/2019 1707   Sepsis Labs Invalid input(s): PROCALCITONIN,  WBC,  LACTICIDVEN Microbiology Recent Results (from the past 240 hour(s))  Urine culture     Status: None   Collection Time: 08/21/19  5:07 PM   Specimen: Urine, Clean Catch  Result Value Ref Range Status   Specimen Description   Final    URINE, CLEAN CATCH Performed at [facility], [address] [place], [place] [address]    Special Requests   Final    Normal Performed at [facility], [address] [place], [place] [address]    Culture   Final    NO GROWTH Performed at [facility], [place] [address], [place], [place] [address]    Report Status 08/23/2019 FINAL  Final  SARS CORONAVIRUS 2 (TAT 6-24 HRS) Nasopharyngeal Nasopharyngeal Swab     Status: None   Collection Time: 08/21/19  6:45 PM   Specimen: Nasopharyngeal Swab  Result Value Ref Range Status   SARS Coronavirus 2 NEGATIVE NEGATIVE Final    Comment: (NOTE) SARS-CoV-2 target nucleic  acids are NOT DETECTED. The SARS-CoV-2 RNA is generally detectable in upper and lower respiratory specimens during the acute phase of infection. Negative results do not preclude SARS-CoV-2 infection, do not rule out co-infections with other pathogens, and should not be used as the sole basis for treatment or other patient management decisions. Negative results must be combined with clinical observations, patient history, and epidemiological information. The expected result is Negative. Fact Sheet for Patients: SugarRoll.be Fact Sheet for Healthcare Providers: https://www.woods-mathews.com/ This test is not yet approved or cleared by the [place] FDA and  has been authorized for detection and/or diagnosis of SARS-CoV-2 by FDA under an Emergency Use Authorization (EUA). This EUA will remain  in effect (meaning this test can be used) for the duration of the COVID-19 declaration under Section 56 4(b)(1) of the Act, 21 U.S.C. section 360bbb-3(b)(1), unless the authorization is terminated or revoked sooner. Performed at [facility], [place] [address], [place], [place] [address]   MRSA PCR Screening     Status: None   Collection Time:  08/22/19 10:55 PM   Specimen: Nasopharyngeal  Result Value Ref Range Status   MRSA by PCR NEGATIVE NEGATIVE Final    Comment:        The GeneXpert MRSA Assay (FDA approved for NASAL specimens only), is one component of a comprehensive MRSA colonization surveillance program. It is not intended to diagnose MRSA infection nor to guide or monitor treatment for MRSA infections. Performed at [facility], [place], [place] [address]   SARS CORONAVIRUS 2 (TAT 6-24 HRS) Nasopharyngeal Nasopharyngeal Swab     Status: None   Collection Time: 08/25/19  7:00 PM   Specimen: Nasopharyngeal Swab  Result Value Ref Range Status   SARS Coronavirus 2 NEGATIVE NEGATIVE Final    Comment:  (NOTE) SARS-CoV-2 target nucleic acids are NOT DETECTED. The SARS-CoV-2 RNA is generally detectable in upper and lower respiratory specimens during the acute phase of infection. Negative results do not preclude SARS-CoV-2 infection, do not rule out co-infections with other pathogens, and should not be used as the sole basis for treatment or other patient management decisions. Negative results must be combined with clinical observations, patient history, and epidemiological information. The expected result is Negative. Fact Sheet for Patients: SugarRoll.be Fact Sheet for Healthcare Providers: https://www.woods-mathews.com/ This test is not yet approved or cleared by the [place] FDA and  has been authorized for detection and/or diagnosis of SARS-CoV-2 by FDA under an Emergency Use Authorization (EUA). This EUA will remain  in effect (meaning this test can be used) for the duration of the COVID-19 declaration under Section 56 4(b)(1) of the Act, 21 U.S.C. section 360bbb-3(b)(1), unless the authorization is terminated or revoked sooner. Performed at [facility], [place] [address], [place], [place] [address]      Time coordinating discharge: Over 30 minutes  SIGNED:   Nolberto Hanlon, MD  [facility] 08/31/2019, 11:54 AM Pager   If 7PM-7AM, please contact night-coverage [email or web address] Password [email or web address]

## 2019-08-31 NOTE — Progress Notes (Signed)
   08/31/19 1000  Clinical Encounter Type  Visited With Patient and family together  Visit Type Initial;Spiritual support  Stress Factors  Patient Stress Factors Major life changes  Family Stress Factors Loss of control;Major life changes  Ch was rounding. Pt was in bed seemingly without any physical distress and Daughter was at bedside. Daughter was dealing with shock and grief from seeing the pt who is emaciated from not eating and drinking for a long period of time. It has been a week since she lost saw the pt and she notices a major change in pt's conditions. She kept saying that her mother wouldn't be able to take this well. Daughter also shared her grief of losing the elderly generation of her family. Daughter is calm yet experiencing anticipatory grief. Ch provided a listening ear and gave space for the daughter to debrief her experience. Ch informed the daughter that she can reach out to a ch anytime. The visit was appreciated.

## 2019-08-31 NOTE — Progress Notes (Signed)
Follow up visit made to new referral for [facility] hospice home. Patient lying in bed, appeared restless, daughter Augusto Garbe at bedside. She remains agreeable for her father to transfer to the hospice home today.  Discussed symptom management with staff RN Andee Poles, she will give liquid morphine. IV access was lost overnight. Patient currently only has IV or IM lorazepam, request made to attending for order for liquid lorazepam.  Plan is for transfer to the hospice home today at 5 pm, via EMS. Writer to call report and set up transport. Hospital care team and [facility] aware. Will continue to follow through discharge. Flo Shanks BSN, RN, [place] [phone]

## 2019-08-31 NOTE — Care Management Important Message (Signed)
Important Message  Patient Details  Name: Carlos Hayden. MRN: [id number] Date of Birth: 1925/12/09   Medicare Important Message Given:  Other (see comment)  Patient on comfort measures and awaiting a bed at the hospice home.  Out of respect for patient no Important Message given.    Juliann Pulse A Ervie Mccard 08/31/2019, 11:37 AM

## 2019-10-02 DEATH — deceased

## 2020-08-27 IMAGING — CT CT HEAD W/O CM
3 series · 15 of 47 positions shown, 18 images · non-contrast
Comparison: 04/22/2019

CLINICAL DATA: Question of head and C-spine trauma.

EXAM:
CT HEAD WITHOUT CONTRAST
CT CERVICAL SPINE WITHOUT CONTRAST
TECHNIQUE: Multidetector CT imaging of the head and cervical spine was
performed following the standard protocol without intravenous
contrast. Multiplanar CT image reconstructions of the cervical spine
were also generated.

[Series 3: head wo · axial · 0.41mm/px · z∈[-167,-42]mm · 9 of 31 slices shown, 12 images]
[im 3/31  brain]
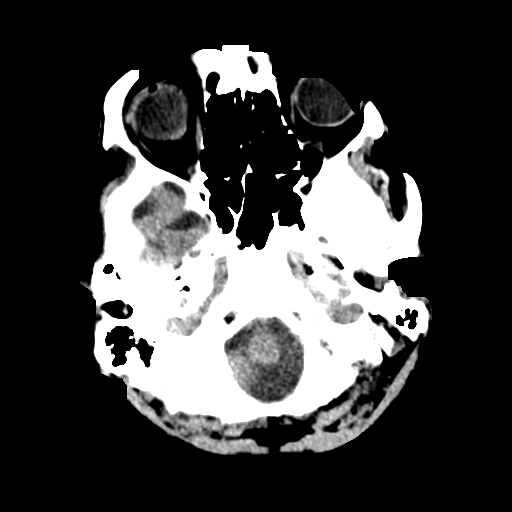
[im 3/31  bone]
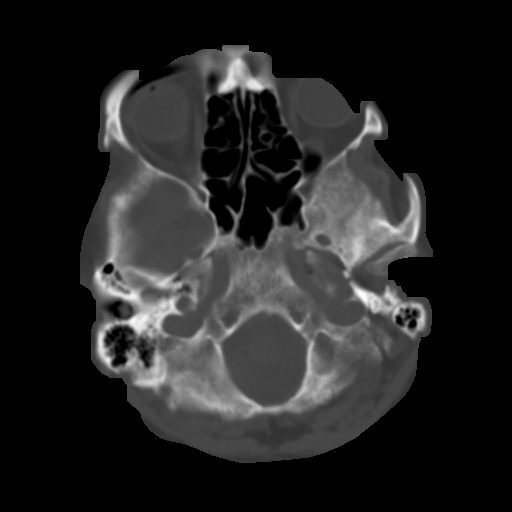
[im 6/31  brain]
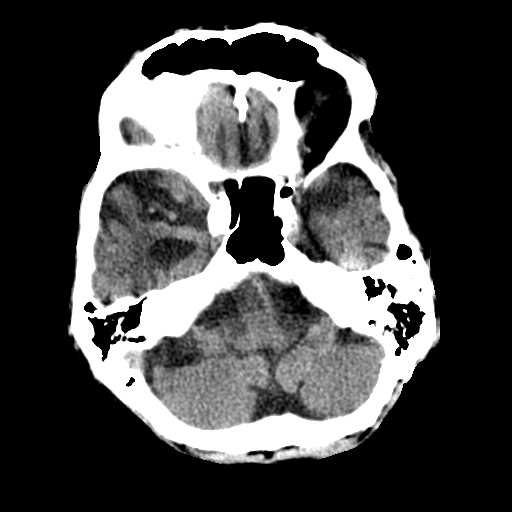
[im 9/31  brain]
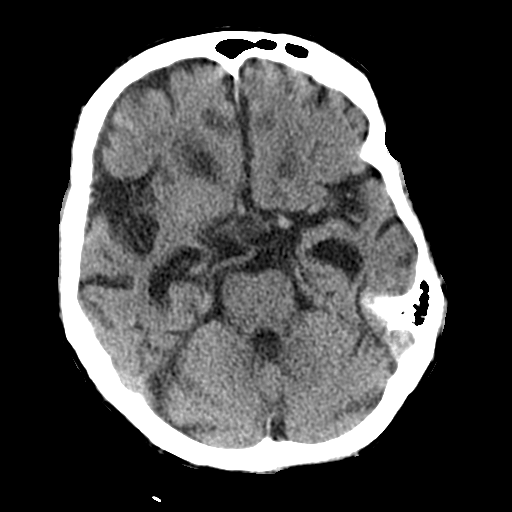
[im 12/31  brain]
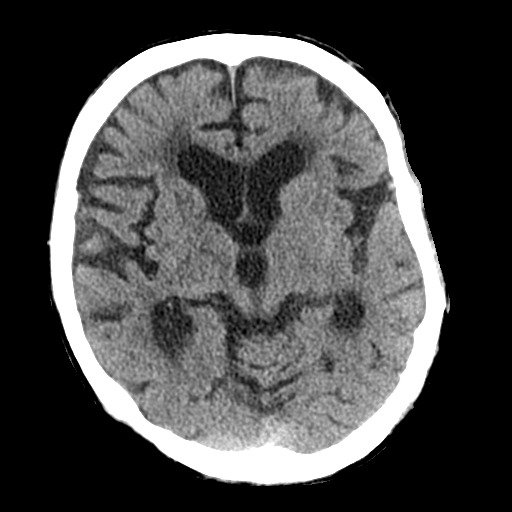
[im 16/31  brain]
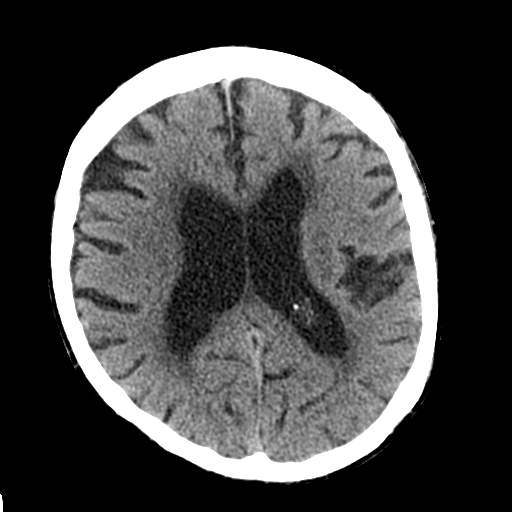
[im 16/31  bone]
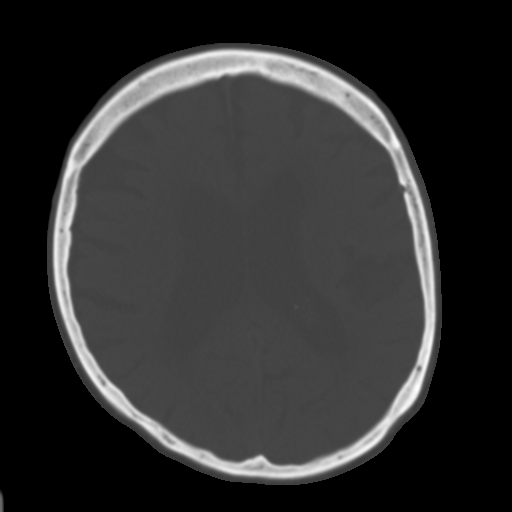
[im 19/31  brain]
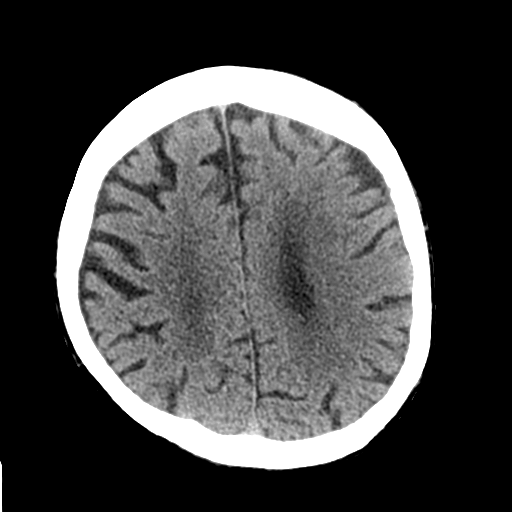
[im 22/31  brain]
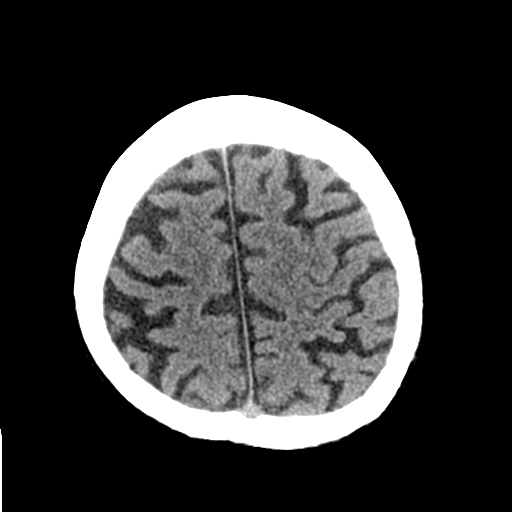
[im 25/31  brain]
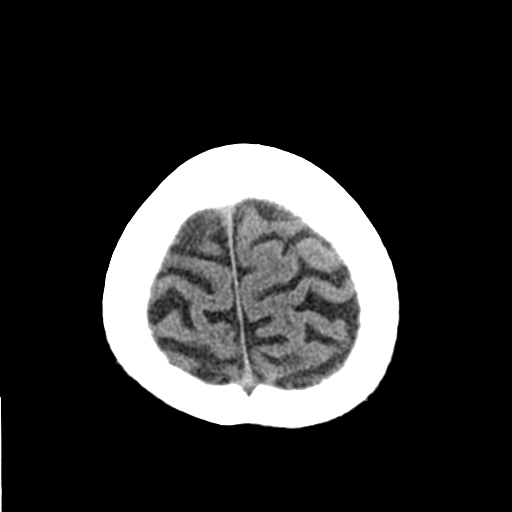
[im 28/31  brain]
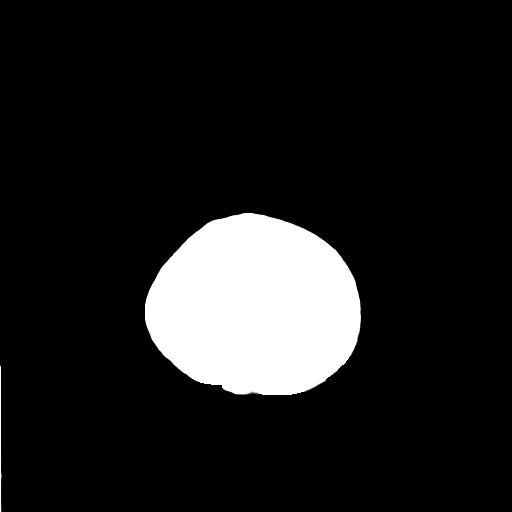
[im 28/31  bone]
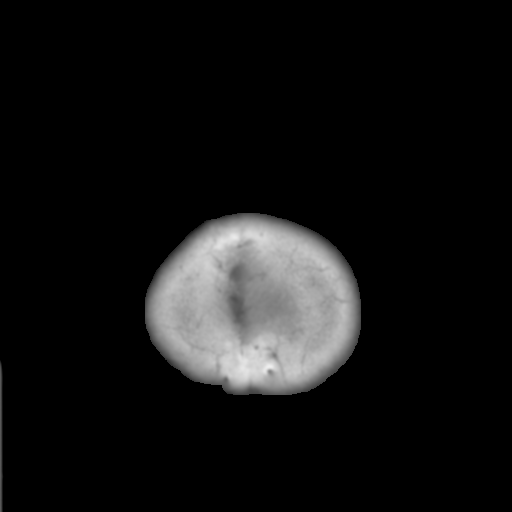

[Series 4: coronal soft tissue · coronal · 0.28mm/px · 3 of 66 slices shown]
[im 22/66  brain]
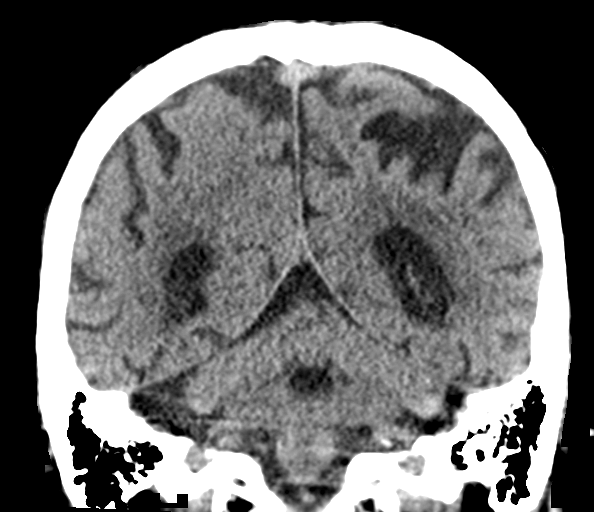
[im 29/66  brain]
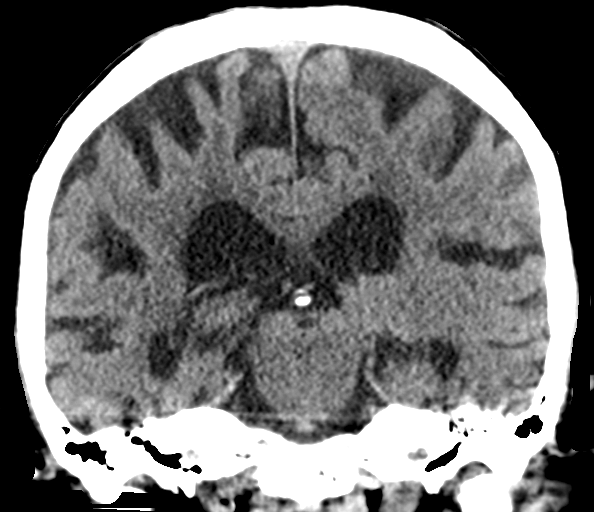
[im 37/66  brain]
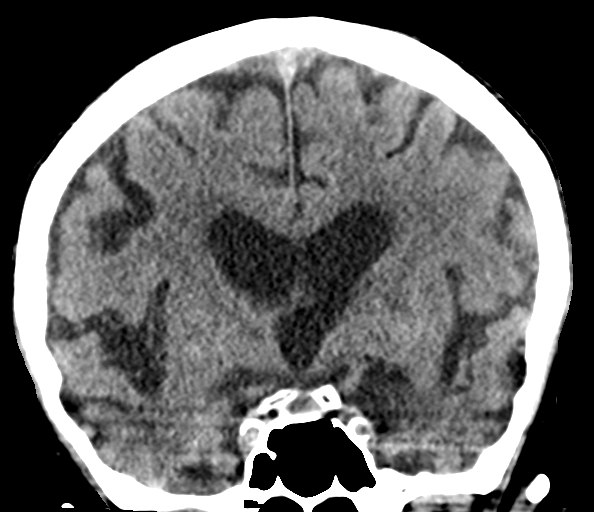

[Series 5: sagittal soft tissue · sagittal · 0.28mm/px · 3 of 56 slices shown]
[im 19/56  brain]
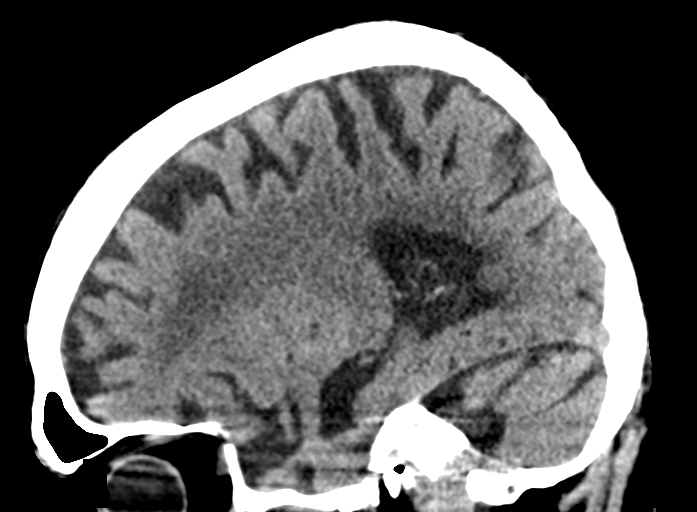
[im 28/56  brain]
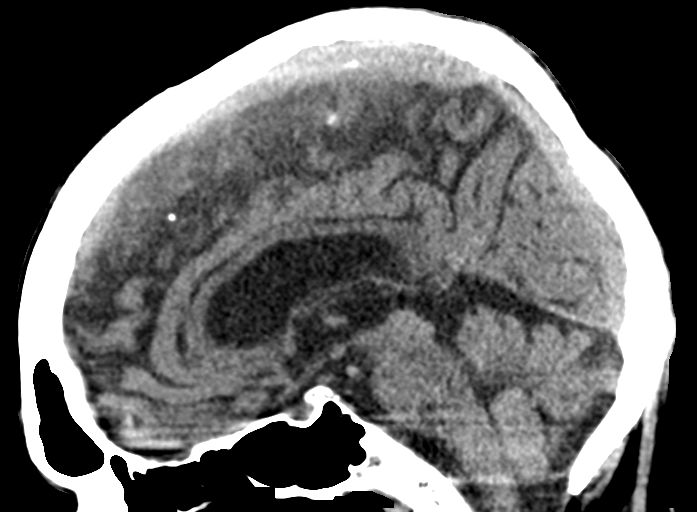
[im 37/56  brain]
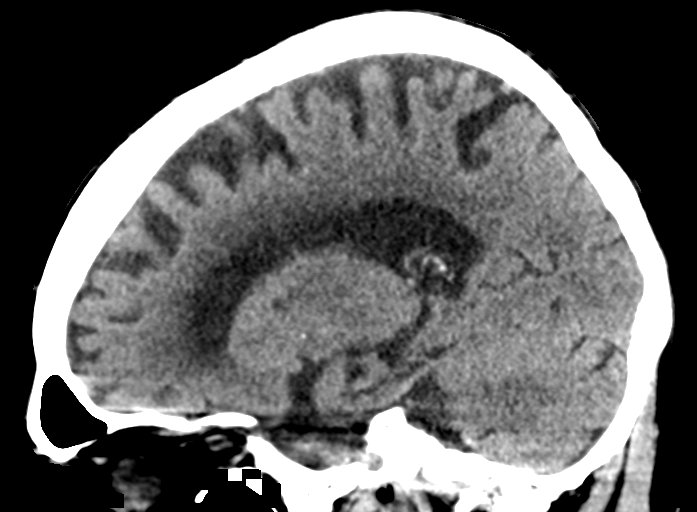

[15 of 47 positions shown; findings below may reference images not displayed]

FINDINGS: CT HEAD FINDINGS

Brain: No evidence of acute infarction, hemorrhage, hydrocephalus,
extra-axial collection or mass lesion/mass effect. Stable appearance
of generalized atrophy and chronic microvascular ischemic changes.

Vascular: No hyperdense vessel or unexpected calcification.

Skull: Normal. Negative for fracture or focal lesion.

Sinuses/Orbits: No acute finding.

Other: None.

CT CERVICAL SPINE FINDINGS

Alignment: Straightening of normal cervical lordosis likely
positional.

Skull base and vertebrae: No acute fracture. No primary bone lesion
or focal pathologic process.

Soft tissues and spinal canal: No prevertebral fluid or swelling. No
visible canal hematoma.

Disc levels: Multilevel degenerative change unchanged from previous
exam worse in the mid and lower cervical spine.

Upper chest: Biapical pleuroparenchymal scarring similar to prior
study.

Other: None
IMPRESSION: 1. Signs of atrophy and chronic microvascular ischemic change
without acute intracranial abnormality.
2. No fracture or traumatic malalignment in the cervical spine.
3. Extensive spinal degenerative changes as before.

## 2020-09-02 IMAGING — CR DG CHEST 2V
2 series · 2 of 2 positions shown · non-contrast
Comparison: 10/08/2017

CLINICAL DATA: Weakness

EXAM:
CHEST - 2 VIEW

[chest ap]
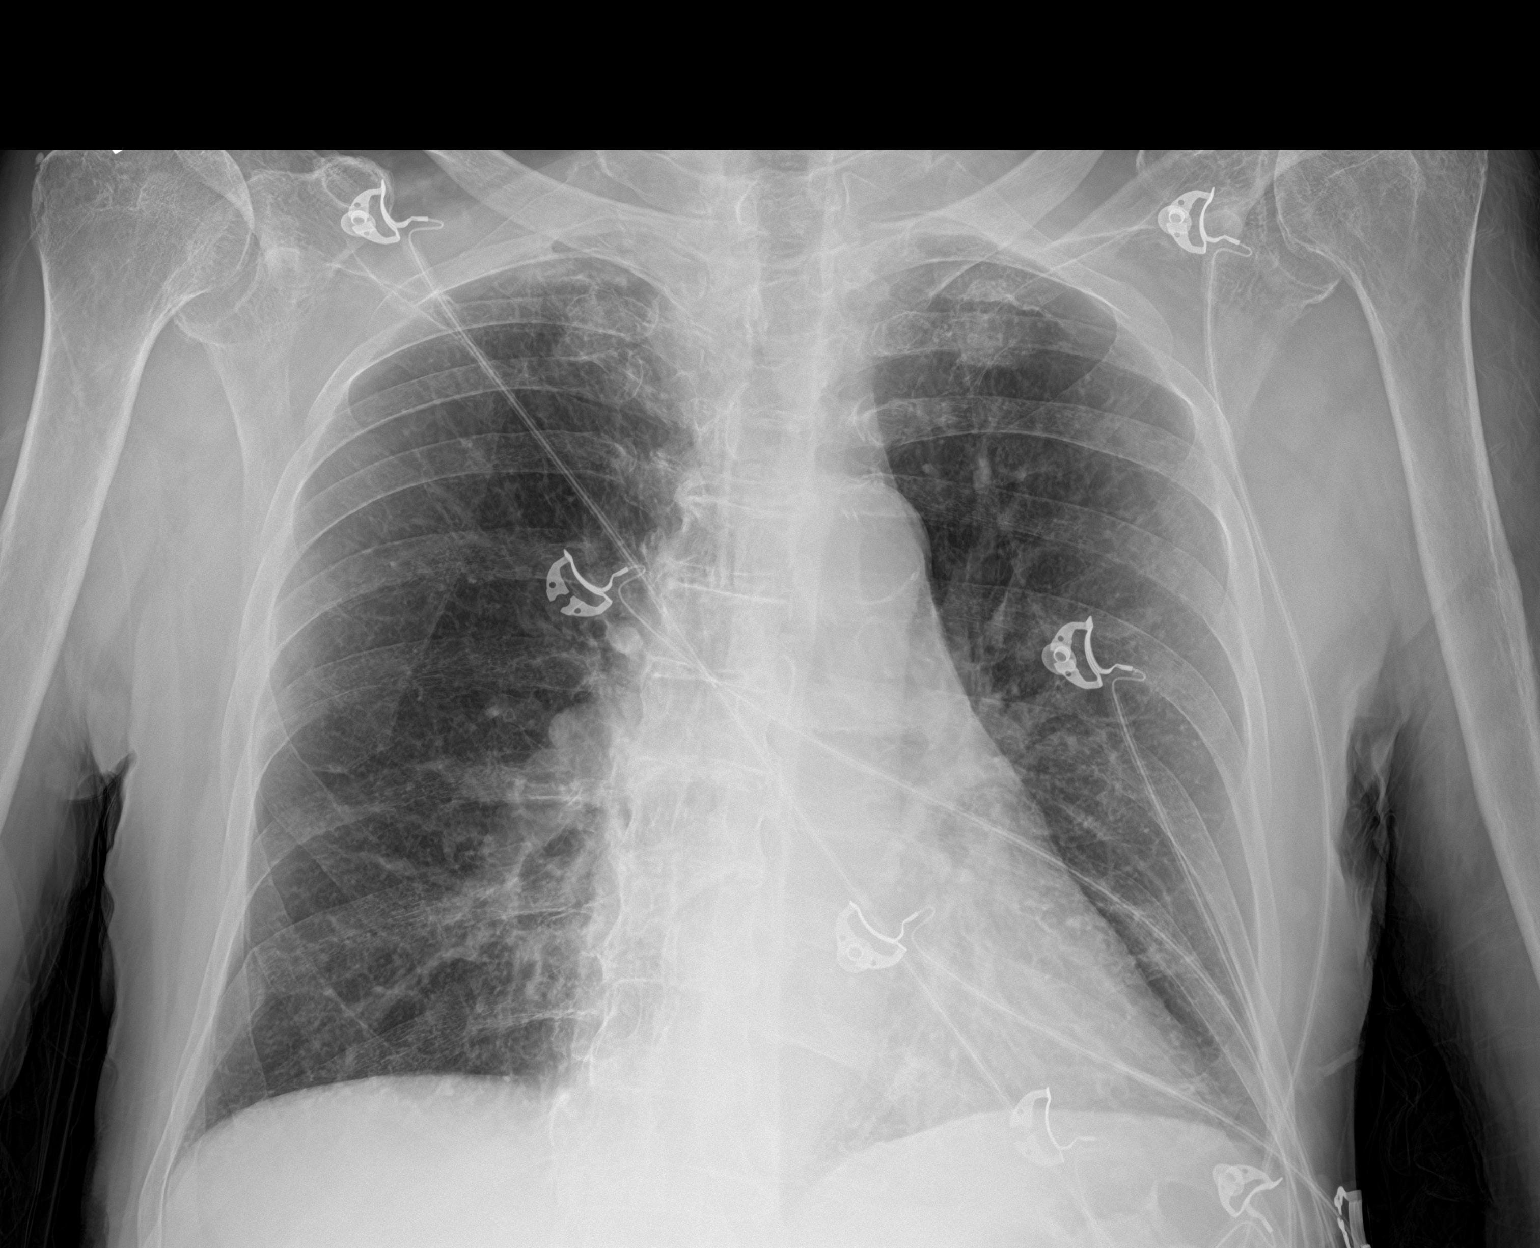

[chest lat]
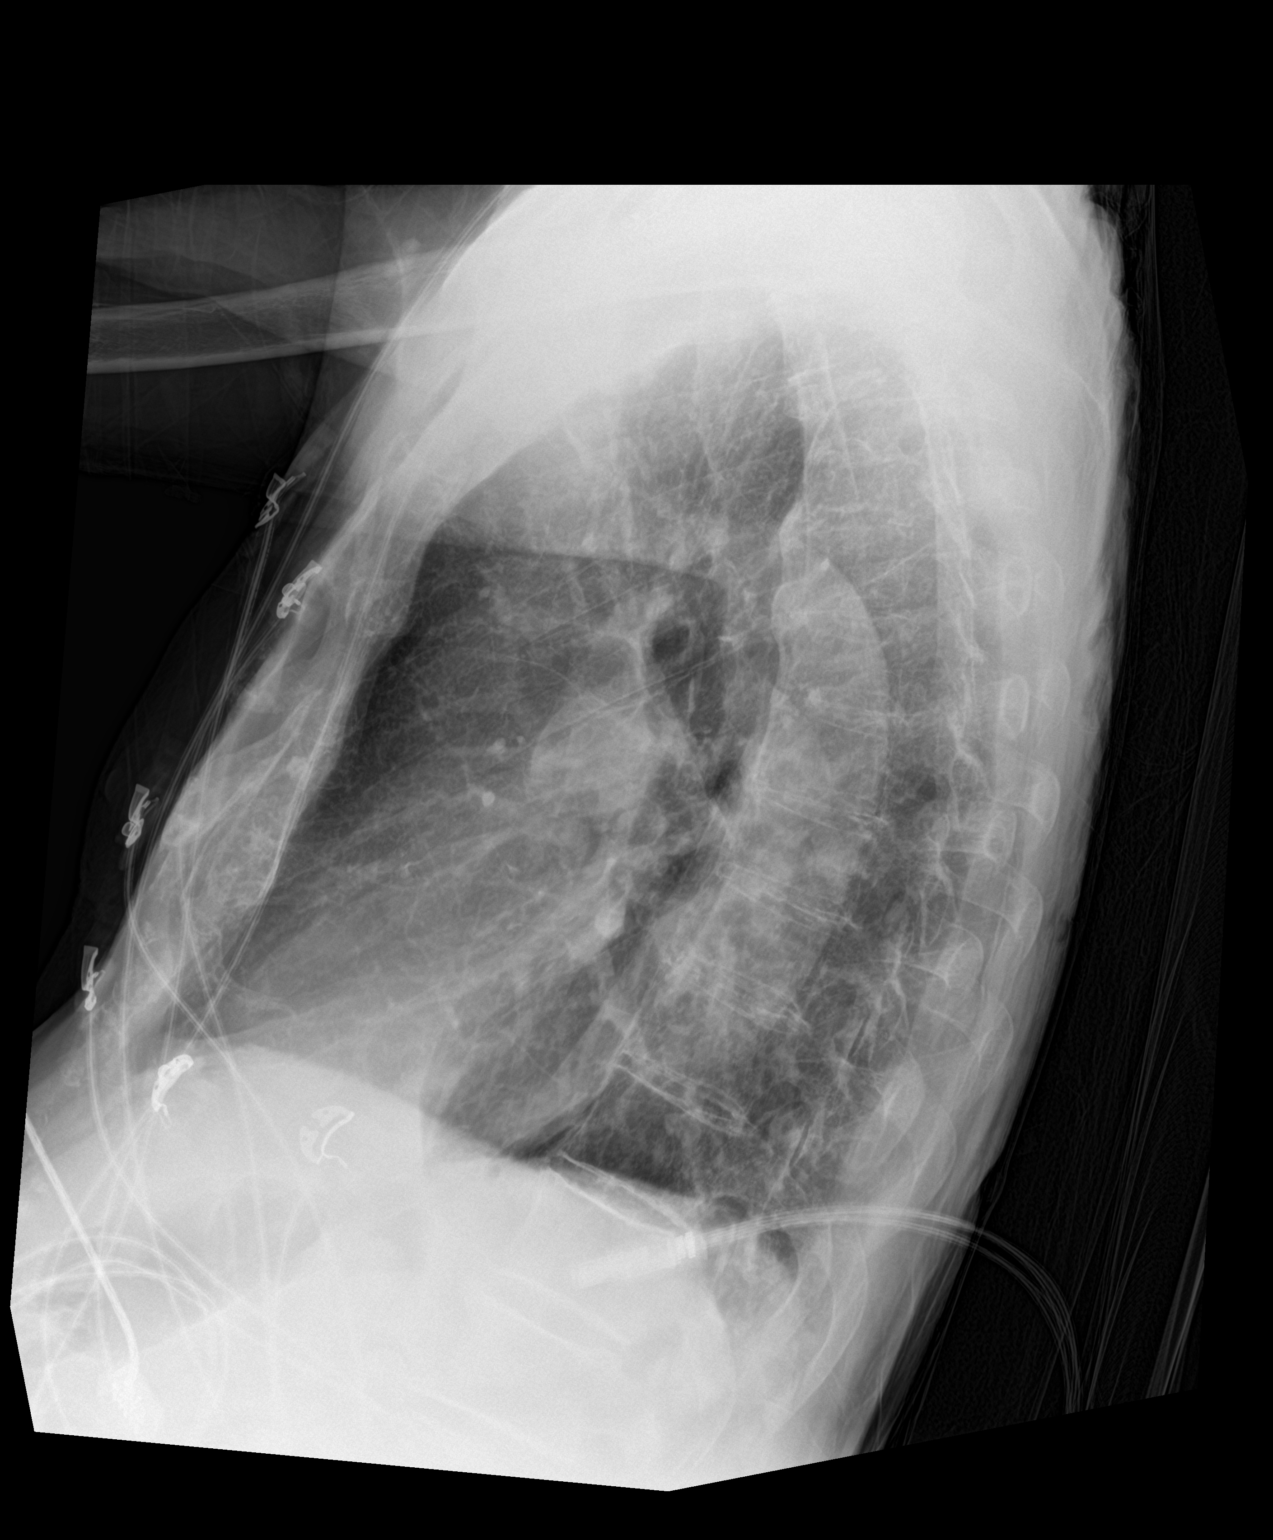

[2 of 2 positions shown; findings below may reference images not displayed]

FINDINGS: COPD with hyperinflation and scarring. No acute infiltrate or
effusion. Negative for heart failure. Atherosclerotic calcification
aortic arch.
IMPRESSION: COPD without acute cardiopulmonary abnormality.

## 2020-09-02 IMAGING — US US AORTA SCREENING (MEDICARE)
1 series · 14 of 25 positions shown · non-contrast
Comparison: CT abdomen pelvis, 06/26/2017.

CLINICAL DATA: Pulsatile abdominal mass.

EXAM:
US ABDOMINAL AORTA MEDICARE SCREENING
TECHNIQUE: Ultrasound examination of the abdominal aorta was performed as a
screening evaluation for abdominal aortic aneurysm.

[Series 1: us aorta screening (medicare) · 14 of 28 slices shown]
[im 1/28]
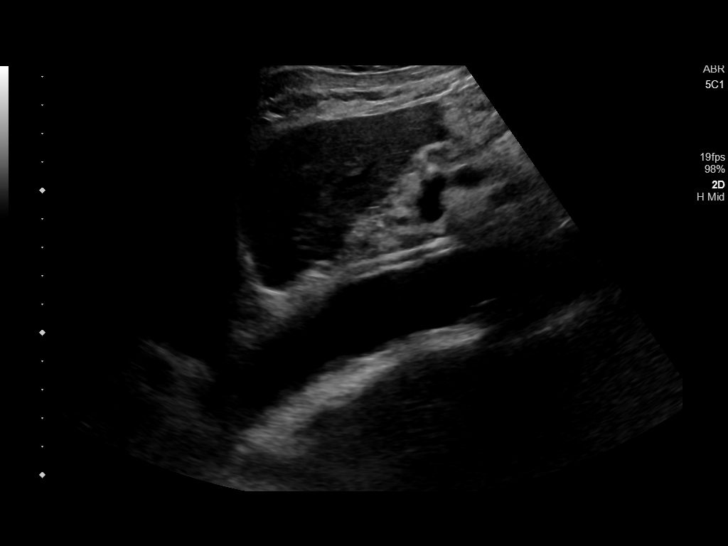
[im 3/28]
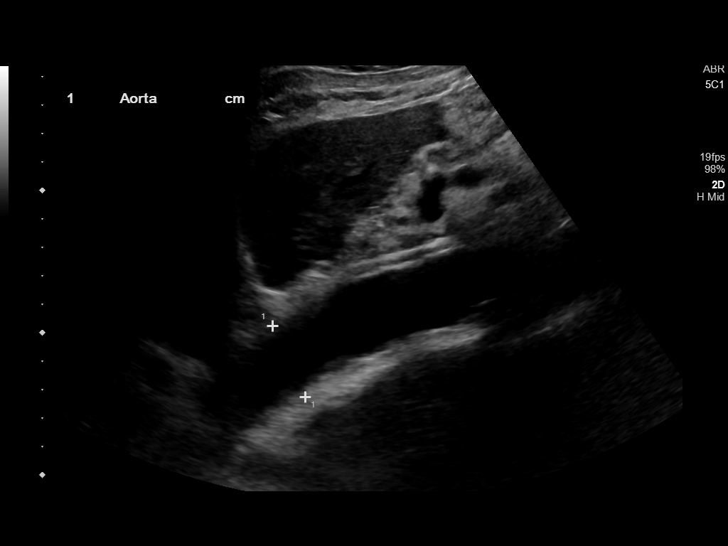
[im 5/28]
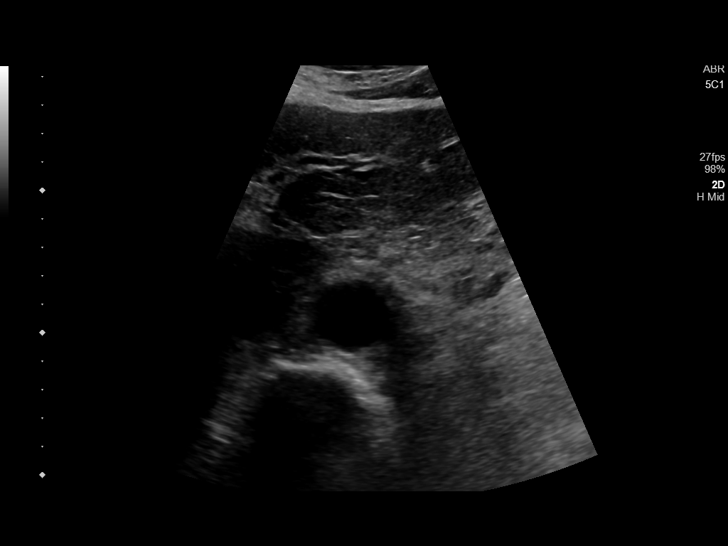
[im 7/28]
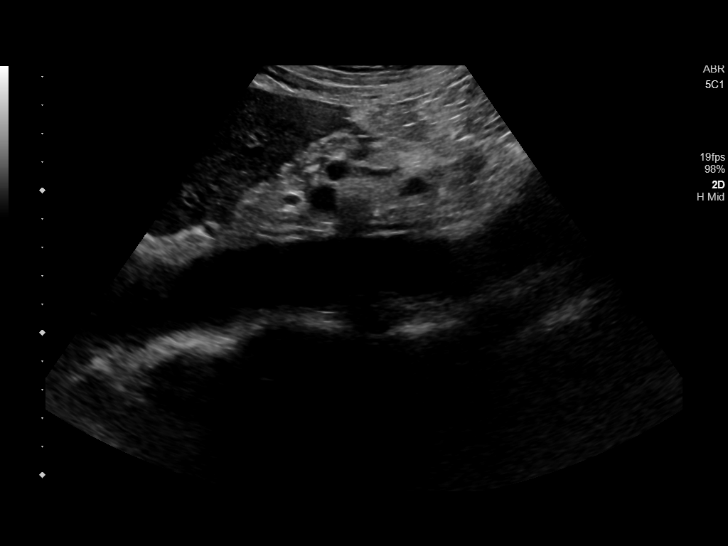
[im 10/28]
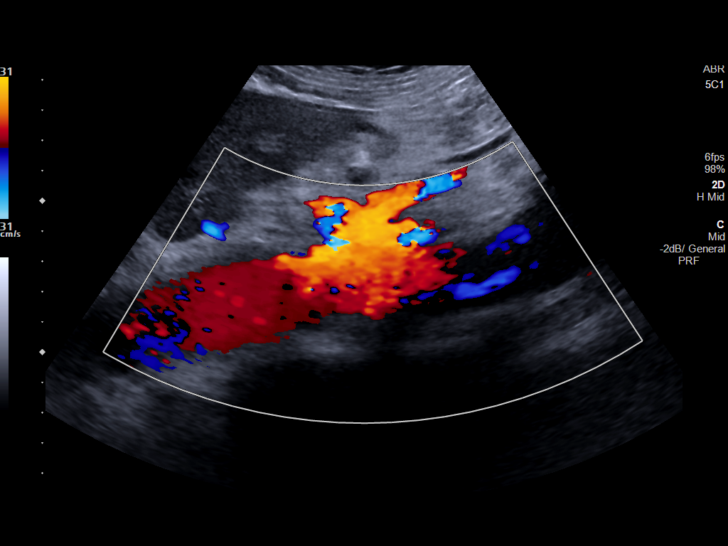
[im 11/28]
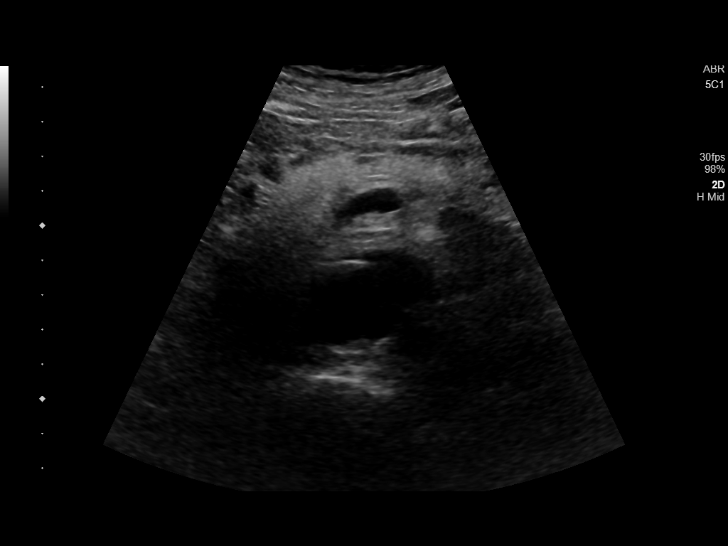
[im 13/28]
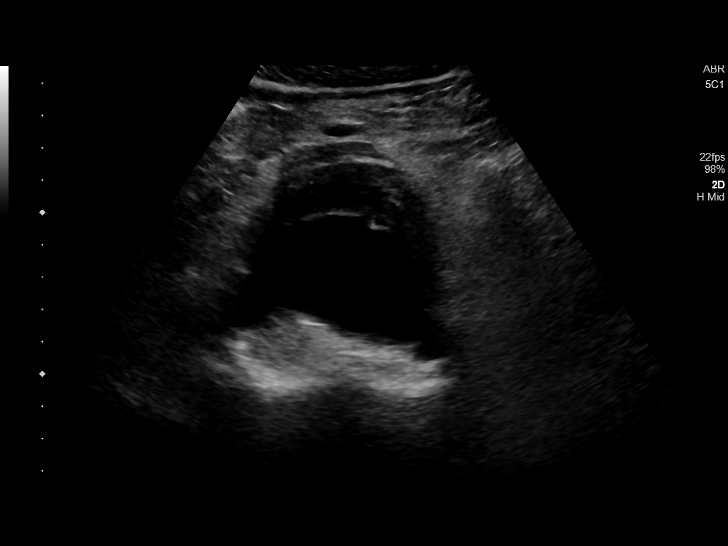
[im 15/28]
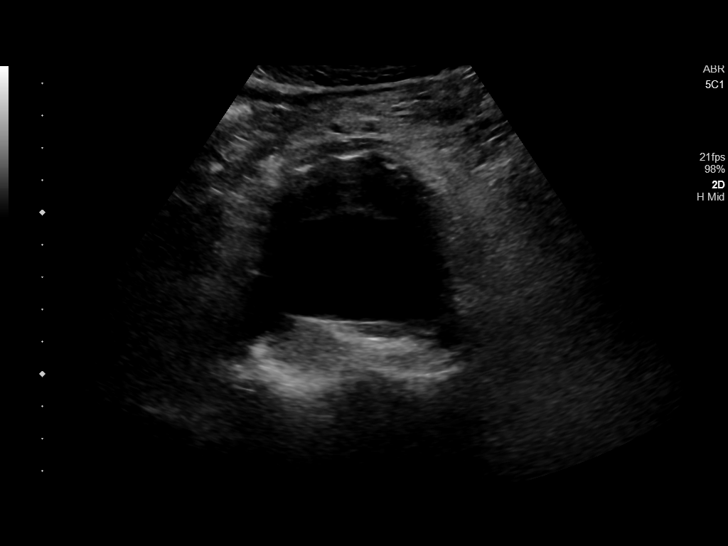
[im 17/28]
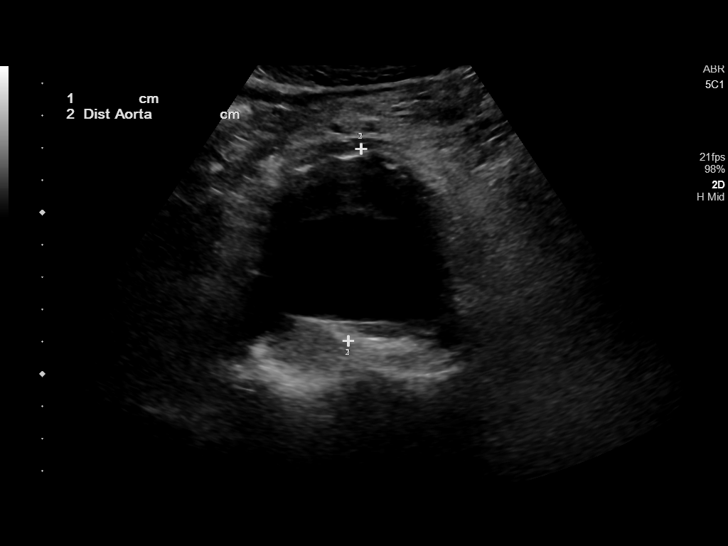
[im 19/28]
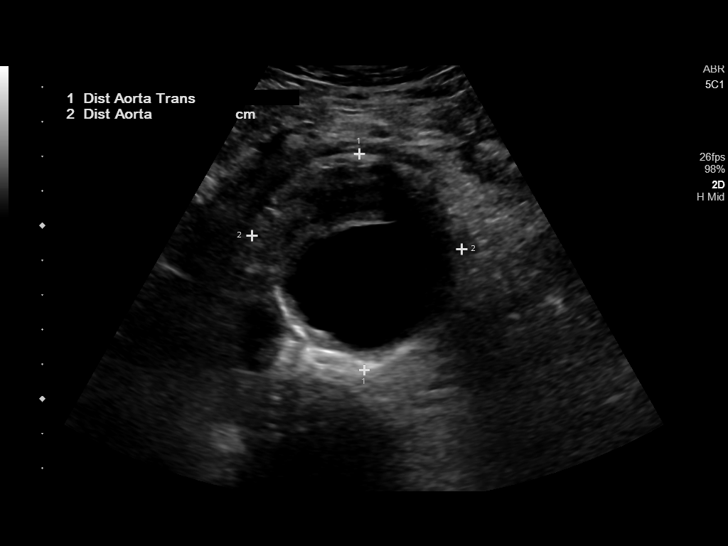
[im 21/28]
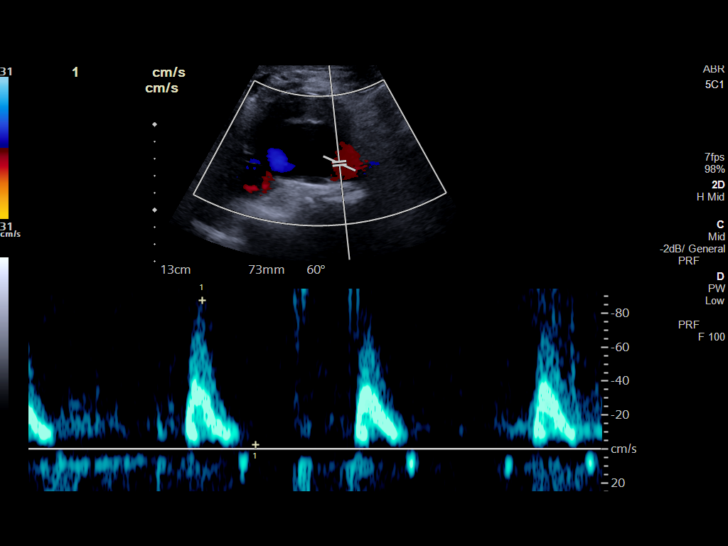
[im 23/28]
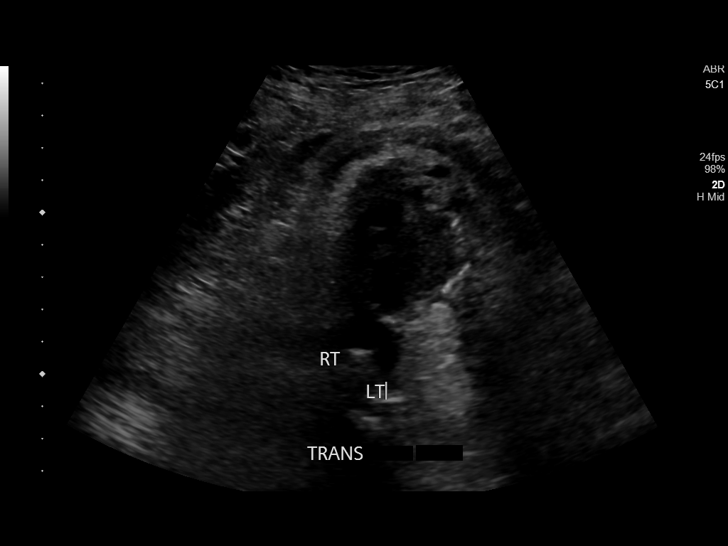
[im 25/28]
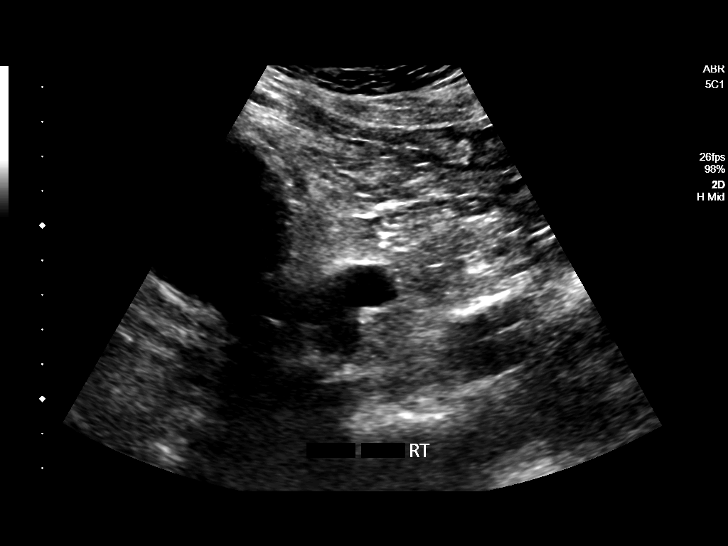
[im 28/28]
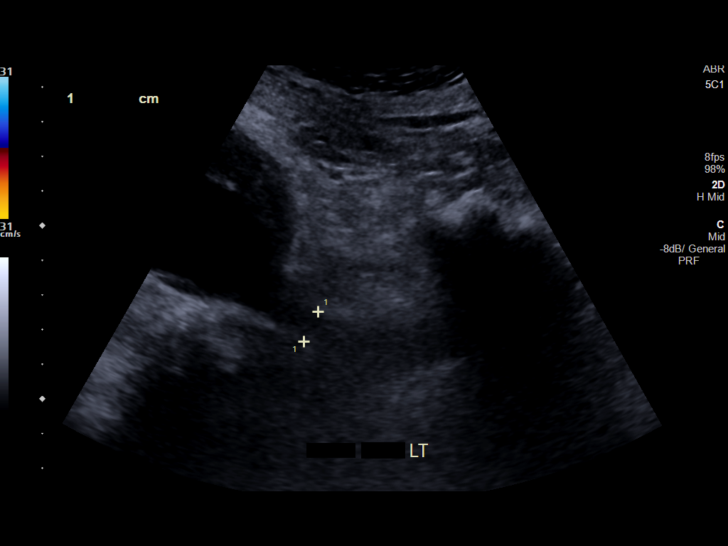

[14 of 25 positions shown; findings below may reference images not displayed]

FINDINGS: Abdominal aortic measurements as follows: (AP x transverse )

Proximal:  2.9 x 2.7 cm

Mid:  2.7 x 2.7 cm

Distal:  6.2 x 6.1 cm
IMPRESSION: 1. 6.2 cm infrarenal abdominal aortic aneurysm, increased from 5 cm
from the prior abdomen and pelvis CT. No sonographic evidence of
rupture. Mural thrombus noted along the anterior aspect of the
aneurysm. Vascular surgery consultation recommended due to increased
risk of rupture for AAA >5.5 cm. This recommendation follows ACR
consensus guidelines: White Paper of the ACR Incidental Findings
Committee II on Vascular Findings. [HOSPITAL] [id number];
[DATE]. Aortic aneurysm NOS ([id number])
